# Patient Record
Sex: Female | Born: 1980 | Race: White | Hispanic: No | Marital: Married | Smoking: Former smoker
Health system: Southern US, Community
[De-identification: ages and names within clinical notes are randomized; demographics above are authoritative.]

## PROBLEM LIST (undated history)

## (undated) ENCOUNTER — Emergency Department (HOSPITAL_COMMUNITY)

## (undated) DIAGNOSIS — D649 Anemia, unspecified: Secondary | ICD-10-CM

## (undated) DIAGNOSIS — G709 Myoneural disorder, unspecified: Secondary | ICD-10-CM

## (undated) DIAGNOSIS — I10 Essential (primary) hypertension: Secondary | ICD-10-CM

## (undated) DIAGNOSIS — R32 Unspecified urinary incontinence: Secondary | ICD-10-CM

## (undated) DIAGNOSIS — E041 Nontoxic single thyroid nodule: Secondary | ICD-10-CM

## (undated) DIAGNOSIS — G35 Multiple sclerosis: Secondary | ICD-10-CM

## (undated) DIAGNOSIS — G2581 Restless legs syndrome: Secondary | ICD-10-CM

## (undated) DIAGNOSIS — R112 Nausea with vomiting, unspecified: Secondary | ICD-10-CM

## (undated) DIAGNOSIS — I639 Cerebral infarction, unspecified: Secondary | ICD-10-CM

## (undated) DIAGNOSIS — E039 Hypothyroidism, unspecified: Secondary | ICD-10-CM

## (undated) DIAGNOSIS — R7303 Prediabetes: Secondary | ICD-10-CM

## (undated) DIAGNOSIS — R51 Headache: Secondary | ICD-10-CM

## (undated) DIAGNOSIS — Z9889 Other specified postprocedural states: Secondary | ICD-10-CM

## (undated) DIAGNOSIS — F419 Anxiety disorder, unspecified: Secondary | ICD-10-CM

## (undated) HISTORY — PX: TYMPANOPLASTY: SHX33

## (undated) HISTORY — PX: TONSILLECTOMY: SUR1361

## (undated) HISTORY — PX: ADENOIDECTOMY: SUR15

---
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
## 2002-03-06 ENCOUNTER — Other Ambulatory Visit: Admission: RE | Admit: 2002-03-06 | Discharge: 2002-03-06 | Payer: Self-pay | Admitting: *Deleted

## 2003-10-11 ENCOUNTER — Emergency Department (HOSPITAL_COMMUNITY): Admission: EM | Admit: 2003-10-11 | Discharge: 2003-10-11 | Payer: Self-pay | Admitting: Emergency Medicine

## 2004-08-13 ENCOUNTER — Ambulatory Visit (HOSPITAL_COMMUNITY): Admission: RE | Admit: 2004-08-13 | Discharge: 2004-08-13 | Payer: Self-pay | Admitting: *Deleted

## 2004-08-13 ENCOUNTER — Ambulatory Visit (HOSPITAL_COMMUNITY): Admission: AD | Admit: 2004-08-13 | Discharge: 2004-08-13 | Payer: Self-pay | Admitting: Obstetrics & Gynecology

## 2004-08-29 ENCOUNTER — Ambulatory Visit (HOSPITAL_COMMUNITY): Admission: AD | Admit: 2004-08-29 | Discharge: 2004-08-29 | Payer: Self-pay | Admitting: Obstetrics & Gynecology

## 2004-09-12 ENCOUNTER — Inpatient Hospital Stay (HOSPITAL_COMMUNITY): Admission: RE | Admit: 2004-09-12 | Discharge: 2004-09-14 | Payer: Self-pay | Admitting: Obstetrics & Gynecology

## 2005-02-07 ENCOUNTER — Other Ambulatory Visit: Admission: RE | Admit: 2005-02-07 | Discharge: 2005-02-07 | Payer: Self-pay | Admitting: Obstetrics and Gynecology

## 2006-08-19 ENCOUNTER — Inpatient Hospital Stay (HOSPITAL_COMMUNITY): Admission: RE | Admit: 2006-08-19 | Discharge: 2006-08-21 | Payer: Self-pay | Admitting: Obstetrics and Gynecology

## 2010-06-22 ENCOUNTER — Other Ambulatory Visit (HOSPITAL_COMMUNITY): Payer: Self-pay | Admitting: Family Medicine

## 2010-06-22 DIAGNOSIS — R110 Nausea: Secondary | ICD-10-CM

## 2010-06-22 DIAGNOSIS — R51 Headache: Secondary | ICD-10-CM

## 2010-06-28 ENCOUNTER — Other Ambulatory Visit (HOSPITAL_COMMUNITY): Payer: Self-pay

## 2010-06-28 ENCOUNTER — Ambulatory Visit (HOSPITAL_COMMUNITY)
Admission: RE | Admit: 2010-06-28 | Discharge: 2010-06-28 | Disposition: A | Discharge status: Home / Self Care | Payer: 59 | Source: Ambulatory Visit | Attending: Family Medicine | Admitting: Family Medicine

## 2010-06-28 DIAGNOSIS — R51 Headache: Secondary | ICD-10-CM

## 2010-06-28 DIAGNOSIS — R110 Nausea: Secondary | ICD-10-CM | POA: Insufficient documentation

## 2010-07-02 ENCOUNTER — Emergency Department (HOSPITAL_COMMUNITY)
Admission: EM | Admit: 2010-07-02 | Discharge: 2010-07-02 | Disposition: A | Discharge status: Home / Self Care | Payer: 59 | Attending: Emergency Medicine | Admitting: Emergency Medicine

## 2010-07-02 DIAGNOSIS — R209 Unspecified disturbances of skin sensation: Secondary | ICD-10-CM | POA: Insufficient documentation

## 2010-07-02 DIAGNOSIS — G43909 Migraine, unspecified, not intractable, without status migrainosus: Secondary | ICD-10-CM | POA: Insufficient documentation

## 2010-07-02 DIAGNOSIS — I10 Essential (primary) hypertension: Secondary | ICD-10-CM | POA: Insufficient documentation

## 2010-07-02 DIAGNOSIS — Z79899 Other long term (current) drug therapy: Secondary | ICD-10-CM | POA: Insufficient documentation

## 2011-02-25 ENCOUNTER — Encounter: Payer: Self-pay | Admitting: Emergency Medicine

## 2011-02-25 ENCOUNTER — Emergency Department (HOSPITAL_COMMUNITY)
Admission: EM | Admit: 2011-02-25 | Discharge: 2011-02-25 | Disposition: A | Discharge status: Home / Self Care | Payer: 59 | Attending: Emergency Medicine | Admitting: Emergency Medicine

## 2011-02-25 ENCOUNTER — Emergency Department (HOSPITAL_COMMUNITY): Payer: 59

## 2011-02-25 DIAGNOSIS — Z9889 Other specified postprocedural states: Secondary | ICD-10-CM | POA: Insufficient documentation

## 2011-02-25 DIAGNOSIS — I10 Essential (primary) hypertension: Secondary | ICD-10-CM | POA: Insufficient documentation

## 2011-02-25 DIAGNOSIS — Z79899 Other long term (current) drug therapy: Secondary | ICD-10-CM | POA: Insufficient documentation

## 2011-02-25 DIAGNOSIS — R630 Anorexia: Secondary | ICD-10-CM | POA: Insufficient documentation

## 2011-02-25 DIAGNOSIS — R1013 Epigastric pain: Secondary | ICD-10-CM | POA: Insufficient documentation

## 2011-02-25 DIAGNOSIS — R10819 Abdominal tenderness, unspecified site: Secondary | ICD-10-CM | POA: Insufficient documentation

## 2011-02-25 HISTORY — DX: Essential (primary) hypertension: I10

## 2011-02-25 LAB — URINALYSIS, ROUTINE W REFLEX MICROSCOPIC
Bilirubin Urine: NEGATIVE
Glucose, UA: NEGATIVE mg/dL
Ketones, ur: NEGATIVE mg/dL
Nitrite: NEGATIVE
Protein, ur: NEGATIVE mg/dL
Specific Gravity, Urine: 1.01 (ref 1.005–1.030)
Urobilinogen, UA: 1 mg/dL (ref 0.0–1.0)
pH: 7.5 (ref 5.0–8.0)

## 2011-02-25 LAB — DIFFERENTIAL
Basophils Absolute: 0 10*3/uL (ref 0.0–0.1)
Eosinophils Relative: 2 % (ref 0–5)
Lymphocytes Relative: 24 % (ref 12–46)
Lymphs Abs: 1.2 10*3/uL (ref 0.7–4.0)
Neutrophils Relative %: 67 % (ref 43–77)

## 2011-02-25 LAB — COMPREHENSIVE METABOLIC PANEL
ALT: 11 U/L (ref 0–35)
AST: 16 U/L (ref 0–37)
Alkaline Phosphatase: 58 U/L (ref 39–117)
CO2: 27 mEq/L (ref 19–32)
Calcium: 9 mg/dL (ref 8.4–10.5)
Glucose, Bld: 88 mg/dL (ref 70–99)
Potassium: 3.9 mEq/L (ref 3.5–5.1)
Sodium: 140 mEq/L (ref 135–145)
Total Protein: 7.3 g/dL (ref 6.0–8.3)

## 2011-02-25 LAB — URINE MICROSCOPIC-ADD ON

## 2011-02-25 LAB — CBC
MCV: 85.5 fL (ref 78.0–100.0)
Platelets: 221 10*3/uL (ref 150–400)
RBC: 4.42 MIL/uL (ref 3.87–5.11)
RDW: 13.1 % (ref 11.5–15.5)
WBC: 5 10*3/uL (ref 4.0–10.5)

## 2011-02-25 LAB — POCT PREGNANCY, URINE: Preg Test, Ur: NEGATIVE

## 2011-02-25 MED ORDER — OMEPRAZOLE 20 MG PO CPDR: 20.0000 mg | DELAYED_RELEASE_CAPSULE | Freq: Every day | ORAL | Status: DC

## 2011-02-25 NOTE — ED Notes (Signed)
Pt reports abdominal pain x 1 month. Pt has been taking ibuprophen for migraines. Now pt has sensation of fullness in abdomen.

## 2011-02-25 NOTE — ED Provider Notes (Signed)
History     CSN: [id number] Arrival date & time: 02/25/2011 10:03 AM   First MD Initiated Contact with Patient 02/25/11 1135      Chief Complaint  Patient presents with  . Abdominal Pain    (Consider location/radiation/quality/duration/timing/severity/associated sxs/prior treatment) Patient is a 30 y.o. female presenting with abdominal pain. The history is provided by the patient.  Abdominal Pain The primary symptoms of the illness include abdominal pain. The primary symptoms of the illness do not include fever, fatigue, shortness of breath, nausea, vomiting, diarrhea, hematemesis, hematochezia, dysuria, vaginal discharge or vaginal bleeding. The current episode started more than 2 days ago. The problem has been gradually worsening.  Additional symptoms associated with the illness include anorexia. Symptoms associated with the illness do not include chills, diaphoresis, heartburn, urgency, hematuria, frequency or back pain. Significant associated medical issues do not include GERD.  Patient has had abdominal pain for approximately the last month. This seems to be located in the upper abdomen. It is described as a sharp sensation. It does not radiate. It is aggravated by lying down at night and is not changed with food consumption. She has not noticed any particular alleviating factors. She does seem to have a decrease in appetite.  She's had no accompanying nausea, vomiting, change in bowel movements, blood in stool, or rectal pain. She denies any fever, dysuria or vaginal discharge. She denies chest pain, shortness of breath, palpitations. She denies any chance that she may be pregnant, as her partner has had a vasectomy. She does not have any known history of GERD, but does recall having frequent heartburn while in the third trimester of her pregnancies. She states that her current symptoms don't really feel like heartburn to her. She denies any history of abdominal surgery other than  C-sections.  Past Medical History  Diagnosis Date  . Hypertension     Past Surgical History  Procedure Date  . Cesarean section   . Tympanoplasty     No family history on file.  History  Substance Use Topics  . Smoking status: Never Smoker   . Smokeless tobacco: Not on file  . Alcohol Use: Yes    OB History    Grav Para Term Preterm Abortions TAB SAB Ect Mult Living                  Review of Systems  Constitutional: Negative for fever, chills, diaphoresis and fatigue.  Respiratory: Negative for shortness of breath.   Gastrointestinal: Positive for abdominal pain and anorexia. Negative for heartburn, nausea, vomiting, diarrhea, hematochezia and hematemesis.  Genitourinary: Negative for dysuria, urgency, frequency, hematuria, vaginal bleeding and vaginal discharge.  Musculoskeletal: Negative for back pain.  ROS negative except as noted in HPI.  Allergies  Keflex and Penicillins  Home Medications   Current Outpatient Rx  Name Route Sig Dispense Refill  . ALPRAZOLAM 0.25 MG PO TABS Oral Take 0.25 mg by mouth daily as needed. For anxiety; patient takes along with Ibuprofen and Zofran at onset of migraine     . IBUPROFEN 800 MG PO TABS Oral Take 800 mg by mouth every 8 (eight) hours as needed. For pain; takes along with Xanax and Zofran at onset of migraine    . ONDANSETRON HCL 8 MG PO TABS Oral Take 8 mg by mouth every 8 (eight) hours as needed. For nausea; takes along with Ibuprofen and Xanax at onset of migraine      BP 127/82  Pulse 68  Temp(Src) 97 F (  36.1 C) (Oral)  SpO2 100%  LMP 02/21/2011  Physical Exam  Nursing note and vitals reviewed. Constitutional: She is oriented to person, place, and time. She appears well-developed and well-nourished. No distress.  HENT:  Head: Normocephalic and atraumatic.  Right Ear: External ear normal.  Left Ear: External ear normal.  Mouth/Throat: Oropharynx is clear and moist. No oropharyngeal exudate.  Eyes:  Conjunctivae and EOM are normal. Pupils are equal, round, and reactive to light.  Neck: Normal range of motion. Neck supple.  Cardiovascular: Normal rate, regular rhythm and normal heart sounds.   Pulmonary/Chest: Effort normal and breath sounds normal. She has no wheezes. She exhibits no tenderness.  Abdominal: Soft. Bowel sounds are normal. She exhibits no distension and no mass. There is tenderness. There is no rebound and no guarding.       Mild tenderness to epigastrium and LUQ.  Musculoskeletal: Normal range of motion.  Neurological: She is alert and oriented to person, place, and time.  Skin: Skin is warm and dry. No rash noted. She is not diaphoretic.  Psychiatric: She has a normal mood and affect.    ED Course  Procedures (including critical care time)  Labs Reviewed  URINALYSIS, ROUTINE W REFLEX MICROSCOPIC - Abnormal; Notable for the following:    Hgb urine dipstick LARGE (*)    Leukocytes, UA SMALL (*)    All other components within normal limits  POCT PREGNANCY, URINE  CBC  DIFFERENTIAL  COMPREHENSIVE METABOLIC PANEL  LIPASE, BLOOD  URINE MICROSCOPIC-ADD ON  LAB REPORT - SCANNED   US Abdomen Complete  02/25/2011  *RADIOLOGY REPORT*  Clinical Data:  Abdominal pain times 1 month, epigastric region  COMPLETE ABDOMINAL ULTRASOUND  Comparison:  None.  Findings:  Gallbladder:  No gallstones, gallbladder wall thickening, or pericholecystic fluid.  Negative sonographic Murphy's sign  Common bile duct:  Normal in size, measuring 4 mm in diameter  Liver:  Homogeneous hepatic echotexture.  No discrete lesions.  No ascites.  IVC:  Appears normal.  Pancreas:  No focal abnormality seen.  Spleen:  Normal in size measuring 10.4 cm in length.  Several small accessory splenules are incidentally noted.  Right Kidney:  Normal cortical thickness, echogenicity and size, measuring 11.6 cm in length.  No focal renal lesions.  No echogenic renal stones.  No urinary obstruction.  Left Kidney:   Normal cortical thickness, echogenicity and size, measuring 11 cm in length.  No focal renal lesions.  No echogenic renal stones.  No urinary obstruction.  Abdominal aorta:  No aneurysm identified.  IMPRESSION: Negative abdominal ultrasound.  Specifically, no evidence of cholelithiasis or urinary obstruction.  Original Report Authenticated By: Waynard Reeds, M.D.     1. Abdominal pain       MDM   Well-appearing female presents with one month of abdominal pain. The pain is located in the epigastrium. Labs performed today were largely unremarkable. An ultrasound was obtained to rule out gallbladder pathology, and was normal. Based on her presentation, I did not think a CT of the abdomen would add to the clinical picture at this time. I do not suspect an acute abdomen based on her presentation. Will treat for possible GERD with omeprazole. She was instructed to followup with her primary care physician for further evaluation and management. Patient verbalized understanding and agreed to plan.        Grant Fontana, [place] 02/26/11 2239

## 2011-02-25 NOTE — ED Notes (Signed)
To ultrasound

## 2011-02-25 NOTE — ED Notes (Signed)
Patient verbalized understanding of discharge instructions and is ambulatory on discharge

## 2011-02-27 NOTE — ED Provider Notes (Signed)
Medical screening examination/treatment/procedure(s) were performed by non-physician practitioner and as supervising physician I was immediately available for consultation/collaboration.   Gerhard Munch, MD 02/27/11 2155

## 2011-05-21 ENCOUNTER — Other Ambulatory Visit: Payer: Self-pay | Admitting: Family Medicine

## 2011-05-21 DIAGNOSIS — E049 Nontoxic goiter, unspecified: Secondary | ICD-10-CM

## 2011-05-22 ENCOUNTER — Ambulatory Visit (HOSPITAL_COMMUNITY)
Admission: RE | Admit: 2011-05-22 | Discharge: 2011-05-22 | Disposition: A | Discharge status: Home / Self Care | Payer: 59 | Source: Ambulatory Visit | Attending: Family Medicine | Admitting: Family Medicine

## 2011-05-22 DIAGNOSIS — E049 Nontoxic goiter, unspecified: Secondary | ICD-10-CM | POA: Insufficient documentation

## 2011-05-30 ENCOUNTER — Other Ambulatory Visit: Payer: Self-pay | Admitting: Family Medicine

## 2011-05-30 DIAGNOSIS — E041 Nontoxic single thyroid nodule: Secondary | ICD-10-CM

## 2011-06-05 ENCOUNTER — Other Ambulatory Visit (HOSPITAL_COMMUNITY)
Admission: RE | Admit: 2011-06-05 | Discharge: 2011-06-05 | Disposition: A | Discharge status: Home / Self Care | Payer: 59 | Source: Ambulatory Visit | Attending: Interventional Radiology | Admitting: Interventional Radiology

## 2011-06-05 ENCOUNTER — Ambulatory Visit
Admission: RE | Admit: 2011-06-05 | Discharge: 2011-06-05 | Disposition: A | Discharge status: Home / Self Care | Payer: 59 | Source: Ambulatory Visit | Attending: Family Medicine | Admitting: Family Medicine

## 2011-06-05 DIAGNOSIS — E049 Nontoxic goiter, unspecified: Secondary | ICD-10-CM | POA: Insufficient documentation

## 2011-06-05 DIAGNOSIS — E041 Nontoxic single thyroid nodule: Secondary | ICD-10-CM

## 2011-06-14 ENCOUNTER — Encounter (HOSPITAL_COMMUNITY): Payer: Self-pay | Admitting: Pharmacy Technician

## 2011-06-15 ENCOUNTER — Encounter (HOSPITAL_COMMUNITY): Payer: Self-pay | Admitting: *Deleted

## 2011-06-15 ENCOUNTER — Other Ambulatory Visit (HOSPITAL_COMMUNITY): Payer: Self-pay | Admitting: Otolaryngology

## 2011-06-18 ENCOUNTER — Encounter (HOSPITAL_COMMUNITY): Payer: Self-pay | Admitting: Anesthesiology

## 2011-06-18 ENCOUNTER — Observation Stay (HOSPITAL_COMMUNITY)
Admission: RE | Admit: 2011-06-18 | Discharge: 2011-06-19 | Disposition: A | Discharge status: Home / Self Care | Payer: 59 | Source: Ambulatory Visit | Attending: Otolaryngology | Admitting: Otolaryngology

## 2011-06-18 ENCOUNTER — Other Ambulatory Visit: Payer: Self-pay

## 2011-06-18 ENCOUNTER — Ambulatory Visit (HOSPITAL_COMMUNITY): Payer: 59 | Admitting: Anesthesiology

## 2011-06-18 ENCOUNTER — Encounter (HOSPITAL_COMMUNITY): Payer: Self-pay | Admitting: Surgery

## 2011-06-18 ENCOUNTER — Encounter (HOSPITAL_COMMUNITY)
Admission: RE | Disposition: A | Discharge status: Home / Self Care | Payer: Self-pay | Source: Ambulatory Visit | Attending: Otolaryngology

## 2011-06-18 ENCOUNTER — Ambulatory Visit (HOSPITAL_COMMUNITY): Payer: 59

## 2011-06-18 ENCOUNTER — Encounter (HOSPITAL_COMMUNITY): Payer: Self-pay | Admitting: *Deleted

## 2011-06-18 DIAGNOSIS — L299 Pruritus, unspecified: Secondary | ICD-10-CM | POA: Insufficient documentation

## 2011-06-18 DIAGNOSIS — Z79899 Other long term (current) drug therapy: Secondary | ICD-10-CM | POA: Insufficient documentation

## 2011-06-18 DIAGNOSIS — Z01818 Encounter for other preprocedural examination: Secondary | ICD-10-CM | POA: Insufficient documentation

## 2011-06-18 DIAGNOSIS — F411 Generalized anxiety disorder: Secondary | ICD-10-CM | POA: Insufficient documentation

## 2011-06-18 DIAGNOSIS — I10 Essential (primary) hypertension: Secondary | ICD-10-CM | POA: Insufficient documentation

## 2011-06-18 DIAGNOSIS — E041 Nontoxic single thyroid nodule: Secondary | ICD-10-CM

## 2011-06-18 DIAGNOSIS — Z01810 Encounter for preprocedural cardiovascular examination: Secondary | ICD-10-CM | POA: Insufficient documentation

## 2011-06-18 DIAGNOSIS — E063 Autoimmune thyroiditis: Principal | ICD-10-CM | POA: Insufficient documentation

## 2011-06-18 DIAGNOSIS — D34 Benign neoplasm of thyroid gland: Secondary | ICD-10-CM | POA: Insufficient documentation

## 2011-06-18 DIAGNOSIS — Z01812 Encounter for preprocedural laboratory examination: Secondary | ICD-10-CM | POA: Insufficient documentation

## 2011-06-18 HISTORY — DX: Nausea with vomiting, unspecified: R11.2

## 2011-06-18 HISTORY — DX: Nontoxic single thyroid nodule: E04.1

## 2011-06-18 HISTORY — PX: THYROIDECTOMY: SHX17

## 2011-06-18 HISTORY — DX: Other specified postprocedural states: Z98.890

## 2011-06-18 HISTORY — DX: Headache: R51

## 2011-06-18 HISTORY — DX: Myoneural disorder, unspecified: G70.9

## 2011-06-18 HISTORY — DX: Anxiety disorder, unspecified: F41.9

## 2011-06-18 LAB — BASIC METABOLIC PANEL
CO2: 27 mEq/L (ref 19–32)
Calcium: 9.2 mg/dL (ref 8.4–10.5)
Creatinine, Ser: 0.69 mg/dL (ref 0.50–1.10)
GFR calc non Af Amer: >90 mL/min (ref >90)
Glucose, Bld: 97 mg/dL (ref 70–99)
Sodium: 139 mEq/L (ref 135–145)

## 2011-06-18 LAB — CBC
MCH: 28.5 pg (ref 26.0–34.0)
MCV: 84.2 fL (ref 78.0–100.0)
Platelets: 220 10*3/uL (ref 150–400)
RBC: 4.49 MIL/uL (ref 3.87–5.11)
RDW: 12.8 % (ref 11.5–15.5)
WBC: 6 10*3/uL (ref 4.0–10.5)

## 2011-06-18 LAB — SURGICAL PCR SCREEN: Staphylococcus aureus: POSITIVE — AB

## 2011-06-18 LAB — HCG, SERUM, QUALITATIVE: Preg, Serum: NEGATIVE

## 2011-06-18 SURGERY — THYROIDECTOMY
Anesthesia: General | Site: Neck | Laterality: Right | Wound class: Clean

## 2011-06-18 MED ORDER — MUPIROCIN 2 % EX OINT
TOPICAL_OINTMENT | CUTANEOUS | Status: AC
  Filled 2011-06-18: qty 22

## 2011-06-18 MED ORDER — DIPHENHYDRAMINE HCL 50 MG/ML IJ SOLN
25.0000 mg | Freq: Four times a day (QID) | INTRAMUSCULAR | Status: DC | PRN
  Administered 2011-06-18: 25 mg via INTRAVENOUS

## 2011-06-18 MED ORDER — MORPHINE SULFATE 4 MG/ML IJ SOLN: 2.0000 mg | INTRAMUSCULAR | Status: DC | PRN

## 2011-06-18 MED ORDER — CLINDAMYCIN PHOSPHATE 600 MG/50ML IV SOLN
600.0000 mg | Freq: Three times a day (TID) | INTRAVENOUS | Status: DC
  Administered 2011-06-18 – 2011-06-19 (×3): 600 mg via INTRAVENOUS
  Filled 2011-06-18 (×4): qty 50

## 2011-06-18 MED ORDER — KCL IN DEXTROSE-NACL 20-5-0.45 MEQ/L-%-% IV SOLN
INTRAVENOUS | Status: DC
  Administered 2011-06-18: 16:00:00 via INTRAVENOUS
  Filled 2011-06-18 (×4): qty 1000

## 2011-06-18 MED ORDER — NEOSTIGMINE METHYLSULFATE 1 MG/ML IJ SOLN
INTRAMUSCULAR | Status: DC | PRN
  Administered 2011-06-18: 3 mg via INTRAVENOUS

## 2011-06-18 MED ORDER — CHLORHEXIDINE GLUCONATE CLOTH 2 % EX PADS
6.0000 | MEDICATED_PAD | Freq: Every day | CUTANEOUS | Status: DC
  Administered 2011-06-19: 12 via TOPICAL

## 2011-06-18 MED ORDER — PROPOFOL 10 MG/ML IV EMUL
INTRAVENOUS | Status: DC | PRN
  Administered 2011-06-18: 200 mg via INTRAVENOUS

## 2011-06-18 MED ORDER — DIPHENHYDRAMINE HCL 25 MG PO CAPS
25.0000 mg | ORAL_CAPSULE | Freq: Four times a day (QID) | ORAL | Status: DC | PRN
  Administered 2011-06-19 (×2): 25 mg via ORAL
  Filled 2011-06-18 (×2): qty 1

## 2011-06-18 MED ORDER — HYDROMORPHONE HCL PF 1 MG/ML IJ SOLN
0.2500 mg | INTRAMUSCULAR | Status: DC | PRN
  Administered 2011-06-18 (×4): 0.5 mg via INTRAVENOUS

## 2011-06-18 MED ORDER — CEFAZOLIN SODIUM 1-5 GM-% IV SOLN
INTRAVENOUS | Status: DC | PRN
  Administered 2011-06-18: 1 g via INTRAVENOUS

## 2011-06-18 MED ORDER — 0.9 % SODIUM CHLORIDE (POUR BTL) OPTIME
TOPICAL | Status: DC | PRN
  Administered 2011-06-18: 1000 mL

## 2011-06-18 MED ORDER — ALPRAZOLAM 0.25 MG PO TABS: 0.2500 mg | ORAL_TABLET | Freq: Every day | ORAL | Status: DC | PRN

## 2011-06-18 MED ORDER — ROCURONIUM BROMIDE 100 MG/10ML IV SOLN
INTRAVENOUS | Status: DC | PRN
  Administered 2011-06-18: 50 mg via INTRAVENOUS

## 2011-06-18 MED ORDER — HYDROCODONE-ACETAMINOPHEN 5-325 MG PO TABS
1.0000 | ORAL_TABLET | ORAL | Status: DC | PRN
  Administered 2011-06-18: 1 via ORAL
  Administered 2011-06-18 – 2011-06-19 (×3): 2 via ORAL
  Filled 2011-06-18 (×2): qty 2
  Filled 2011-06-18: qty 1
  Filled 2011-06-18: qty 2

## 2011-06-18 MED ORDER — ONDANSETRON HCL 4 MG/2ML IJ SOLN: 4.0000 mg | INTRAMUSCULAR | Status: DC | PRN

## 2011-06-18 MED ORDER — LIDOCAINE-EPINEPHRINE 1 %-1:100000 IJ SOLN
INTRAMUSCULAR | Status: DC | PRN
  Administered 2011-06-18: 4.5 mL

## 2011-06-18 MED ORDER — MUPIROCIN 2 % EX OINT
1.0000 "application " | TOPICAL_OINTMENT | Freq: Two times a day (BID) | CUTANEOUS | Status: DC
  Administered 2011-06-18 – 2011-06-19 (×2): 1 via NASAL
  Filled 2011-06-18: qty 22

## 2011-06-18 MED ORDER — FENTANYL CITRATE 0.05 MG/ML IJ SOLN
INTRAMUSCULAR | Status: DC | PRN
  Administered 2011-06-18: 50 ug via INTRAVENOUS
  Administered 2011-06-18 (×2): 100 ug via INTRAVENOUS

## 2011-06-18 MED ORDER — CEFAZOLIN SODIUM 1-5 GM-% IV SOLN
INTRAVENOUS | Status: AC
  Filled 2011-06-18: qty 50

## 2011-06-18 MED ORDER — ONDANSETRON HCL 4 MG/2ML IJ SOLN
INTRAMUSCULAR | Status: DC | PRN
  Administered 2011-06-18: 4 mg via INTRAVENOUS

## 2011-06-18 MED ORDER — HYDROMORPHONE HCL PF 1 MG/ML IJ SOLN
INTRAMUSCULAR | Status: AC
  Filled 2011-06-18: qty 1

## 2011-06-18 MED ORDER — LACTATED RINGERS IV SOLN
INTRAVENOUS | Status: DC | PRN
  Administered 2011-06-18 (×2): via INTRAVENOUS

## 2011-06-18 MED ORDER — DIPHENHYDRAMINE HCL 50 MG/ML IJ SOLN
INTRAMUSCULAR | Status: AC
  Administered 2011-06-18: 25 mg via INTRAVENOUS
  Filled 2011-06-18: qty 1

## 2011-06-18 MED ORDER — ONDANSETRON HCL 4 MG/2ML IJ SOLN
4.0000 mg | Freq: Four times a day (QID) | INTRAMUSCULAR | Status: AC | PRN
  Administered 2011-06-18: 4 mg via INTRAVENOUS

## 2011-06-18 MED ORDER — MUPIROCIN 2 % EX OINT
TOPICAL_OINTMENT | CUTANEOUS | Status: AC
  Administered 2011-06-18: 1 via NASAL
  Filled 2011-06-18: qty 22

## 2011-06-18 MED ORDER — MIDAZOLAM HCL 5 MG/5ML IJ SOLN
INTRAMUSCULAR | Status: DC | PRN
  Administered 2011-06-18: 2 mg via INTRAVENOUS

## 2011-06-18 MED ORDER — ONDANSETRON HCL 4 MG PO TABS: 4.0000 mg | ORAL_TABLET | ORAL | Status: DC | PRN

## 2011-06-18 MED ORDER — GLYCOPYRROLATE 0.2 MG/ML IJ SOLN
INTRAMUSCULAR | Status: DC | PRN
  Administered 2011-06-18: .6 mg via INTRAVENOUS

## 2011-06-18 SURGICAL SUPPLY — 57 items
APPLIER CLIP 9.375 SM OPEN (CLIP) ×4
ATTRACTOMAT 16X20 MAGNETIC DRP (DRAPES) IMPLANT
BENZOIN TINCTURE PRP APPL 2/3 (GAUZE/BANDAGES/DRESSINGS) IMPLANT
BLADE SURG 15 STRL LF DISP TIS (BLADE) ×1 IMPLANT
BLADE SURG 15 STRL SS (BLADE) ×1
CANISTER SUCTION 2500CC (MISCELLANEOUS) ×2 IMPLANT
CLEANER TIP ELECTROSURG 2X2 (MISCELLANEOUS) ×2 IMPLANT
CLIP APPLIE 9.375 SM OPEN (CLIP) ×2 IMPLANT
CLOTH BEACON ORANGE TIMEOUT ST (SAFETY) ×2 IMPLANT
CONT SPEC 4OZ CLIKSEAL STRL BL (MISCELLANEOUS) ×2 IMPLANT
CORDS BIPOLAR (ELECTRODE) ×2 IMPLANT
COVER SURGICAL LIGHT HANDLE (MISCELLANEOUS) ×2 IMPLANT
CRADLE DONUT ADULT HEAD (MISCELLANEOUS) ×2 IMPLANT
DECANTER SPIKE VIAL GLASS SM (MISCELLANEOUS) ×2 IMPLANT
DERMABOND ADVANCED (GAUZE/BANDAGES/DRESSINGS) ×1
DERMABOND ADVANCED .7 DNX12 (GAUZE/BANDAGES/DRESSINGS) ×1 IMPLANT
DRAIN JACKSON RD 7FR 3/32 (WOUND CARE) ×2 IMPLANT
DRAIN SNY 10 ROU (WOUND CARE) IMPLANT
ELECT COATED BLADE 2.86 ST (ELECTRODE) ×2 IMPLANT
ELECT REM PT RETURN 9FT ADLT (ELECTROSURGICAL) ×2
ELECTRODE REM PT RTRN 9FT ADLT (ELECTROSURGICAL) ×1 IMPLANT
EVACUATOR SILICONE 100CC (DRAIN) ×2 IMPLANT
GAUZE SPONGE 4X4 16PLY XRAY LF (GAUZE/BANDAGES/DRESSINGS) IMPLANT
GLOVE BIO SURGEON STRL SZ 6 (GLOVE) ×2 IMPLANT
GLOVE BIO SURGEON STRL SZ 6.5 (GLOVE) ×2 IMPLANT
GLOVE BIO SURGEON STRL SZ7 (GLOVE) ×2 IMPLANT
GLOVE BIO SURGEON STRL SZ7.5 (GLOVE) ×2 IMPLANT
GLOVE BIOGEL PI IND STRL 6 (GLOVE) ×2 IMPLANT
GLOVE BIOGEL PI IND STRL 6.5 (GLOVE) ×1 IMPLANT
GLOVE BIOGEL PI IND STRL 7.5 (GLOVE) ×1 IMPLANT
GLOVE BIOGEL PI INDICATOR 6 (GLOVE) ×2
GLOVE BIOGEL PI INDICATOR 6.5 (GLOVE) ×1
GLOVE BIOGEL PI INDICATOR 7.5 (GLOVE) ×1
GLOVE SURG SS PI 6.5 STRL IVOR (GLOVE) ×2 IMPLANT
GLOVE SURG SS PI 7.5 STRL IVOR (GLOVE) ×2 IMPLANT
GOWN STRL NON-REIN LRG LVL3 (GOWN DISPOSABLE) ×14 IMPLANT
KIT BASIN OR (CUSTOM PROCEDURE TRAY) ×2 IMPLANT
KIT ROOM TURNOVER OR (KITS) ×2 IMPLANT
LOCATOR NERVE 3 VOLT (DISPOSABLE) IMPLANT
NS IRRIG 1000ML POUR BTL (IV SOLUTION) ×2 IMPLANT
PAD ARMBOARD 7.5X6 YLW CONV (MISCELLANEOUS) ×4 IMPLANT
PENCIL BUTTON HOLSTER BLD 10FT (ELECTRODE) ×2 IMPLANT
SLEEVE SURGEON STRL (DRAPES) ×2 IMPLANT
SPECIMEN JAR MEDIUM (MISCELLANEOUS) IMPLANT
SPONGE INTESTINAL PEANUT (DISPOSABLE) IMPLANT
STAPLER VISISTAT 35W (STAPLE) ×2 IMPLANT
SUT ETHILON 2 0 FS 18 (SUTURE) ×2 IMPLANT
SUT MNCRL AB 4-0 PS2 18 (SUTURE) IMPLANT
SUT SILK 3 0 REEL (SUTURE) ×4 IMPLANT
SUT VIC AB 3-0 SH 27 (SUTURE) ×1
SUT VIC AB 3-0 SH 27X BRD (SUTURE) ×1 IMPLANT
SUT VICRYL 4-0 PS2 18IN ABS (SUTURE) ×4 IMPLANT
TOWEL OR 17X24 6PK STRL BLUE (TOWEL DISPOSABLE) ×2 IMPLANT
TOWEL OR 17X26 10 PK STRL BLUE (TOWEL DISPOSABLE) ×2 IMPLANT
TRAY ENT MC OR (CUSTOM PROCEDURE TRAY) ×2 IMPLANT
TRAY FOLEY CATH 14FRSI W/METER (CATHETERS) IMPLANT
WATER STERILE IRR 1000ML POUR (IV SOLUTION) ×2 IMPLANT

## 2011-06-18 NOTE — Transfer of Care (Signed)
Immediate Anesthesia Transfer of Care Note  Patient: Lisa [name]  Procedure(s) Performed: Procedure(s) (LRB): THYROIDECTOMY (Right)  Patient Location: PACU  Anesthesia Type: General  Level of Consciousness: awake  Airway & Oxygen Therapy: Patient Spontanous Breathing and Patient connected to nasal cannula oxygen  Post-op Assessment: Report given to PACU RN and Post -op Vital signs reviewed and stable  Post vital signs: Reviewed and stable  Complications: No apparent anesthesia complications

## 2011-06-18 NOTE — Preoperative (Addendum)
Beta Blockers   Reason not to administer Beta Blockers:Not Applicable 

## 2011-06-18 NOTE — Brief Op Note (Signed)
06/18/2011  10:34 AM  PATIENT:  Allen Kell Pavel  31 y.o. female  PRE-OPERATIVE DIAGNOSIS:  RIGHT THYROID NODULE  POST-OPERATIVE DIAGNOSIS:  RIGHT THYROID NODULE  PROCEDURE:  Procedure(s) (LRB): THYROIDECTOMY (Right)  SURGEON:  Surgeon(s) and Role:    * Christia Reading, MD - Primary  PHYSICIAN ASSISTANT: Aquilla Hacker  ASSISTANTS: none   ANESTHESIA:   general  EBL:  Total I/O In: 1000 [I.V.:1000] Out: -   BLOOD ADMINISTERED:none  DRAINS: (7 Fr) Jackson-Pratt drain(s) with closed bulb suction in the neck   LOCAL MEDICATIONS USED:  LIDOCAINE   SPECIMEN:  Source of Specimen:  Right thyroid lobe  DISPOSITION OF SPECIMEN:  PATHOLOGY  COUNTS:  YES  TOURNIQUET:  * No tourniquets in log *  DICTATION: .Other Dictation: Dictation Number [id number]  PLAN OF CARE: Admit for overnight observation  PATIENT DISPOSITION:  PACU - hemodynamically stable.   Delay start of Pharmacological VTE agent (>24hrs) due to surgical blood loss or risk of bleeding: no

## 2011-06-18 NOTE — Anesthesia Preprocedure Evaluation (Signed)
Anesthesia Evaluation  Patient identified by MRN, date of birth, ID band Patient awake    Reviewed: Allergy & Precautions, H&P , NPO status , Patient's Chart, lab work & pertinent test results  Airway Mallampati: II  Neck ROM: full    Dental   Pulmonary former smoker         Cardiovascular hypertension,     Neuro/Psych  Headaches, Anxiety  Neuromuscular disease    GI/Hepatic   Endo/Other    Renal/GU      Musculoskeletal   Abdominal   Peds  Hematology   Anesthesia Other Findings   Reproductive/Obstetrics                           Anesthesia Physical Anesthesia Plan  ASA: II  Anesthesia Plan: General   Post-op Pain Management:    Induction: Intravenous  Airway Management Planned: Oral ETT  Additional Equipment:   Intra-op Plan:   Post-operative Plan: Extubation in OR  Informed Consent: I have reviewed the patients History and Physical, chart, labs and discussed the procedure including the risks, benefits and alternatives for the proposed anesthesia with the patient or authorized representative who has indicated his/her understanding and acceptance.     Plan Discussed with: CRNA and Surgeon  Anesthesia Plan Comments:         Anesthesia Quick Evaluation

## 2011-06-18 NOTE — Anesthesia Postprocedure Evaluation (Signed)
Anesthesia Post Note  Patient: Lisa [name]  Procedure(s) Performed: Procedure(s) (LRB): THYROIDECTOMY (Right)  Anesthesia type: General  Patient location: PACU  Post pain: Pain level controlled and Adequate analgesia  Post assessment: Post-op Vital signs reviewed, Patient's Cardiovascular Status Stable, Respiratory Function Stable, Patent Airway and Pain level controlled  Last Vitals:  Filed Vitals:   06/18/11 1050  BP:   Pulse:   Temp: 36.9 C  Resp:     Post vital signs: Reviewed and stable  Level of consciousness: awake, alert  and oriented  Complications: No apparent anesthesia complications

## 2011-06-18 NOTE — H&P (Signed)
Lisa [name] is an 31 y.o. female.   Chief Complaint: Right thyroid nodule HPI: Had MRI for headaches that showed a 1.8 cm nodule in right thyroid lobe.  FNA was inconclusive.  Patient wishes to proceed with excisional biopsy.  Past Medical History  Diagnosis Date  . Hypertension   . Anxiety   . Headache   . Thyroid nodule   . Neuromuscular disorder     sus  . PONV (postoperative nausea and vomiting)     Past Surgical History  Procedure Date  . Cesarean section   . Tympanoplasty   . Cesarean section   . Tonsillectomy     History reviewed. No pertinent family history. Social History:  reports that she has quit smoking. She does not have any smokeless tobacco history on file. She reports that she drinks alcohol. She reports that she does not use illicit drugs.  Allergies:  Allergies  Allergen Reactions  . Darvocet (Propoxyphene N-Acetaminophen) Other (See Comments)    Hallucination  . Keflex Rash  . Penicillins Rash    Medications Prior to Admission  Medication Dose Route Frequency Provider Last Rate Last Dose  . mupirocin ointment (BACTROBAN) 2 %        1 application at 06/18/11 0655  . DISCONTD: mupirocin ointment (BACTROBAN) 2 %            Medications Prior to Admission  Medication Sig Dispense Refill  . ALPRAZolam (XANAX) 0.25 MG tablet Take 0.25 mg by mouth daily as needed. For anxiety; patient takes along with Ibuprofen and Zofran at onset of migraine       . ibuprofen (ADVIL,MOTRIN) 800 MG tablet Take 800 mg by mouth every 8 (eight) hours as needed. For pain; takes along with Xanax and Zofran at onset of migraine      . ondansetron (ZOFRAN) 8 MG tablet Take 8 mg by mouth every 8 (eight) hours as needed. For nausea; takes along with Ibuprofen and Xanax at onset of migraine        Results for orders placed during the hospital encounter of 06/18/11 (from the past 48 hour(s))  CBC     Status: Normal   Collection Time   06/18/11  6:55 AM      Component Value  Range Comment   WBC 6.0  4.0 - 10.5 (K/uL)    RBC 4.49  3.87 - 5.11 (MIL/uL)    Hemoglobin 12.8  12.0 - 15.0 (g/dL)    HCT 16.1  09.6 - 04.5 (%)    MCV 84.2  78.0 - 100.0 (fL)    MCH 28.5  26.0 - 34.0 (pg)    MCHC 33.9  30.0 - 36.0 (g/dL)    RDW 40.9  81.1 - 91.4 (%)    Platelets 220  150 - 400 (K/uL)   HCG, SERUM, QUALITATIVE     Status: Normal   Collection Time   06/18/11  6:55 AM      Component Value Range Comment   Preg, Serum NEGATIVE  NEGATIVE    BASIC METABOLIC PANEL     Status: Normal   Collection Time   06/18/11  6:55 AM      Component Value Range Comment   Sodium 139  135 - 145 (mEq/L)    Potassium 4.1  3.5 - 5.1 (mEq/L)    Chloride 105  96 - 112 (mEq/L)    CO2 27  19 - 32 (mEq/L)    Glucose, Bld 97  70 - 99 (mg/dL)  BUN 11  6 - 23 (mg/dL)    Creatinine, Ser 4.54  0.50 - 1.10 (mg/dL)    Calcium 9.2  8.4 - 10.5 (mg/dL)    GFR calc non Af Amer >90  >90 (mL/min)    GFR calc Af Amer >90  >90 (mL/min)    Dg Chest 2 View  06/18/2011  *RADIOLOGY REPORT*  Clinical Data: Preoperative respiratory exam for thyroid surgery. History of hypertension and smoking.  CHEST - 2 VIEW  Comparison: None.  Findings: Heart size is at the upper limits of normal.  Mediastinal shadows are normal.  Lungs are clear.  No effusions.  No bony abnormalities.  IMPRESSION: Normal chest.  Heart at the upper limits of normal in size.  Original Report Authenticated By: Thomasenia Sales, M.D.    Review of Systems  All other systems reviewed and are negative.    Blood pressure 122/83, pulse 77, temperature 98.4 F (36.9 C), temperature source Oral, resp. rate 20, height 5\' 9"  (1.753 m), weight 88.451 kg (195 lb), last menstrual period 06/11/2011, SpO2 96.00%. Physical Exam  Constitutional: She is oriented to person, place, and time. She appears well-developed and well-nourished.  HENT:  Head: Normocephalic and atraumatic.  Right Ear: External ear normal.  Left Ear: External ear normal.  Nose: Nose  normal.  Mouth/Throat: Oropharynx is clear and moist.  Eyes: Conjunctivae and EOM are normal. Pupils are equal, round, and reactive to light.  Neck: Normal range of motion. No thyromegaly present.  Cardiovascular: Normal rate.   Respiratory: Effort normal.  GI:       Did not examine.  Genitourinary:       Did not examine.  Musculoskeletal: Normal range of motion.  Neurological: She is alert and oriented to person, place, and time. No cranial nerve deficit.  Skin: Skin is warm and dry.  Psychiatric: She has a normal mood and affect. Her behavior is normal. Judgment and thought content normal.     Assessment/Plan Right thyroid nodule. To OR for right thyroid lobectomy, possible total.  The right lobe will be examined by pathology intraoperatively and if cancer is diagnosed, the left lobe will also be removed.  Observe overnight after surgery.  Jennise Both 06/18/2011, 8:06 AM

## 2011-06-18 NOTE — Anesthesia Procedure Notes (Signed)
Procedure Name: Intubation Date/Time: 06/18/2011 8:31 AM Performed by: Gwenyth Allegra Pre-anesthesia Checklist: Patient identified, Timeout performed, Emergency Drugs available, Suction available and Patient being monitored Patient Re-evaluated:Patient Re-evaluated prior to inductionOxygen Delivery Method: Circle system utilized Preoxygenation: Pre-oxygenation with 100% oxygen Intubation Type: IV induction Ventilation: Mask ventilation without difficulty and Oral airway inserted - appropriate to patient size Laryngoscope Size: Mac and 3 Grade View: Grade II Tube type: Oral Tube size: 7.0 mm Number of attempts: 1 Airway Equipment and Method: Stylet Placement Confirmation: ETT inserted through vocal cords under direct vision,  positive ETCO2 and breath sounds checked- equal and bilateral Secured at: 22 cm Tube secured with: Tape

## 2011-06-19 MED ORDER — HYDROCODONE-ACETAMINOPHEN 5-325 MG PO TABS: 1.0000 | ORAL_TABLET | ORAL | Status: AC | PRN

## 2011-06-19 NOTE — Op Note (Signed)
NAMEGILLIE, Lisa [name] NO.:  [id number]  MEDICAL RECORD NO.:  [id number]  LOCATION:  5158                         FACILITY:  [facility]  PHYSICIAN:  Antony Contras, MD     DATE OF BIRTH:  08-24-1980  DATE OF PROCEDURE:  06/18/2011 DATE OF DISCHARGE:                              OPERATIVE REPORT   PREOPERATIVE DIAGNOSIS:  Right thyroid nodule.  POSTOPERATIVE DIAGNOSIS:  Right thyroid nodule.  PROCEDURE:  Right thyroid lobectomy.  SURGEON:  Excell Seltzer. Jenne Pane, M.D.  ASSISTANT:  Aquilla Hacker, PA-C.  ANESTHESIA:  General endotracheal anesthesia.  COMPLICATIONS:  None.  INDICATION:  The patient is a 31 year old white female who recently underwent an MRI of the head and neck because of neurologic symptoms that instantly revealed a 1.8-cm nodule in the right thyroid lobe.  This was followed by ultrasound and then ultrasound-guided FNA, which was inconclusive.  After discussing options of observation versus excisional biopsy, she wishes to proceed with excisional biopsy.  She presents to the operating room for surgical management.  FINDINGS:  The right thyroid lobe was a bit enlarged without a distinct nodule palpable.  The lobe was sent for frozen section Pathology and there was no carcinoma identified.  DESCRIPTION OF PROCEDURE:  The patient was identified in the holding room and informed consent having been obtained including discussion of risks, benefits, and alternatives, the patient was brought to the operative suite and put on the operative table in supine position. Anesthesia was induced and the patient was intubated by the Anesthesia team without difficulty.  The patient was given intravenous antibiotics during the case.  The eyes were taped closed and a shoulder roll was placed.  The neck incision was marked with a marking pen and injected with 1% lidocaine with 1:100,000 epinephrine.  The neck was prepped and draped in sterile fashion.  An incision was  made with a 15 blade scalpel and extended through the subcutaneous layer and platysma layer using Bovie electrocautery.  Subplatysmal flaps were elevated superiorly and inferiorly.  Self-retaining retractor was added.  The midline raphe of the strap muscles was then divided using Bovie electrocautery.  The right-sided strap muscles were then retracted laterally while dissection was performed along the capsule of the thyroid lobe on the right side. This was done around the lateral margin of the lobe as well as inferiorly dividing vessels and continuing the dissection.  Attention was then directed to the superior pedicle, which was dissected and then divided using angled clamps and then ligated.  Dissection continued then along the lateral border of the gland keeping an inferior parathyroid gland in place as well as what appeared to be a superior gland.  Further dissection revealed the recurrent laryngeal nerve, which was then traced and kept intact while the gland was carefully dissected from near the nerve.  The lobe was then divided in the midline and retracted laterally from that direction dividing Berry ligament.  Further dissection was performed off Berry ligament until the right lobe was removed.  This was passed to nursing for Pathology.  The wound was then copiously irrigated with saline and bleeding controlled with bipolar electrocautery.  The patient was given a Valsalva maneuver and no additional bleeding  was seen.  A 7-French suction drain was placed in the wound and extended in the incision using a 2-0 nylon suture and a standard drain stitch.  The midline raphe was closed with 3-0 Vicryl suture in a simple interrupted fashion.  The platysmal layer was closed with 4-0 Vicryl suture in a simple interrupted fashion.  The subcutaneous layer was closed with 4-0 Vicryl suture in a simple interrupted fashion.  The skin was closed with Dermabond after the result came back from  Pathology.  The patient was cleaned off and the drain hooked to bulb suction and attached the left shoulder using tape.  The patient was then returned to anesthesia for wake-up, was extubated, and moved to recovery room in stable condition.     Antony Contras, MD     DDB/MEDQ  D:  06/18/2011  T:  06/19/2011  Job:  [id number]

## 2011-06-19 NOTE — Progress Notes (Signed)
DC home with husband. Understands DC instructions.  No questions at this time.

## 2011-06-19 NOTE — Discharge Summary (Signed)
Physician Discharge Summary  Patient ID: Lisa [name] MRN: [id number] DOB/AGE: 13-Jan-1981 30 y.o.  Admit date: 06/18/2011 Discharge date: 06/19/2011  Admission Diagnoses: Right thyroid nodule  Discharge Diagnoses:  Principal Problem:  *Thyroid nodule   Discharged Condition: good  Hospital Course: 31 year old underwent right thyroid lobectomy for thyroid nodule.  For details, see operative note.  Was observed overnight with suction drain in place.  Did well except for some itching that was controlled with Benadryl.  No rash.  Voice is normal.  Swallowing well.  Consults: None  Significant Diagnostic Studies: None.  Treatments: analgesia: Vicodin  Discharge Exam: Blood pressure 126/89, pulse 80, temperature 98.5 F (36.9 C), temperature source Oral, resp. rate 18, height 5\' 9"  (1.753 m), weight 88.451 kg (195 lb), last menstrual period 06/11/2011, SpO2 99.00%. General appearance: alert, cooperative and no distress Neck: Thyroid incision clean and intact.  Drain removed.  No fluid collection.  Normal voice.  Disposition: 01-Home or Self Care  Discharge Orders    Future Orders Please Complete By Expires   Diet - low sodium heart healthy      Increase activity slowly        Medication List  As of 06/19/2011  9:03 AM   TAKE these medications         ALPRAZolam 0.25 MG tablet   Commonly known as: XANAX   Take 0.25 mg by mouth daily as needed. For anxiety; patient takes along with Ibuprofen and Zofran at onset of migraine        HYDROcodone-acetaminophen 5-325 MG per tablet   Commonly known as: NORCO   Take 1-2 tablets by mouth every 4 (four) hours as needed.      ibuprofen 800 MG tablet   Commonly known as: ADVIL,MOTRIN   Take 800 mg by mouth every 8 (eight) hours as needed. For pain; takes along with Xanax and Zofran at onset of migraine      ondansetron 8 MG tablet   Commonly known as: ZOFRAN   Take 8 mg by mouth every 8 (eight) hours as needed. For nausea;  takes along with Ibuprofen and Xanax at onset of migraine           Follow-up Information    Follow up with Satchel Heidinger, MD. Schedule an appointment as soon as possible for a visit in 1 week.   Contact information:   [facility] [address], [address] [place] [address] [phone]          Signed: Christia Reading 06/19/2011, 9:03 AM

## 2011-06-20 ENCOUNTER — Encounter (HOSPITAL_COMMUNITY): Payer: Self-pay | Admitting: Otolaryngology

## 2011-10-16 ENCOUNTER — Encounter (HOSPITAL_COMMUNITY): Payer: Self-pay | Admitting: *Deleted

## 2011-10-16 ENCOUNTER — Emergency Department (HOSPITAL_COMMUNITY)
Admission: EM | Admit: 2011-10-16 | Discharge: 2011-10-16 | Disposition: A | Discharge status: Home / Self Care | Payer: 59 | Attending: Emergency Medicine | Admitting: Emergency Medicine

## 2011-10-16 DIAGNOSIS — G43909 Migraine, unspecified, not intractable, without status migrainosus: Secondary | ICD-10-CM

## 2011-10-16 DIAGNOSIS — I10 Essential (primary) hypertension: Secondary | ICD-10-CM | POA: Insufficient documentation

## 2011-10-16 DIAGNOSIS — Z87891 Personal history of nicotine dependence: Secondary | ICD-10-CM | POA: Insufficient documentation

## 2011-10-16 MED ORDER — SODIUM CHLORIDE 0.9 % IV BOLUS (SEPSIS)
1000.0000 mL | Freq: Once | INTRAVENOUS | Status: AC
  Administered 2011-10-16: 1000 mL via INTRAVENOUS

## 2011-10-16 MED ORDER — METOCLOPRAMIDE HCL 5 MG/ML IJ SOLN
10.0000 mg | Freq: Once | INTRAMUSCULAR | Status: AC
  Administered 2011-10-16: 10 mg via INTRAVENOUS
  Filled 2011-10-16: qty 2

## 2011-10-16 MED ORDER — DIPHENHYDRAMINE HCL 50 MG/ML IJ SOLN
25.0000 mg | Freq: Once | INTRAMUSCULAR | Status: AC
  Administered 2011-10-16: 25 mg via INTRAVENOUS
  Filled 2011-10-16: qty 1

## 2011-10-16 MED ORDER — KETOROLAC TROMETHAMINE 30 MG/ML IJ SOLN
30.0000 mg | INTRAMUSCULAR | Status: AC
  Administered 2011-10-16: 30 mg via INTRAVENOUS
  Filled 2011-10-16: qty 1

## 2011-10-16 MED ORDER — METOCLOPRAMIDE HCL 10 MG PO TABS: 10.0000 mg | ORAL_TABLET | Freq: Four times a day (QID) | ORAL | Status: DC | PRN

## 2011-10-16 NOTE — ED Notes (Signed)
Pt c/o migraine headache since Saturday. Also c/o nausea and photophobia. History of migraines. Vomited x 3 last night.

## 2011-10-16 NOTE — ED Provider Notes (Signed)
History     CSN: [id number]  Arrival date & time 10/16/11  1203   First MD Initiated Contact with Patient 10/16/11 1254      Chief Complaint  Patient presents with  . Migraine    (Consider location/radiation/quality/duration/timing/severity/associated sxs/prior treatment) Patient is a 31 y.o. female presenting with migraine. The history is provided by the patient.  Migraine  She has a history of migraine headaches and developed a left hemicranial headache 3 days ago. Pain is dull and throbbing and severe. She rates the pain at 8/10. There is associated nausea, vomiting, photophobia. She sees floaters but is not having any blurring of her vision. Headache is worse with exposure to light. Nothing has made it any better. She usually takes a cocktail of ondansetron, ibuprofen, and alprazolam. She tried this cocktail but did not get relief.  Past Medical History  Diagnosis Date  . Hypertension   . Anxiety   . Headache   . Thyroid nodule   . Neuromuscular disorder     sus  . PONV (postoperative nausea and vomiting)     Past Surgical History  Procedure Date  . Cesarean section   . Tympanoplasty   . Cesarean section   . Tonsillectomy   . Thyroidectomy 06/18/2011    Procedure: THYROIDECTOMY;  Surgeon: Christia Reading, MD;  Location: [facility] OR;  Service: ENT;  Laterality: Right;    History reviewed. No pertinent family history.  History  Substance Use Topics  . Smoking status: Former Games developer  . Smokeless tobacco: Not on file  . Alcohol Use: Yes     1/month    OB History    Grav Para Term Preterm Abortions TAB SAB Ect Mult Living                  Review of Systems  All other systems reviewed and are negative.    Allergies  Darvocet; Cephalexin; Imitrex; and Penicillins  Home Medications   Current Outpatient Rx  Name Route Sig Dispense Refill  . ALPRAZOLAM 0.25 MG PO TABS Oral Take 0.25 mg by mouth daily as needed. For anxiety; patient takes along with Ibuprofen and  Zofran at onset of migraine     . IBUPROFEN 800 MG PO TABS Oral Take 800 mg by mouth every 8 (eight) hours as needed. For pain; takes along with Xanax and Zofran at onset of migraine    . ONDANSETRON HCL 8 MG PO TABS Oral Take 8 mg by mouth every 8 (eight) hours as needed. For nausea; takes along with Ibuprofen and Xanax at onset of migraine      BP 141/106  Pulse 76  Temp 98.5 F (36.9 C) (Oral)  Resp 18  Ht 5\' 9"  (1.753 m)  Wt 207 lb (93.895 kg)  BMI 30.57 kg/m2  SpO2 97%  LMP 10/02/2011  Physical Exam  Nursing note and vitals reviewed.  31 year old female who is resting comfortably and in no acute distress. Vital signs are significant for mild hypertension with blood pressure 141/106. Oxygen saturation is 97% which is normal. Head is normocephalic and atraumatic. PERRLA, EOMI. Fundi show no hemorrhage, exudate, or papilledema. Neck is nontender and supple. Back is nontender. Lungs are clear without rales, wheezes, or rhonchi. Heart has regular rate and rhythm without murmur. Abdomen is soft, flat, nontender without masses or hepatosplenomegaly. Extremities have full range of motion, no cyanosis or edema. Skin is warm and dry without rash. Neurologic: Status is normal, cranial nerves are intact, there are no motor or  sensory deficits.  ED Course  Procedures (including critical care time)   1. Migraine headache       MDM  Migraine headache. She will be given a fluid bolus and headache cocktail and reassessed.  After IV fluid bolus, IV metoclopramide, IV diphenhydramine, and IV ketorolac she felt much better. She is sent home with prescription for metoclopramide to see if he would be more effective in her home headache cocktail and ondansetron.   Dione Booze, MD 10/16/11 [phone]

## 2011-10-30 ENCOUNTER — Emergency Department (HOSPITAL_COMMUNITY)
Admission: EM | Admit: 2011-10-30 | Discharge: 2011-10-30 | Disposition: A | Discharge status: Home / Self Care | Payer: 59 | Attending: Emergency Medicine | Admitting: Emergency Medicine

## 2011-10-30 ENCOUNTER — Encounter (HOSPITAL_COMMUNITY): Payer: Self-pay | Admitting: *Deleted

## 2011-10-30 DIAGNOSIS — F411 Generalized anxiety disorder: Secondary | ICD-10-CM | POA: Insufficient documentation

## 2011-10-30 DIAGNOSIS — R300 Dysuria: Secondary | ICD-10-CM | POA: Insufficient documentation

## 2011-10-30 DIAGNOSIS — T50905A Adverse effect of unspecified drugs, medicaments and biological substances, initial encounter: Secondary | ICD-10-CM

## 2011-10-30 DIAGNOSIS — Z880 Allergy status to penicillin: Secondary | ICD-10-CM | POA: Insufficient documentation

## 2011-10-30 DIAGNOSIS — I10 Essential (primary) hypertension: Secondary | ICD-10-CM | POA: Insufficient documentation

## 2011-10-30 DIAGNOSIS — R5381 Other malaise: Secondary | ICD-10-CM | POA: Insufficient documentation

## 2011-10-30 DIAGNOSIS — IMO0001 Reserved for inherently not codable concepts without codable children: Secondary | ICD-10-CM

## 2011-10-30 DIAGNOSIS — T370X5A Adverse effect of sulfonamides, initial encounter: Secondary | ICD-10-CM | POA: Insufficient documentation

## 2011-10-30 DIAGNOSIS — Z87891 Personal history of nicotine dependence: Secondary | ICD-10-CM | POA: Insufficient documentation

## 2011-10-30 DIAGNOSIS — R5383 Other fatigue: Secondary | ICD-10-CM | POA: Insufficient documentation

## 2011-10-30 DIAGNOSIS — E079 Disorder of thyroid, unspecified: Secondary | ICD-10-CM | POA: Insufficient documentation

## 2011-10-30 DIAGNOSIS — H5789 Other specified disorders of eye and adnexa: Secondary | ICD-10-CM | POA: Insufficient documentation

## 2011-10-30 LAB — URINALYSIS, ROUTINE W REFLEX MICROSCOPIC
Ketones, ur: NEGATIVE mg/dL
Nitrite: NEGATIVE
Urobilinogen, UA: 0.2 mg/dL (ref 0.0–1.0)
pH: 7 (ref 5.0–8.0)

## 2011-10-30 LAB — CBC
MCH: 29.3 pg (ref 26.0–34.0)
MCHC: 34.4 g/dL (ref 30.0–36.0)
Platelets: 195 10*3/uL (ref 150–400)
RBC: 4.64 MIL/uL (ref 3.87–5.11)
RDW: 13.3 % (ref 11.5–15.5)

## 2011-10-30 LAB — BASIC METABOLIC PANEL
CO2: 23 mEq/L (ref 19–32)
Calcium: 9 mg/dL (ref 8.4–10.5)
Creatinine, Ser: 0.64 mg/dL (ref 0.50–1.10)
GFR calc Af Amer: >90 mL/min (ref >90)
GFR calc non Af Amer: >90 mL/min (ref >90)
Sodium: 139 mEq/L (ref 135–145)

## 2011-10-30 LAB — URINE MICROSCOPIC-ADD ON

## 2011-10-30 MED ORDER — DIPHENHYDRAMINE HCL 25 MG PO CAPS
25.0000 mg | ORAL_CAPSULE | Freq: Once | ORAL | Status: AC
  Administered 2011-10-30: 25 mg via ORAL
  Filled 2011-10-30: qty 1

## 2011-10-30 NOTE — ED Provider Notes (Addendum)
History     CSN: [id number]  Arrival date & time 10/30/11  1655   First MD Initiated Contact with Patient 10/30/11 1957      No chief complaint on file.   (Consider location/radiation/quality/duration/timing/severity/associated sxs/prior treatment) The history is provided by the patient.  pt states recent tx for uti starting 2 weeks ago. States had dysuria, had ua done, was give macrobid. Pt just returned to her doctor stating dysuria not resolved. Was empirically given rx bactrim. Took first dose today, approx 2 hours later noted 'felt funny', eyes red, general malaise. Denies rash/hives. No wheezing or sob. No chest tightness. No faintness. Denies throat pain or oral/mm irritation or rash. Denies any other new med use. States did have thyroid med increased 2 weeks ago after having thyroid tests checked. Had previous thyroid lobectomy 3/13. States after med adjustment is due to have thyroid tests rechecked 9/13. Denies any recent flushing, palpitations, heat intolerance, sweats, dyspnea.  Denies fever or chills. No abd pain. No nvd.   Past Medical History  Diagnosis Date  . Hypertension   . Anxiety   . Headache   . Thyroid nodule   . Neuromuscular disorder     sus  . PONV (postoperative nausea and vomiting)     Past Surgical History  Procedure Date  . Cesarean section   . Tympanoplasty   . Cesarean section   . Tonsillectomy   . Thyroidectomy 06/18/2011    Procedure: THYROIDECTOMY;  Surgeon: Christia Reading, MD;  Location: [facility] OR;  Service: ENT;  Laterality: Right;    No family history on file.  History  Substance Use Topics  . Smoking status: Former Games developer  . Smokeless tobacco: Not on file  . Alcohol Use: Yes     1/month    OB History    Grav Para Term Preterm Abortions TAB SAB Ect Mult Living                  Review of Systems  Constitutional: Negative for fever.  HENT: Negative for neck pain.   Eyes: Positive for redness. Negative for discharge.  Respiratory:  Negative for shortness of breath.   Cardiovascular: Negative for chest pain, palpitations and leg swelling.  Gastrointestinal: Negative for vomiting, abdominal pain and diarrhea.  Genitourinary: Positive for dysuria. Negative for flank pain.  Musculoskeletal: Negative for back pain.  Skin: Negative for rash.  Neurological: Negative for numbness and headaches.  Hematological: Does not bruise/bleed easily.  Psychiatric/Behavioral: Negative for confusion.    Allergies  Darvocet; Cephalexin; Imitrex; and Penicillins  Home Medications   Current Outpatient Rx  Name Route Sig Dispense Refill  . BIOTIN 10 MG PO CAPS Oral Take 10 mg by mouth at bedtime.     . IBUPROFEN 800 MG PO TABS Oral Take 800 mg by mouth every 8 (eight) hours as needed. For pain; takes along with Xanax and Zofran at onset of migraine    . LEVOTHYROXINE SODIUM 25 MCG PO TABS Oral Take 25 mcg by mouth daily.    . ADULT MULTIVITAMIN W/MINERALS CH Oral Take 1 tablet by mouth at bedtime.     Marland Kitchen ONDANSETRON HCL 8 MG PO TABS Oral Take 8 mg by mouth every 8 (eight) hours as needed. For nausea; takes along with Ibuprofen and Xanax at onset of migraine    . SULFAMETHOXAZOLE-TMP DS 800-160 MG PO TABS Oral Take 1 tablet by mouth 2 (two) times daily. Started 7/16 For 5 days    . NITROFURANTOIN MONOHYD MACRO  100 MG PO CAPS Oral Take 100 mg by mouth 2 (two) times daily. Started 7/6 For 10 days      BP 137/106  Pulse 91  Temp 98.2 F (36.8 C) (Oral)  Resp 15  SpO2 100%  LMP 10/02/2011  Physical Exam  Nursing note and vitals reviewed. Constitutional: She is oriented to person, place, and time. She appears well-developed and well-nourished. No distress.  HENT:  Nose: Nose normal.  Mouth/Throat: Oropharynx is clear and moist.       No oral lesions  Eyes: No scleral icterus.       bil conj mildly injected. No exudate.   Neck: Neck supple. No tracheal deviation present. No thyromegaly present.       Thyroid not enlarged or  tender  Cardiovascular: Normal rate, normal heart sounds and intact distal pulses.   Pulmonary/Chest: Effort normal and breath sounds normal. No respiratory distress. She has no wheezes.  Abdominal: Soft. Normal appearance and bowel sounds are normal. She exhibits no distension. There is no tenderness.  Genitourinary:       No cva tenderness  Musculoskeletal: She exhibits no edema and no tenderness.  Neurological: She is alert and oriented to person, place, and time.  Skin: Skin is warm and dry. No rash noted.    ED Course  Procedures (including critical care time)   Labs Reviewed  URINALYSIS, ROUTINE W REFLEX MICROSCOPIC    Results for orders placed during the hospital encounter of 10/30/11  URINALYSIS, ROUTINE W REFLEX MICROSCOPIC      Component Value Range   Color, Urine YELLOW  YELLOW   APPearance CLEAR  CLEAR   Specific Gravity, Urine 1.009  1.005 - 1.030   pH 7.0  5.0 - 8.0   Glucose, UA NEGATIVE  NEGATIVE mg/dL   Hgb urine dipstick TRACE (*) NEGATIVE   Bilirubin Urine NEGATIVE  NEGATIVE   Ketones, ur NEGATIVE  NEGATIVE mg/dL   Protein, ur NEGATIVE  NEGATIVE mg/dL   Urobilinogen, UA 0.2  0.0 - 1.0 mg/dL   Nitrite NEGATIVE  NEGATIVE   Leukocytes, UA MODERATE (*) NEGATIVE  PREGNANCY, URINE      Component Value Range   Preg Test, Ur NEGATIVE  NEGATIVE  CBC      Component Value Range   WBC 9.8  4.0 - 10.5 K/uL   RBC 4.64  3.87 - 5.11 MIL/uL   Hemoglobin 13.6  12.0 - 15.0 g/dL   HCT 16.1  09.6 - 04.5 %   MCV 85.1  78.0 - 100.0 fL   MCH 29.3  26.0 - 34.0 pg   MCHC 34.4  30.0 - 36.0 g/dL   RDW 40.9  81.1 - 91.4 %   Platelets 195  150 - 400 K/uL  BASIC METABOLIC PANEL      Component Value Range   Sodium 139  135 - 145 mEq/L   Potassium 3.6  3.5 - 5.1 mEq/L   Chloride 103  96 - 112 mEq/L   CO2 23  19 - 32 mEq/L   Glucose, Bld 85  70 - 99 mg/dL   BUN 9  6 - 23 mg/dL   Creatinine, Ser 7.82  0.50 - 1.10 mg/dL   Calcium 9.0  8.4 - 95.6 mg/dL   GFR calc non Af Amer  >90  >90 mL/min   GFR calc Af Amer >90  >90 mL/min  URINE MICROSCOPIC-ADD ON      Component Value Range   Squamous Epithelial / LPF FEW (*) RARE  WBC, UA 3-6  <3 WBC/hpf   RBC / HPF 0-2  <3 RBC/hpf       MDM  Labs. Pt notes 'feeling strange' since taking bactrim. Denies prior allergy to sulfa/bactrim. No wheezing or sob. No chest tightness. No rash/hives.  Will try benadryl po.   Pt asymptomatic on recheck in ed. Hr 86 rr  14-16 per checking on pt  (not 30 as noted under ed vitals) pulse ox 100%.   Will have hold bactrim. On ua, few epis, few wbc, nitrite neg - will culture. Discussed that urine culture sent. To f/u pcp 1-2 days for recheck and follow up of urine culture results.         Suzi Roots, MD 10/30/11 1914  Suzi Roots, MD 10/30/11 2212

## 2011-10-30 NOTE — ED Notes (Signed)
Macrobid for uti and no results.  Pt was started on Bactrim and then started not feeling right.  Pt states not feeling well and blood pressure elevated.  Pt describes it as seeing star.  No throat swelling or problems breathing.  PT had thyroid medications doubled 2 weeks ago.  MD referred pt here.  PT states feeling bad since 1400.  No headache or nausea.  Pt is mentating well and following commands

## 2011-10-30 NOTE — ED Notes (Signed)
Pt. Reports starting bactrim today for a UTI. After use pt states "I'm just not feeling right". Reports N/V and increase in BP. Pt also increased thyroid medication X2 weeks ago. MD referred pt here. Pt. Alert and oriented X4. Airway patent.

## 2011-11-01 LAB — URINE CULTURE: Culture: NO GROWTH

## 2012-03-14 ENCOUNTER — Encounter (HOSPITAL_COMMUNITY): Payer: Self-pay | Admitting: Emergency Medicine

## 2012-03-14 ENCOUNTER — Emergency Department (HOSPITAL_COMMUNITY)
Admission: EM | Admit: 2012-03-14 | Discharge: 2012-03-14 | Disposition: A | Discharge status: Home / Self Care | Payer: 59 | Source: Home / Self Care | Attending: Emergency Medicine | Admitting: Emergency Medicine

## 2012-03-14 DIAGNOSIS — H6091 Unspecified otitis externa, right ear: Secondary | ICD-10-CM

## 2012-03-14 DIAGNOSIS — H9209 Otalgia, unspecified ear: Secondary | ICD-10-CM

## 2012-03-14 DIAGNOSIS — H60399 Other infective otitis externa, unspecified ear: Secondary | ICD-10-CM

## 2012-03-14 MED ORDER — ACETIC ACID 2 % OT SOLN: 4.0000 [drp] | Freq: Three times a day (TID) | OTIC | Status: DC

## 2012-03-14 MED ORDER — CIPROFLOXACIN-DEXAMETHASONE 0.3-0.1 % OT SUSP: 4.0000 [drp] | Freq: Two times a day (BID) | OTIC | Status: DC

## 2012-03-14 NOTE — ED Notes (Signed)
Pt c/o right ear pain x 2 weeks... Has seen her PCP and has been given z-pack which she has finished and now is on biaxin which she took today... 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 Sx include: pain, drainage of pus and blood, fevers, chills, vomiting, nauseas, diarrhea... Pt is alert w/no signs of acute distress.

## 2012-03-14 NOTE — ED Provider Notes (Signed)
History     CSN: [id number]  Arrival date & time 03/14/12  1312   First MD Initiated Contact with Patient 03/14/12 1605      Chief Complaint  Patient presents with  . Otalgia    (Consider location/radiation/quality/duration/timing/severity/associated sxs/prior treatment) Patient is a 31 y.o. female presenting with ear pain. The history is provided by the patient.  Otalgia This is a new problem. The current episode started more than 1 week ago. There is pain in the right ear. The problem occurs daily. The problem has not changed since onset.The maximum temperature recorded prior to her arrival was 101 to 101.9 F. The fever has been present for 3 to 4 days. The pain is at a severity of 5/10. Associated symptoms include ear discharge and headaches. Pertinent negatives include no hearing loss, no rhinorrhea, no sore throat, no abdominal pain, no diarrhea, no vomiting, no neck pain, no cough and no rash. Her past medical history is significant for chronic ear infection.  Pt reports she is currently on her second course of antibiotic oral medication for right ear infection.  States pain is consistent with ear drainage and noted fever.  Spoke with her ENT today and was instructed to present to UC for evaluation since his office was closed.  Past Medical History  Diagnosis Date  . Hypertension   . Anxiety   . Headache   . Thyroid nodule   . Neuromuscular disorder     sus  . PONV (postoperative nausea and vomiting)     Past Surgical History  Procedure Date  . Cesarean section   . Tympanoplasty   . Cesarean section   . Tonsillectomy   . Thyroidectomy 06/18/2011    Procedure: THYROIDECTOMY;  Surgeon: Christia Reading, MD;  Location: [facility] OR;  Service: ENT;  Laterality: Right;    No family history on file.  History  Substance Use Topics  . Smoking status: Former Games developer  . Smokeless tobacco: Not on file  . Alcohol Use: Yes     Comment: 1/month    OB History    Grav Para Term Preterm  Abortions TAB SAB Ect Mult Living                  Review of Systems  HENT: Positive for ear pain and ear discharge. Negative for hearing loss, sore throat, rhinorrhea and neck pain.   Respiratory: Negative for cough.   Gastrointestinal: Negative for vomiting, abdominal pain and diarrhea.  Skin: Negative for rash.  Neurological: Positive for headaches.  All other systems reviewed and are negative.    Allergies  Darvocet; Cephalexin; Imitrex; and Penicillins  Home Medications   Current Outpatient Rx  Name  Route  Sig  Dispense  Refill  . IBUPROFEN 800 MG PO TABS   Oral   Take 800 mg by mouth every 8 (eight) hours as needed. For pain; takes along with Xanax and Zofran at onset of migraine         . LEVOTHYROXINE SODIUM 25 MCG PO TABS   Oral   Take 25 mcg by mouth daily.         . ACETIC ACID 2 % OT SOLN   Right Ear   Place 4 drops into the right ear 3 (three) times daily.   15 mL   0   . BIOTIN 10 MG PO CAPS   Oral   Take 10 mg by mouth at bedtime.          Marland Kitchen CIPROFLOXACIN-DEXAMETHASONE  0.3-0.1 % OT SUSP   Right Ear   Place 4 drops into the right ear 2 (two) times daily.   7.5 mL   0   . ADULT MULTIVITAMIN W/MINERALS CH   Oral   Take 1 tablet by mouth at bedtime.          Marland Kitchen NITROFURANTOIN MONOHYD MACRO 100 MG PO CAPS   Oral   Take 100 mg by mouth 2 (two) times daily. Started 7/6 For 10 days         . ONDANSETRON HCL 8 MG PO TABS   Oral   Take 8 mg by mouth every 8 (eight) hours as needed. For nausea; takes along with Ibuprofen and Xanax at onset of migraine         . SULFAMETHOXAZOLE-TMP DS 800-160 MG PO TABS   Oral   Take 1 tablet by mouth 2 (two) times daily. Started 7/16 For 5 days           BP 130/92  Pulse 71  Temp 98.6 F (37 C) (Oral)  Resp 16  SpO2 98%  LMP 03/07/2012  Physical Exam  Nursing note and vitals reviewed. Constitutional: She is oriented to person, place, and time. Vital signs are normal. She appears  well-developed and well-nourished. She is active and cooperative.  HENT:  Head: Normocephalic.  Right Ear: There is drainage and swelling. Decreased hearing is noted.  Left Ear: Hearing, external ear and ear canal normal. Tympanic membrane is scarred.  Nose: Nose normal. Right sinus exhibits no maxillary sinus tenderness and no frontal sinus tenderness. Left sinus exhibits no maxillary sinus tenderness and no frontal sinus tenderness.  Mouth/Throat: Uvula is midline, oropharynx is clear and moist and mucous membranes are normal.  Eyes: Conjunctivae normal are normal. Pupils are equal, round, and reactive to light. No scleral icterus.  Neck: Trachea normal. Neck supple.  Cardiovascular: Normal rate and regular rhythm.   Pulmonary/Chest: Effort normal and breath sounds normal.  Neurological: She is alert and oriented to person, place, and time. No cranial nerve deficit or sensory deficit.  Skin: Skin is warm and dry.  Psychiatric: She has a normal mood and affect. Her speech is normal and behavior is normal. Judgment and thought content normal. Cognition and memory are normal.    ED Course  Procedures (including critical care time)  Labs Reviewed - No data to display No results found.   1. Right otitis externa   2. Otalgia       MDM  Continue biaxin as prescribed, add ciprodex and acetic acid.  Follow up with ENT in 2 days if symptoms are not improved.        Johnsie Kindred, NP 03/14/12 1648

## 2012-03-14 NOTE — ED Provider Notes (Signed)
Medical screening examination/treatment/procedure(s) were performed by non-physician practitioner and as supervising physician I was immediately available for consultation/collaboration.  Leslee Home, M.D.   Reuben Likes, MD 03/14/12 [phone]

## 2012-04-15 ENCOUNTER — Emergency Department (HOSPITAL_COMMUNITY)
Admission: EM | Admit: 2012-04-15 | Discharge: 2012-04-15 | Disposition: A | Discharge status: Home / Self Care | Payer: 59 | Source: Home / Self Care

## 2012-04-15 ENCOUNTER — Encounter (HOSPITAL_COMMUNITY): Payer: Self-pay | Admitting: Emergency Medicine

## 2012-04-15 DIAGNOSIS — S161XXA Strain of muscle, fascia and tendon at neck level, initial encounter: Secondary | ICD-10-CM

## 2012-04-15 DIAGNOSIS — H60399 Other infective otitis externa, unspecified ear: Secondary | ICD-10-CM

## 2012-04-15 DIAGNOSIS — H9209 Otalgia, unspecified ear: Secondary | ICD-10-CM

## 2012-04-15 MED ORDER — IBUPROFEN 800 MG PO TABS: 800.0000 mg | ORAL_TABLET | Freq: Three times a day (TID) | ORAL | Status: DC

## 2012-04-15 MED ORDER — CYCLOBENZAPRINE HCL 5 MG PO TABS: 5.0000 mg | ORAL_TABLET | Freq: Two times a day (BID) | ORAL | Status: DC | PRN

## 2012-04-15 NOTE — ED Provider Notes (Signed)
History     CSN: [id number]  Arrival date & time 04/15/12  1650   None     Chief Complaint  Patient presents with  . Torticollis    stiff neck.headache. nausea unable to bend neck. and fever symptoms started monday morning.   Marland Kitchen Headache  . Otitis Media    (Consider location/radiation/quality/duration/timing/severity/associated sxs/prior treatment) The history is provided by the patient.   Lisa [name] is a 31 y.o. female who complains of neck pain that began today.  States pain is positional with movement of neck no radiation down the arms. No known injury.  Symptoms have been unchanged since that time. 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  There is no numbness, tingling, weakness in the arms.  Expresses further concern related to frequent ear infections, wondering if neck pain could be related to meningitis.  Past Medical History  Diagnosis Date  . Hypertension   . Anxiety   . Headache   . Thyroid nodule   . Neuromuscular disorder     sus  . PONV (postoperative nausea and vomiting)     Past Surgical History  Procedure Date  . Cesarean section   . Tympanoplasty   . Cesarean section   . Tonsillectomy   . Thyroidectomy 06/18/2011    Procedure: THYROIDECTOMY;  Surgeon: Christia Reading, MD;  Location: [facility] OR;  Service: ENT;  Laterality: Right;    History reviewed. No pertinent family history.  History  Substance Use Topics  . Smoking status: Former Games developer  . Smokeless tobacco: Not on file  . Alcohol Use: Yes     Comment: 1/month    OB History    Grav Para Term Preterm Abortions TAB SAB Ect Mult Living                  Review of Systems  All other systems reviewed and are negative.    Allergies  Darvocet; Cephalexin; Imitrex; and Penicillins  Home Medications   Current Outpatient Rx  Name  Route  Sig  Dispense  Refill  . LEVOTHYROXINE SODIUM 25 MCG PO TABS   Oral   Take 25 mcg by mouth daily.         . ACETIC  ACID 2 % OT SOLN   Right Ear   Place 4 drops into the right ear 3 (three) times daily.   15 mL   0   . BIOTIN 10 MG PO CAPS   Oral   Take 10 mg by mouth at bedtime.          Marland Kitchen CIPROFLOXACIN-DEXAMETHASONE 0.3-0.1 % OT SUSP   Right Ear   Place 4 drops into the right ear 2 (two) times daily.   7.5 mL   0   . CYCLOBENZAPRINE HCL 5 MG PO TABS   Oral   Take 1 tablet (5 mg total) by mouth 2 (two) times daily as needed for muscle spasms.   30 tablet   0   . IBUPROFEN 800 MG PO TABS   Oral   Take 800 mg by mouth every 8 (eight) hours as needed. For pain; takes along with Xanax and Zofran at onset of migraine         . IBUPROFEN 800 MG PO TABS   Oral   Take 1 tablet (800 mg total) by mouth 3 (three) times daily.   30 tablet   2   . ADULT MULTIVITAMIN W/MINERALS CH   Oral   Take  1 tablet by mouth at bedtime.          Marland Kitchen NITROFURANTOIN MONOHYD MACRO 100 MG PO CAPS   Oral   Take 100 mg by mouth 2 (two) times daily. Started 7/6 For 10 days         . ONDANSETRON HCL 8 MG PO TABS   Oral   Take 8 mg by mouth every 8 (eight) hours as needed. For nausea; takes along with Ibuprofen and Xanax at onset of migraine         . SULFAMETHOXAZOLE-TMP DS 800-160 MG PO TABS   Oral   Take 1 tablet by mouth 2 (two) times daily. Started 7/16 For 5 days           BP 177/105  Pulse 92  Temp 99.2 F (37.3 C) (Oral)  Resp 18  SpO2 100%  LMP 04/08/2012  Physical Exam  Nursing note and vitals reviewed. Constitutional: She is oriented to person, place, and time. Vital signs are normal. She appears well-developed and well-nourished. She is active and cooperative.  HENT:  Head: Normocephalic.  Right Ear: There is drainage. Tympanic membrane is erythematous.  Left Ear: There is drainage. Tympanic membrane is erythematous.  Nose: Nose normal.  Mouth/Throat: Uvula is midline, oropharynx is clear and moist and mucous membranes are normal.  Eyes: Conjunctivae normal are normal.  Pupils are equal, round, and reactive to light. No scleral icterus.  Neck: Trachea normal. Neck supple. Muscular tenderness present.       Decreased flexion related to stiffness  Cardiovascular: Normal rate, regular rhythm, normal heart sounds, intact distal pulses and normal pulses.   Pulmonary/Chest: Effort normal and breath sounds normal.  Lymphadenopathy:       Head (right side): No submental, no submandibular, no tonsillar, no preauricular, no posterior auricular and no occipital adenopathy present.       Head (left side): No submental, no submandibular, no tonsillar, no preauricular, no posterior auricular and no occipital adenopathy present.    She has no cervical adenopathy.  Neurological: She is alert and oriented to person, place, and time. No cranial nerve deficit or sensory deficit.  Skin: Skin is warm and dry.  Psychiatric: She has a normal mood and affect. Her speech is normal and behavior is normal. Judgment and thought content normal. Cognition and memory are normal.    ED Course  Procedures (including critical care time)  Labs Reviewed - No data to display No results found.   1. Neck muscle strain       MDM  NSAIDS and muscle relaxants, follow up with pcp for further evaluation of neck pain.  Continue antibiotic ear drops as previously prescribed by your ENT provider. Your symptoms are not suspicious for meningitis, f/u prn.     Johnsie Kindred, NP 04/15/12 1845

## 2012-04-15 NOTE — ED Notes (Signed)
Pt states that she has been dealing with a chronic ear infection for the past 5 wks and has been on three courses of antibiotics.  Since Monday pt states that she has had a headache that starts at the back of neck and radiates upward. Stiff neck unable to bend neck. And nausea.

## 2012-04-22 NOTE — ED Provider Notes (Signed)
Medical screening examination/treatment/procedure(s) were performed by resident physician or non-physician practitioner and as supervising physician I was immediately available for consultation/collaboration.   Barkley Bruns MD.    Linna Hoff, MD 04/22/12 1350

## 2012-08-20 ENCOUNTER — Emergency Department (HOSPITAL_COMMUNITY)
Admission: EM | Admit: 2012-08-20 | Discharge: 2012-08-20 | Disposition: A | Discharge status: Home / Self Care | Payer: 59 | Attending: Emergency Medicine | Admitting: Emergency Medicine

## 2012-08-20 ENCOUNTER — Encounter (HOSPITAL_COMMUNITY): Payer: Self-pay | Admitting: Emergency Medicine

## 2012-08-20 ENCOUNTER — Emergency Department (HOSPITAL_COMMUNITY): Payer: 59

## 2012-08-20 DIAGNOSIS — Z87891 Personal history of nicotine dependence: Secondary | ICD-10-CM | POA: Insufficient documentation

## 2012-08-20 DIAGNOSIS — B349 Viral infection, unspecified: Secondary | ICD-10-CM

## 2012-08-20 DIAGNOSIS — R110 Nausea: Secondary | ICD-10-CM | POA: Insufficient documentation

## 2012-08-20 DIAGNOSIS — E041 Nontoxic single thyroid nodule: Secondary | ICD-10-CM | POA: Insufficient documentation

## 2012-08-20 DIAGNOSIS — I10 Essential (primary) hypertension: Secondary | ICD-10-CM | POA: Insufficient documentation

## 2012-08-20 DIAGNOSIS — Z8659 Personal history of other mental and behavioral disorders: Secondary | ICD-10-CM | POA: Insufficient documentation

## 2012-08-20 DIAGNOSIS — B9789 Other viral agents as the cause of diseases classified elsewhere: Secondary | ICD-10-CM | POA: Insufficient documentation

## 2012-08-20 DIAGNOSIS — R630 Anorexia: Secondary | ICD-10-CM | POA: Insufficient documentation

## 2012-08-20 DIAGNOSIS — R6883 Chills (without fever): Secondary | ICD-10-CM | POA: Insufficient documentation

## 2012-08-20 DIAGNOSIS — R109 Unspecified abdominal pain: Secondary | ICD-10-CM

## 2012-08-20 DIAGNOSIS — Z79899 Other long term (current) drug therapy: Secondary | ICD-10-CM | POA: Insufficient documentation

## 2012-08-20 DIAGNOSIS — Z880 Allergy status to penicillin: Secondary | ICD-10-CM | POA: Insufficient documentation

## 2012-08-20 DIAGNOSIS — R197 Diarrhea, unspecified: Secondary | ICD-10-CM | POA: Insufficient documentation

## 2012-08-20 DIAGNOSIS — Z8669 Personal history of other diseases of the nervous system and sense organs: Secondary | ICD-10-CM | POA: Insufficient documentation

## 2012-08-20 LAB — URINALYSIS, ROUTINE W REFLEX MICROSCOPIC
Nitrite: NEGATIVE
Protein, ur: NEGATIVE mg/dL
Specific Gravity, Urine: 1.008 (ref 1.005–1.030)
Urobilinogen, UA: 0.2 mg/dL (ref 0.0–1.0)

## 2012-08-20 LAB — PREGNANCY, URINE: Preg Test, Ur: NEGATIVE

## 2012-08-20 LAB — CBC
MCV: 84 fL (ref 78.0–100.0)
Platelets: 186 10*3/uL (ref 150–400)
RBC: 4.3 MIL/uL (ref 3.87–5.11)
WBC: 4.9 10*3/uL (ref 4.0–10.5)

## 2012-08-20 LAB — BASIC METABOLIC PANEL
CO2: 26 mEq/L (ref 19–32)
Calcium: 8.8 mg/dL (ref 8.4–10.5)
GFR calc non Af Amer: >90 mL/min (ref >90)
Sodium: 139 mEq/L (ref 135–145)

## 2012-08-20 MED ORDER — DICYCLOMINE HCL 20 MG PO TABS: 20.0000 mg | ORAL_TABLET | Freq: Two times a day (BID) | ORAL | Status: DC

## 2012-08-20 MED ORDER — IOHEXOL 300 MG/ML  SOLN
25.0000 mL | INTRAMUSCULAR | Status: AC
  Administered 2012-08-20 (×2): 25 mL via ORAL

## 2012-08-20 MED ORDER — ONDANSETRON HCL 4 MG/2ML IJ SOLN
4.0000 mg | Freq: Once | INTRAMUSCULAR | Status: AC
  Administered 2012-08-20: 4 mg via INTRAVENOUS
  Filled 2012-08-20: qty 2

## 2012-08-20 MED ORDER — ONDANSETRON 4 MG PO TBDP: 4.0000 mg | ORAL_TABLET | Freq: Three times a day (TID) | ORAL | Status: DC | PRN

## 2012-08-20 MED ORDER — SODIUM CHLORIDE 0.9 % IV BOLUS (SEPSIS)
1000.0000 mL | Freq: Once | INTRAVENOUS | Status: AC
  Administered 2012-08-20: 1000 mL via INTRAVENOUS

## 2012-08-20 MED ORDER — IOHEXOL 300 MG/ML  SOLN
100.0000 mL | Freq: Once | INTRAMUSCULAR | Status: AC | PRN
  Administered 2012-08-20: 100 mL via INTRAVENOUS

## 2012-08-20 NOTE — ED Notes (Signed)
Kaitlyn S, PA at bedside 

## 2012-08-20 NOTE — ED Notes (Signed)
Pt alert and mentating appropriately upon d/c. Pt given d/c teaching and prescriptions. Pt verbalizes understanding of d/c teaching and prescriptions and has no further questions upon d/c. NAD noted upon d/c. Pt leaving with husband. Pt leaving with d/c teaching and prescriptions.

## 2012-08-20 NOTE — ED Provider Notes (Signed)
History     CSN: [id number]  Arrival date & time 08/20/12  6962   First MD Initiated Contact with Patient 08/20/12 [phone]      Chief Complaint  Patient presents with  . Abdominal Pain    (Consider location/radiation/quality/duration/timing/severity/associated sxs/prior treatment) HPI Comments: Patient is a 32 year old female with a past medical history of hypertension who presents with a 1 day history of abdominal pain. The pain started periumbilically and has now moved to her RLQ without radiation. The pain is described as sharp and severe. The pain started gradually and progressively worsened since the onset. No alleviating/aggravating factors. The patient has tried nothing for symptoms without relief. Associated symptoms include nausea, chills, and some loose stools. Patient denies fever, headache, vomiting, chest pain, SOB, dysuria, constipation, abnormal vaginal bleeding/discharge.     Patient is a 32 y.o. female presenting with abdominal pain.  Abdominal Pain Associated symptoms: chills, diarrhea and nausea     Past Medical History  Diagnosis Date  . Hypertension   . Anxiety   . Headache   . Thyroid nodule   . Neuromuscular disorder     sus  . PONV (postoperative nausea and vomiting)     Past Surgical History  Procedure Laterality Date  . Cesarean section    . Tympanoplasty    . Cesarean section    . Tonsillectomy    . Thyroidectomy  06/18/2011    Procedure: THYROIDECTOMY;  Surgeon: Christia Reading, MD;  Location: [facility] OR;  Service: ENT;  Laterality: Right;    No family history on file.  History  Substance Use Topics  . Smoking status: Former Games developer  . Smokeless tobacco: Not on file  . Alcohol Use: Yes     Comment: 1/month    OB History   Grav Para Term Preterm Abortions TAB SAB Ect Mult Living                  Review of Systems  Constitutional: Positive for chills and appetite change.  Gastrointestinal: Positive for nausea, abdominal pain and diarrhea.   All other systems reviewed and are negative.    Allergies  Darvocet; Sulfa antibiotics; Cephalexin; Imitrex; and Penicillins  Home Medications   Current Outpatient Rx  Name  Route  Sig  Dispense  Refill  . levothyroxine (SYNTHROID, LEVOTHROID) 88 MCG tablet   Oral   Take 88 mcg by mouth daily before breakfast.         . propranolol ER (INDERAL LA) 80 MG 24 hr capsule   Oral   Take 80 mg by mouth daily.           BP 120/79  Pulse 78  Temp(Src) 98.2 F (36.8 C) (Oral)  Resp 16  SpO2 99%  Physical Exam  Nursing note and vitals reviewed. Constitutional: She is oriented to person, place, and time. She appears well-developed and well-nourished. No distress.  HENT:  Head: Normocephalic and atraumatic.  Eyes: Conjunctivae and EOM are normal. Pupils are equal, round, and reactive to light. No scleral icterus.  Neck: Normal range of motion.  Cardiovascular: Normal rate and regular rhythm.  Exam reveals no gallop and no friction rub.   No murmur heard. Pulmonary/Chest: Effort normal and breath sounds normal. She has no wheezes. She has no rales. She exhibits no tenderness.  Abdominal: Soft. She exhibits no distension. There is tenderness. There is no rebound and no guarding.  RLQ tenderness at McBurney's point.   Musculoskeletal: Normal range of motion.  Neurological: She  is alert and oriented to person, place, and time. Coordination normal.  Speech is goal-oriented. Moves limbs without ataxia.   Skin: Skin is warm and dry.  Psychiatric: She has a normal mood and affect. Her behavior is normal.    ED Course  Procedures (including critical care time)  Labs Reviewed  URINALYSIS, ROUTINE W REFLEX MICROSCOPIC - Abnormal; Notable for the following:    Hgb urine dipstick SMALL (*)    All other components within normal limits  URINE CULTURE  PREGNANCY, URINE  BASIC METABOLIC PANEL  CBC  URINE MICROSCOPIC-ADD ON   Ct Abdomen Pelvis W Contrast  08/20/2012  *RADIOLOGY  REPORT*  Clinical Data: Right lower quadrant abdominal pain, nausea and diarrhea.  CT ABDOMEN AND PELVIS WITH CONTRAST  Technique:  Multidetector CT imaging of the abdomen and pelvis was performed following the standard protocol during bolus administration of intravenous contrast.  Contrast: OMNIPAQUE IOHEXOL 300 MG/ML  SOLN  Comparison: None.  Findings: The appendix is well visualized extending into the pelvis and shows no evidence of enlargement, thickening or inflammation.  Other bowel loops in the abdomen and pelvis are unremarkable and show no evidence of inflammatory process, obstruction or ileus.  The liver, gallbladder, pancreas, spleen, adrenal glands and kidneys are within normal limits.  No hernias or masses are identified.  Scattered small mesenteric lymph nodes are likely reactive and normal.  Bony structures show narrowing of the L5-S1 disc space level.  IMPRESSION: No evidence of acute appendicitis or other acute findings in the abdomen or pelvis.   Original Report Authenticated By: Irish Lack, M.D.      1. Viral illness   2. Abdominal pain       MDM  9:04 AM Labs pending. Patient will have abdominal CT to rule out appendicitis. Patient will have fluids and zofran.   12:36 PM Patient feeling better and CT scan is unremarkable. Patient is afebrile with stable vitals. Patient likely has a viral illness. Patient will be discharged without further evaluation.       [name], PA-C 08/20/12 [phone]

## 2012-08-20 NOTE — ED Notes (Signed)
Pt presented with abdominal pain with nausea and loss stool.Pt had a stomach bug last week.

## 2012-08-21 LAB — URINE CULTURE
Colony Count: NO GROWTH
Special Requests: NORMAL

## 2012-08-24 ENCOUNTER — Emergency Department (HOSPITAL_COMMUNITY)
Admission: EM | Admit: 2012-08-24 | Discharge: 2012-08-24 | Disposition: A | Discharge status: Home / Self Care | Payer: 59 | Attending: Emergency Medicine | Admitting: Emergency Medicine

## 2012-08-24 ENCOUNTER — Encounter (HOSPITAL_COMMUNITY): Payer: Self-pay | Admitting: Emergency Medicine

## 2012-08-24 DIAGNOSIS — K529 Noninfective gastroenteritis and colitis, unspecified: Secondary | ICD-10-CM

## 2012-08-24 DIAGNOSIS — Z8639 Personal history of other endocrine, nutritional and metabolic disease: Secondary | ICD-10-CM | POA: Insufficient documentation

## 2012-08-24 DIAGNOSIS — Z87891 Personal history of nicotine dependence: Secondary | ICD-10-CM | POA: Insufficient documentation

## 2012-08-24 DIAGNOSIS — Z79899 Other long term (current) drug therapy: Secondary | ICD-10-CM | POA: Insufficient documentation

## 2012-08-24 DIAGNOSIS — I10 Essential (primary) hypertension: Secondary | ICD-10-CM | POA: Insufficient documentation

## 2012-08-24 DIAGNOSIS — Z880 Allergy status to penicillin: Secondary | ICD-10-CM | POA: Insufficient documentation

## 2012-08-24 DIAGNOSIS — Z8659 Personal history of other mental and behavioral disorders: Secondary | ICD-10-CM | POA: Insufficient documentation

## 2012-08-24 DIAGNOSIS — Z8669 Personal history of other diseases of the nervous system and sense organs: Secondary | ICD-10-CM | POA: Insufficient documentation

## 2012-08-24 DIAGNOSIS — K5289 Other specified noninfective gastroenteritis and colitis: Secondary | ICD-10-CM | POA: Insufficient documentation

## 2012-08-24 DIAGNOSIS — R5383 Other fatigue: Secondary | ICD-10-CM | POA: Insufficient documentation

## 2012-08-24 DIAGNOSIS — Z9889 Other specified postprocedural states: Secondary | ICD-10-CM | POA: Insufficient documentation

## 2012-08-24 DIAGNOSIS — Z862 Personal history of diseases of the blood and blood-forming organs and certain disorders involving the immune mechanism: Secondary | ICD-10-CM | POA: Insufficient documentation

## 2012-08-24 DIAGNOSIS — E860 Dehydration: Secondary | ICD-10-CM

## 2012-08-24 DIAGNOSIS — R5381 Other malaise: Secondary | ICD-10-CM | POA: Insufficient documentation

## 2012-08-24 LAB — CBC
HCT: 35.6 % — ABNORMAL LOW (ref 36.0–46.0)
Hemoglobin: 12.5 g/dL (ref 12.0–15.0)
MCH: 29.8 pg (ref 26.0–34.0)
MCV: 84.8 fL (ref 78.0–100.0)
RBC: 4.2 MIL/uL (ref 3.87–5.11)

## 2012-08-24 LAB — BASIC METABOLIC PANEL
CO2: 28 mEq/L (ref 19–32)
Calcium: 9.4 mg/dL (ref 8.4–10.5)
Creatinine, Ser: 0.77 mg/dL (ref 0.50–1.10)
Glucose, Bld: 94 mg/dL (ref 70–99)

## 2012-08-24 MED ORDER — SODIUM CHLORIDE 0.9 % IV BOLUS (SEPSIS)
1000.0000 mL | Freq: Once | INTRAVENOUS | Status: AC
  Administered 2012-08-24: 1000 mL via INTRAVENOUS

## 2012-08-24 NOTE — ED Provider Notes (Signed)
Medical screening examination/treatment/procedure(s) were conducted as a shared visit with non-physician practitioner(s) and myself.  I personally evaluated the patient during the encounter.  No acute abdomen. CT scan shows no acute appendicitis or other acute finding  Donnetta Hutching, MD 08/24/12 1521

## 2012-08-24 NOTE — ED Notes (Signed)
nad noted prior to dc. Dc instructions reviewed. resp even/nonlabored. Ambulated out without difficulty.

## 2012-08-24 NOTE — ED Provider Notes (Addendum)
History     CSN: [id number]  Arrival date & time 08/24/12  1933   First MD Initiated Contact with Patient 08/24/12 2008      Chief Complaint  Patient presents with  . Diarrhea    states her MD sent her here for IV fluids    (Consider location/radiation/quality/duration/timing/severity/associated sxs/prior treatment) Patient is a 32 y.o. female presenting with diarrhea. The history is provided by the patient (the pt complains of diarhea and weakness.  her md is to see her this week.  he has gotten c-diff test). No language interpreter was used.  Diarrhea Quality:  Watery Severity:  Moderate Onset quality:  Gradual Timing:  Constant Progression:  Unchanged Relieved by:  Nothing Associated symptoms: no abdominal pain and no headaches     Past Medical History  Diagnosis Date  . Hypertension   . Anxiety   . Headache   . Thyroid nodule   . Neuromuscular disorder     sus  . PONV (postoperative nausea and vomiting)     Past Surgical History  Procedure Laterality Date  . Cesarean section    . Tympanoplasty    . Cesarean section    . Tonsillectomy    . Thyroidectomy  06/18/2011    Procedure: THYROIDECTOMY;  Surgeon: Christia Reading, MD;  Location: [facility] OR;  Service: ENT;  Laterality: Right;    No family history on file.  History  Substance Use Topics  . Smoking status: Former Games developer  . Smokeless tobacco: Not on file  . Alcohol Use: Yes     Comment: 1/month    OB History   Grav Para Term Preterm Abortions TAB SAB Ect Mult Living                  Review of Systems  Constitutional: Negative for appetite change and fatigue.  HENT: Negative for congestion, sinus pressure and ear discharge.   Eyes: Negative for discharge.  Respiratory: Negative for cough.   Cardiovascular: Negative for chest pain.  Gastrointestinal: Positive for diarrhea. Negative for abdominal pain.  Genitourinary: Negative for frequency and hematuria.  Musculoskeletal: Negative for back pain.   Skin: Negative for rash.  Neurological: Negative for seizures and headaches.  Psychiatric/Behavioral: Negative for hallucinations.    Allergies  Darvocet; Sulfa antibiotics; Cephalexin; Imitrex; and Penicillins  Home Medications   Current Outpatient Rx  Name  Route  Sig  Dispense  Refill  . dicyclomine (BENTYL) 20 MG tablet   Oral   Take 1 tablet (20 mg total) by mouth 2 (two) times daily.   20 tablet   0   . ondansetron (ZOFRAN ODT) 4 MG disintegrating tablet   Oral   Take 1 tablet (4 mg total) by mouth every 8 (eight) hours as needed for nausea.   10 tablet   0   . levothyroxine (SYNTHROID, LEVOTHROID) 88 MCG tablet   Oral   Take 88 mcg by mouth daily before breakfast.         . propranolol ER (INDERAL LA) 80 MG 24 hr capsule   Oral   Take 80 mg by mouth daily.           BP 135/94  Pulse 73  Temp(Src) 98.6 F (37 C) (Oral)  Resp 24  Ht 5\' 9"  (1.753 m)  Wt 218 lb (98.884 kg)  BMI 32.18 kg/m2  SpO2 100%  LMP 08/15/2012  Physical Exam  Constitutional: She is oriented to person, place, and time. She appears well-developed.  HENT:  Head:  Normocephalic.  Eyes: Conjunctivae and EOM are normal. No scleral icterus.  Neck: Neck supple. No thyromegaly present.  Cardiovascular: Normal rate and regular rhythm.  Exam reveals no gallop and no friction rub.   No murmur heard. Pulmonary/Chest: No stridor. She has no wheezes. She has no rales. She exhibits no tenderness.  Abdominal: She exhibits no distension. There is no tenderness. There is no rebound.  Musculoskeletal: Normal range of motion. She exhibits no edema.  Lymphadenopathy:    She has no cervical adenopathy.  Neurological: She is oriented to person, place, and time. Coordination normal.  Skin: No rash noted. No erythema.  Psychiatric: She has a normal mood and affect. Her behavior is normal.    ED Course  Procedures (including critical care time)  Labs Reviewed  CBC - Abnormal; Notable for the  following:    HCT 35.6 (*)    All other components within normal limits  BASIC METABOLIC PANEL   No results found.   1. Gastroenteritis   2. Dehydration       MDM          Benny Lennert, MD 08/24/12 2127  Benny Lennert, MD 09/05/12 [phone]

## 2012-08-24 NOTE — ED Notes (Signed)
MD sent her here for fluids.  Had IV fluids this past week in MD office.  Continues to have frequent diarrhea (tests pending from MD office for C-diff)  Occasional nausea with vomiting when abdominal cramps increase

## 2012-08-27 ENCOUNTER — Other Ambulatory Visit: Payer: Self-pay | Admitting: Nurse Practitioner

## 2012-09-18 ENCOUNTER — Other Ambulatory Visit: Payer: Self-pay | Admitting: Gastroenterology

## 2012-09-24 ENCOUNTER — Encounter: Payer: Self-pay | Admitting: *Deleted

## 2012-09-26 ENCOUNTER — Ambulatory Visit (INDEPENDENT_AMBULATORY_CARE_PROVIDER_SITE_OTHER): Payer: 59 | Admitting: Nurse Practitioner

## 2012-09-26 ENCOUNTER — Other Ambulatory Visit: Payer: Self-pay | Admitting: Family Medicine

## 2012-09-26 ENCOUNTER — Encounter: Payer: Self-pay | Admitting: Nurse Practitioner

## 2012-09-26 VITALS — BP 114/74 | HR 80 | Wt 219.0 lb

## 2012-09-26 DIAGNOSIS — F419 Anxiety disorder, unspecified: Secondary | ICD-10-CM

## 2012-09-26 DIAGNOSIS — F411 Generalized anxiety disorder: Secondary | ICD-10-CM

## 2012-09-26 MED ORDER — SERTRALINE HCL 50 MG PO TABS: ORAL_TABLET | ORAL | Status: DC

## 2012-09-29 ENCOUNTER — Telehealth: Payer: Self-pay | Admitting: Nurse Practitioner

## 2012-09-29 ENCOUNTER — Other Ambulatory Visit: Payer: Self-pay | Admitting: Family Medicine

## 2012-09-29 DIAGNOSIS — F410 Panic disorder [episodic paroxysmal anxiety] without agoraphobia: Secondary | ICD-10-CM

## 2012-09-29 NOTE — Progress Notes (Signed)
Patient had side effects do to Zoloft. She stated she took it only for a few days and it made her feel sad at times and even had suicidal thoughts. She states she is not suicidal. She stopped the medicine she's starting to feel better she has significant anxiety issues I believe she would benefit with some counseling. Xanax for occasional use followup here if any problems.

## 2012-09-29 NOTE — Telephone Encounter (Signed)
Called in RX to pharmacy

## 2012-09-29 NOTE — Telephone Encounter (Signed)
Stop med, we will intiate counseling, please get number I can call her at later, if worse immediaqtely call us.

## 2012-09-29 NOTE — Telephone Encounter (Signed)
Patient states that this is the first time she has ever taken an antidepressant. She has been experiencing dizziness, lethargic, suicidal thoughts, not able to sleep and crying spells for no reason. She has stopped taking the zoloft. She states that she is also willing to go to counseling if you think this will help her.

## 2012-09-29 NOTE — Telephone Encounter (Signed)
Happy to address, first- what side effect is she having? (nausea common in the first week) second- any meds she has had side effects with other than zoloft?

## 2012-09-29 NOTE — Telephone Encounter (Signed)
Notified patient stop med, we will intiate counseling, if worse immediately call us. Patient's cell number is [phone]. She takes lunch from 1-2 pm and she gets off work at 5 pm. She is expecting your call.

## 2012-09-29 NOTE — Telephone Encounter (Signed)
Pt would like to say that  The zoloft side effects have really been bad this weekend, she just started the medication on 09/26/12. Please have Dr Lorin Picket review since Eber Jones won't be in till Thursday this week, pt feels she needs to change something. Eber Jones told her to call if she had side effects to try a different one. [facility] [employer]

## 2012-09-29 NOTE — Telephone Encounter (Signed)
I talked with patient extensively about her issue. She does have panic attacks. I told her to stop the Zoloft. We will help arrange for her some counseling I will put this order in. Please call in for the patient Xanax 0.5 mg tablet a half to one whole tablet twice a day when necessary cautioned drowsiness #30 no refills

## 2012-10-01 ENCOUNTER — Encounter: Payer: Self-pay | Admitting: Nurse Practitioner

## 2012-10-01 DIAGNOSIS — F419 Anxiety disorder, unspecified: Secondary | ICD-10-CM | POA: Insufficient documentation

## 2012-10-01 NOTE — Assessment & Plan Note (Signed)
Start Zoloft 50 mg a half tab by mouth daily for several days and if tolerated increase to one by mouth daily. Reviewed potential adverse effects. DC med and call if any problems. Otherwise recheck in one month.

## 2012-10-01 NOTE — Progress Notes (Signed)
Subjective:  Presents for recheck. Having occasional episodes of anxiety. Weight gain. Mild OCD symptoms. Has had problems off and on with this in the past. According to our notes she has taken Zoloft before, patient is unsure if she had any side effects. According to our records her most recent problems with anxiety was in 2010. Denies suicidal or homicidal thoughts or ideation.  Objective:   BP 114/74  Pulse 80  Wt 219 lb (99.338 kg)  BMI 32.33 kg/m2 NAD. Alert, oriented. Mildly anxious affect. Lungs clear. Heart regular rate rhythm.  Assessment: Start Zoloft 50 mg a half tab by mouth daily for several days and if tolerated increase to one by mouth daily. Reviewed potential adverse effects. DC med and call if any problems. Otherwise recheck in one month.

## 2012-10-31 ENCOUNTER — Ambulatory Visit: Payer: 59 | Admitting: Nurse Practitioner

## 2012-11-26 ENCOUNTER — Other Ambulatory Visit: Payer: Self-pay | Admitting: Nurse Practitioner

## 2013-01-05 ENCOUNTER — Other Ambulatory Visit: Payer: Self-pay | Admitting: Family Medicine

## 2013-01-28 ENCOUNTER — Encounter: Payer: Self-pay | Admitting: Endocrinology

## 2013-01-28 ENCOUNTER — Ambulatory Visit (INDEPENDENT_AMBULATORY_CARE_PROVIDER_SITE_OTHER): Payer: 59 | Admitting: Endocrinology

## 2013-01-28 ENCOUNTER — Telehealth: Payer: Self-pay | Admitting: Family Medicine

## 2013-01-28 VITALS — BP 118/80 | HR 75 | Ht 69.0 in | Wt 221.0 lb

## 2013-01-28 DIAGNOSIS — F419 Anxiety disorder, unspecified: Secondary | ICD-10-CM

## 2013-01-28 DIAGNOSIS — F411 Generalized anxiety disorder: Secondary | ICD-10-CM

## 2013-01-28 MED ORDER — LEVOTHYROXINE SODIUM 75 MCG PO TABS: 75.0000 ug | ORAL_TABLET | Freq: Every day | ORAL | Status: DC

## 2013-01-28 NOTE — Patient Instructions (Addendum)
It is ok to reduce the levothyroxine to 75 mcg/day.  i have sent a prescription to your pharmacy. Also, Refer to a psychiatry specialist.  you will receive a phone call, about a day and time for an appointment. Please recheck the blood test in 1 month.

## 2013-01-28 NOTE — Progress Notes (Signed)
Subjective:    Patient ID: Lisa [name], female    DOB: Apr 13, 1981, 32 y.o.   MRN: [id number]  HPI In early 2013, pt was incidentally noted on MRI to have a slight nodule at the right lobe of the thyroid, but no assoc pain.  She underwent a right lobectomy.  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  She was started on synthroid a few months later, due to an abnormal blood test.  Since then, she has exacerbation of her chronic anxiety.  Her husband has had a vasectomy.   Past Medical History  Diagnosis Date  . Hypertension   . Anxiety   . Headache(784.0)   . Thyroid nodule   . Neuromuscular disorder     sus  . PONV (postoperative nausea and vomiting)     Past Surgical History  Procedure Laterality Date  . Cesarean section    . Tympanoplasty    . Cesarean section    . Tonsillectomy    . Thyroidectomy  06/18/2011    Procedure: THYROIDECTOMY;  Surgeon: Christia Reading, MD;  Location: [facility] OR;  Service: ENT;  Laterality: Right;  . Adenoidectomy      History   Social History  . Marital Status: Married    Spouse Name: N/A    Number of Children: N/A  . Years of Education: N/A   Occupational History  . Not on file.   Social History Main Topics  . Smoking status: Former Games developer  . Smokeless tobacco: Not on file  . Alcohol Use: Yes     Comment: 1/month  . Drug Use: No  . Sexual Activity: Yes    Birth Control/ Protection: None   Other Topics Concern  . Not on file   Social History Narrative  . No narrative on file    Current Outpatient Prescriptions on File Prior to Visit  Medication Sig Dispense Refill  . levothyroxine (SYNTHROID, LEVOTHROID) 88 MCG tablet Take 88 mcg by mouth daily before breakfast.      . propranolol ER (INDERAL LA) 80 MG 24 hr capsule TAKE 1 CAPSULE BY MOUTH EVERY DAY  30 capsule  0   No current facility-administered medications on file prior to visit.    Allergies  Allergen Reactions  . Darvocet [Propoxyphene-Acetaminophen]  Other (See Comments)    Hallucination  . Keflex [Cephalexin]   . Sulfa Antibiotics     Increased BP  . Cephalexin Rash  . Imitrex [Sumatriptan Base] Other (See Comments)    States "makes my jaw lock up"  . Penicillins Rash    No family history on file. Father has hypothyroidism BP 118/80  Pulse 75  Ht 5\' 9"  (1.753 m)  Wt 221 lb (100.245 kg)  BMI 32.62 kg/m2  SpO2 97%  Review of Systems She has headache (inderal helps), easy bruising, and weight gain.  She has irreg menses.  She denies hoarseness, double vision, palpitations, sob, diarrhea, polyuria, myalgias, excessive diaphoresis, numbness, tremor, and rhinorrhea.    Objective:   Physical Exam VS: see vs page GEN: no distress HEAD: head: no deformity eyes: no periorbital swelling, no proptosis external nose and ears are normal mouth: no lesion seen NECK: a healed scar is present.  i do not appreciate a nodule in the thyroid or elsewhere in the neck CHEST WALL: no deformity LUNGS:  Clear to auscultation CV: reg rate and rhythm, no murmur ABD: abdomen is soft, nontender.  no hepatosplenomegaly.  not distended.  no hernia MUSCULOSKELETAL: muscle bulk and strength are  grossly normal.  no obvious joint swelling.  gait is normal and steady EXTEMITIES: no deformity.  Trace bilat leg edema PULSES: no carotid bruit NEURO:  cn 2-12 grossly intact.   readily moves all 4's.  sensation is intact to touch on all 4's  No tremor SKIN:  Normal texture and temperature.  No rash or suspicious lesion is visible.  Not diaphoretic.  NODES:  None palpable at the neck PSYCH: alert, oriented x3.  Does not appear anxious nor depressed.   outside test results are reviewed: TSH=2.1    Assessment & Plan:  Hypothyroidism: despite normal TFT, i have advised pt of the risks (and lack of benefit) of armour thyroid. Edema, mild, possibly due to inderal H/o thyroid nodule, s/p resection.  Benign on pathology.

## 2013-01-28 NOTE — Telephone Encounter (Signed)
Patient inquiring about her referral

## 2013-01-29 NOTE — Telephone Encounter (Signed)
Spoke with pt.  Explained that I left her a message to return my call back in June to discuss who she wanted to see, gave her the names & numbers of some counselors that I was given from her [facility] coverage, she will call and set up and will let me know if we need to do anything else

## 2013-01-31 NOTE — Progress Notes (Signed)
  Subjective:    Patient ID: Lisa [name], female    DOB: 1980/05/11, 32 y.o.   MRN: [id number]  HPI    Review of Systems     Objective:   Physical Exam        Assessment & Plan:  The armour thyroid reference is an error.  She will continue synthroid

## 2013-02-11 ENCOUNTER — Other Ambulatory Visit: Payer: Self-pay | Admitting: Family Medicine

## 2013-02-19 ENCOUNTER — Other Ambulatory Visit: Payer: Self-pay

## 2013-02-26 ENCOUNTER — Encounter (HOSPITAL_COMMUNITY): Payer: Self-pay | Admitting: Psychology

## 2013-02-26 ENCOUNTER — Ambulatory Visit (INDEPENDENT_AMBULATORY_CARE_PROVIDER_SITE_OTHER): Payer: 59 | Admitting: Psychology

## 2013-02-26 DIAGNOSIS — F411 Generalized anxiety disorder: Secondary | ICD-10-CM

## 2013-02-26 NOTE — Progress Notes (Signed)
Patient:   Lisa [name]   DOB:   11-26-1980  MR Number:  [id number]  Location:  [facility] [address] [place] [address] Dept: [phone]           Date of Service:   02/26/2013  Start Time:   2 PM End Time:   3 PM  Provider/Observer:  Hershal Coria PSYD       Billing Code/Service: [phone]  Chief Complaint:     Chief Complaint  Patient presents with  . Anxiety    Reason for Service:  The patient was referred because of significant issues of anxiety. She has been followed by Dr. Eileen Stanford for many years because of anxiety and had symptoms similar to panic attacks when she was a teenager but has had none of them in quite some time. However, she has had increasing anxiety and some lower levels of symptoms of depression. The patient reports that while she is always been an anxious person and the symptoms were present since high school she has been doing worse recently. Back when she was in high school she had a prescription for Xanax for what were diagnosed as panic attacks. However, she reports that she would only take Xanax 1 or 2 times per year. The patient reports that she had been doing okay and managing these symptoms but lately she's been feeling more anxious and more fatigued. She was tried on Zoloft but developed very vivid dreams that were negative in nature within the first 3-4 days and this was discontinued. She was seen by a psychiatrist in [place] who diagnosed with bipolar disorder and she was started on Depakote and Lamictal. The patient reports that she again had some bad responses to these medications and they were discontinued and really did not feel that bipolar disorder was an accurate diagnosis. 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 She has 2 children who are 50 and 71 years  old and has a lot of work and job stress as well as some strength at home. She is having intrusive worries and fears about her husband stability even though there actually no objective signs or indications of this. She feels that this is been asked to strain on her marriage.  Current Status:  The patient describes moderate to significant symptoms of anxiety and mood changes as well as racing thoughts. Irritability and agitation or reported. Most of these are consistent with her more global diagnoses are descriptions of anxiety.  Reliability of Information: Information was provided by both the patient as well as review of available medical records.  Behavioral Observation: MINYON BILLITER  presents as a 32 y.o.-year-old Right Caucasian Female who appeared her stated age. her dress was Appropriate and she was Well Groomed and her manners were Appropriate to the situation.  There were not any physical disabilities noted.  she displayed an appropriate level of cooperation and motivation.    Interactions:    Active   Attention:   within normal limits  Memory:   within normal limits  Visuo-spatial:   within normal limits  Speech (Volume):  normal  Speech:   normal pitch  Thought Process:  Coherent  Though Content:  WNL  Orientation:   person, place, time/date and situation  Judgment:   Good  Planning:   Good  Affect:    Anxious  Mood:    Anxious  Insight:   Good  Intelligence:   high  Marital Status/Living: The patient was born and raised in [place]. Her parents continue to be married. Her father is 2 years old and in poor health her mother is 6 years old in good health. The patient is married to her only husband and was married in 2005. Her anxiety has been stress on the marriage but they are doing well and have a good marriage. She has 2 girls ages 24 and 30 years old.  Current Employment: The patient has been working as a Sales executive for a Conservation officer, historic buildings and as been there for the past 10 years. She reports that there are some significant stressors at work to have to do with issues with one of the dentist there but they're trying to work on some of these issues. Her husband has been working as a Radiation protection practitioner for the past 5 years. She does report that there is some financial stressors that are being managed.  Substance Use:  No concerns of substance abuse are reported.    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  Medical History:   Past Medical History  Diagnosis Date  . Hypertension   . Anxiety   . Headache(784.0)   . Thyroid nodule   . Neuromuscular disorder     sus  . PONV (postoperative nausea and vomiting)         Outpatient Encounter Prescriptions as of 02/26/2013  Medication Sig  . levothyroxine (SYNTHROID, LEVOTHROID) 75 MCG tablet Take 1 tablet (75 mcg total) by mouth daily.  . Lorcaserin HCl (BELVIQ) 10 MG TABS Take by mouth.  . propranolol ER (INDERAL LA) 80 MG 24 hr capsule TAKE 1 CAPSULE BY MOUTH EVERY DAY          Sexual History:   History  Sexual Activity  . Sexual Activity: Yes  . Birth Control/ Protection: None    Abuse/Trauma History: The patient denies any history of traumatic experiences or abuse.  Psychiatric History:  The patient has had issues with anxiety going back to her teenage years. She has been treated with low-dose infrequent usage of benzodiazepine and but has not taken any psychotropic medications right now. She did see a psychiatrist at one point to thought she may have bipolar disorder but her descriptions of symptoms now do not as to what that diagnosis she has been tried on Zoloft at one point but only took a few days as it triggered very vivid dreams that were nightmares in nature for her. She had a similar response to Depakote and Lamictal.  Family Med/Psych History: No family history on  file.  Risk of Suicide/Violence: The patient denies any suicidal or homicidal ideation. However, she does have intrusive thoughts and fears that she might harm herself in some way even though she has no conscious impulsive desire to do such. These are more intrusive thoughts in nature. She has had nightmares of harming himself.   Impression/DX:  The patient is a fairly long history of significant anxiety symptoms that go back to her teenage years. While there have been some considerations of other diagnostic considerations I do think the diagnosis of generalized anxiety disorder her. The events that she described as being panic attacks in her teenage years may not a reason to the level of a full-blown panic attack and general work of  significant anxiety events. She has developed no symptoms agoraphobia but she does have a number of years and intrusive thoughts typically at night and her husband is working and fears of and inferior coming in the house. She denies any compulsive behaviors but does have some obsessive thoughts at times.  Disposition/Plan:  We will set the patient up for individual psychotherapeutic intervention and continue to assess the possibility of any further medication interventions. However, right now the patient would like to not involve psychotropic interventions unless they become warranted after other efforts are taken.  Diagnosis:    Axis I:  Generalized anxiety disorder

## 2013-03-03 ENCOUNTER — Other Ambulatory Visit: Payer: 59

## 2013-03-25 ENCOUNTER — Ambulatory Visit (HOSPITAL_COMMUNITY): Payer: Self-pay | Admitting: Psychology

## 2013-04-06 ENCOUNTER — Ambulatory Visit (HOSPITAL_COMMUNITY): Payer: Self-pay | Admitting: Psychology

## 2013-10-11 ENCOUNTER — Other Ambulatory Visit: Payer: Self-pay | Admitting: Family Medicine

## 2013-11-22 ENCOUNTER — Other Ambulatory Visit: Payer: Self-pay | Admitting: Family Medicine

## 2013-11-23 ENCOUNTER — Telehealth: Payer: Self-pay | Admitting: Family Medicine

## 2013-11-23 MED ORDER — PROPRANOLOL HCL ER 80 MG PO CP24: ORAL_CAPSULE | ORAL | Status: DC

## 2013-11-23 NOTE — Telephone Encounter (Signed)
Notified patient.

## 2013-11-23 NOTE — Telephone Encounter (Signed)
Med sent to pharmacy. Left message on voicemail notifying patient and needs follow up office visit.

## 2013-11-23 NOTE — Telephone Encounter (Signed)
Patient needs Rx for propranolol ER (INDERAL LA) 80 MG 24 hr capsule to [facility].

## 2013-12-03 ENCOUNTER — Ambulatory Visit: Payer: 59 | Admitting: Nurse Practitioner

## 2013-12-25 ENCOUNTER — Ambulatory Visit: Payer: 59 | Admitting: Family Medicine

## 2013-12-25 ENCOUNTER — Ambulatory Visit (INDEPENDENT_AMBULATORY_CARE_PROVIDER_SITE_OTHER): Payer: 59 | Admitting: Family Medicine

## 2013-12-25 ENCOUNTER — Encounter: Payer: Self-pay | Admitting: Family Medicine

## 2013-12-25 VITALS — BP 122/78 | Ht 69.0 in | Wt 235.0 lb

## 2013-12-25 DIAGNOSIS — G5790 Unspecified mononeuropathy of unspecified lower limb: Secondary | ICD-10-CM

## 2013-12-25 DIAGNOSIS — M792 Neuralgia and neuritis, unspecified: Secondary | ICD-10-CM

## 2013-12-25 DIAGNOSIS — R29898 Other symptoms and signs involving the musculoskeletal system: Secondary | ICD-10-CM

## 2013-12-25 DIAGNOSIS — M5430 Sciatica, unspecified side: Secondary | ICD-10-CM

## 2013-12-25 DIAGNOSIS — M5431 Sciatica, right side: Secondary | ICD-10-CM

## 2013-12-25 NOTE — Progress Notes (Addendum)
   Subjective:    Patient ID: Lisa [name], female    DOB: 01-Sep-1980, 33 y.o.   MRN: [id number]  HPI Numbness in Right Leg over 1 month. Patient stated it started as a tingling in the leg now progressed to a numbness from hip down to back of calf.  Patient states this is her only concern. Mrs. Lisa [name] progressive causing weakness in the legs difficulty going up and down steps walking. Difficulty driving. She has tried anti-inflammatories. Started a couple months ago was become worse she does have subjective low back pain and discomfort with movement.   Review of Systems No loss of bowel or bladder control no fevers no abdominal pain positive back pain positive leg numbness and weakness    Objective:   Physical Exam Her lungs are clear heart regular she does have weakness in the quadriceps on the right side compared to the left side 3+ over 5. There is no foot drop. Reflexes brisk on the right side. Subjective numbness down the right anterior portion of the thigh down into the lateral aspect of the right leg into the foot       Assessment & Plan:  Increasing numbness and weakness along with back discomfort I think it's reasonable to do an MRI of the back to help see if there is a herniated disc. She has tried anti-inflammatories and rest over the past 2 months but it's been progressive. May need to refer her to neurosurgery  MRI as clinically indicated for right leg sciatica and right leg weakness see above discussion  She has had unusual findings on MRI of the brain several years ago she had to see Dr. Donald Pore at [facility]. I don't feel she needs to see her currently but this is a possibility

## 2014-01-01 ENCOUNTER — Other Ambulatory Visit (HOSPITAL_COMMUNITY): Payer: Self-pay

## 2014-01-01 ENCOUNTER — Ambulatory Visit (HOSPITAL_COMMUNITY)
Admission: RE | Admit: 2014-01-01 | Discharge: 2014-01-01 | Disposition: A | Discharge status: Home / Self Care | Payer: 59 | Source: Ambulatory Visit | Attending: Family Medicine | Admitting: Family Medicine

## 2014-01-01 DIAGNOSIS — R209 Unspecified disturbances of skin sensation: Secondary | ICD-10-CM | POA: Diagnosis not present

## 2014-01-01 DIAGNOSIS — M5144 Schmorl's nodes, thoracic region: Secondary | ICD-10-CM | POA: Diagnosis not present

## 2014-01-01 DIAGNOSIS — M48061 Spinal stenosis, lumbar region without neurogenic claudication: Secondary | ICD-10-CM | POA: Diagnosis not present

## 2014-01-01 DIAGNOSIS — M545 Low back pain, unspecified: Secondary | ICD-10-CM | POA: Insufficient documentation

## 2014-01-01 DIAGNOSIS — M5126 Other intervertebral disc displacement, lumbar region: Secondary | ICD-10-CM | POA: Insufficient documentation

## 2014-01-01 DIAGNOSIS — M6281 Muscle weakness (generalized): Secondary | ICD-10-CM | POA: Insufficient documentation

## 2014-01-06 NOTE — Addendum Note (Signed)
Addended by: Carmelina Noun on: 01/06/2014 08:26 AM   Modules accepted: Orders

## 2014-02-06 ENCOUNTER — Other Ambulatory Visit: Payer: Self-pay | Admitting: Family Medicine

## 2014-02-17 ENCOUNTER — Telehealth: Payer: Self-pay | Admitting: Family Medicine

## 2014-02-17 NOTE — Telephone Encounter (Signed)
A copy of her encounter from September 11 was printed. This does include the codes at the top of the sheet. You may give this to the patient. Technically she should sign for it, no charge

## 2014-02-17 NOTE — Telephone Encounter (Signed)
Done

## 2014-02-17 NOTE — Telephone Encounter (Signed)
Patient said that her insurance company is requiring for her to have a diagnosis code sent to them regarding the MRI that she had done on 01/01/14.  Can we do this for her?

## 2014-03-23 ENCOUNTER — Other Ambulatory Visit: Payer: Self-pay | Admitting: Family Medicine

## 2014-04-22 ENCOUNTER — Other Ambulatory Visit: Payer: Self-pay | Admitting: Family Medicine

## 2014-05-03 ENCOUNTER — Encounter: Payer: Self-pay | Admitting: Nurse Practitioner

## 2014-05-03 ENCOUNTER — Ambulatory Visit (INDEPENDENT_AMBULATORY_CARE_PROVIDER_SITE_OTHER): Payer: 59 | Admitting: Nurse Practitioner

## 2014-05-03 VITALS — BP 132/86 | Temp 98.4°F | Ht 69.0 in | Wt 245.0 lb

## 2014-05-03 DIAGNOSIS — J0100 Acute maxillary sinusitis, unspecified: Secondary | ICD-10-CM

## 2014-05-03 MED ORDER — LEVOFLOXACIN 500 MG PO TABS: 500.0000 mg | ORAL_TABLET | Freq: Every day | ORAL | Status: DC

## 2014-05-03 MED ORDER — PREDNISONE 20 MG PO TABS: ORAL_TABLET | ORAL | Status: DC

## 2014-05-03 MED ORDER — METHYLPREDNISOLONE ACETATE 40 MG/ML IJ SUSP
40.0000 mg | Freq: Once | INTRAMUSCULAR | Status: AC
  Administered 2014-05-03: 40 mg via INTRAMUSCULAR

## 2014-05-03 MED ORDER — PROPRANOLOL HCL ER 80 MG PO CP24: ORAL_CAPSULE | ORAL | Status: DC

## 2014-05-04 ENCOUNTER — Encounter: Payer: Self-pay | Admitting: Nurse Practitioner

## 2014-05-04 NOTE — Progress Notes (Signed)
Subjective:  Presents for c/o cough, sore throat and chest congestion that began 4 days ago. Fever resolved. Maxillary area headache and pressure. Cough worse at night. producing thick green mucus. Sore throat at night and AM. No ear pain or wheezing. Also very rare migraine while on Propranolol. Requesting refill.   Objective:   BP 132/86 mmHg  Temp(Src) 98.4 F (36.9 C)  Ht 5\' 9"  (1.753 m)  Wt 245 lb (111.131 kg)  BMI 36.16 kg/m2 NAD. Alert, oriented. TMs clear effusion. Pharynx injected with green PND noted. Neck supple with mild anterior adenopathy. Lungs clear. Heart RRR.   Assessment: Acute maxillary sinusitis, recurrence not specified - Plan: methylPREDNISolone acetate (DEPO-MEDROL) injection 40 mg  Plan:  Meds ordered this encounter  Medications  . levofloxacin (LEVAQUIN) 500 MG tablet    Sig: Take 1 tablet (500 mg total) by mouth daily.    Dispense:  10 tablet    Refill:  0    Order Specific Question:  Supervising Provider    Answer:  Mikey Kirschner [[id number]]  . predniSONE (DELTASONE) 20 MG tablet    Sig: 3 po qd x 3 d then 2 po qd x 3 d then 1 po qd x 3 d    Dispense:  18 tablet    Refill:  0    Order Specific Question:  Supervising Provider    Answer:  Mikey Kirschner [[id number]]  . propranolol ER (INDERAL LA) 80 MG 24 hr capsule    Sig: TAKE 1 CAPSULE BY MOUTH EVERY DAY    Dispense:  30 capsule    Refill:  11    Order Specific Question:  Supervising Provider    Answer:  Mikey Kirschner [[id number]]  . methylPREDNISolone acetate (DEPO-MEDROL) injection 40 mg    Sig:    OTC meds as directed for cough. Call back if worsens or persists.

## 2014-05-05 ENCOUNTER — Encounter: Payer: Self-pay | Admitting: Family Medicine

## 2014-07-16 ENCOUNTER — Ambulatory Visit (INDEPENDENT_AMBULATORY_CARE_PROVIDER_SITE_OTHER): Payer: 59 | Admitting: Nurse Practitioner

## 2014-07-16 ENCOUNTER — Encounter: Payer: Self-pay | Admitting: Nurse Practitioner

## 2014-07-16 VITALS — BP 108/70 | Ht 69.0 in | Wt 242.0 lb

## 2014-07-16 DIAGNOSIS — E039 Hypothyroidism, unspecified: Secondary | ICD-10-CM

## 2014-07-16 DIAGNOSIS — G43009 Migraine without aura, not intractable, without status migrainosus: Secondary | ICD-10-CM | POA: Diagnosis not present

## 2014-07-16 MED ORDER — LEVOTHYROXINE SODIUM 100 MCG PO TABS: 100.0000 ug | ORAL_TABLET | Freq: Every day | ORAL | Status: DC

## 2014-07-19 ENCOUNTER — Encounter: Payer: Self-pay | Admitting: Nurse Practitioner

## 2014-07-19 NOTE — Progress Notes (Signed)
Subjective:  Presents for recheck. Headaches totally under control with propranolol. Mild swelling in hands and feet. No CP/SOB or orthopnea. No change in sodium intake. Off/on fatigue. Feels like her thyroid med fluctuates. Some weight gain. Has a "white haze" in her vision at times; no specific triggers. No numbness or weakness of face, arms or legs. No difficulty speaking or swallowing. Has an appt in 3 days with optometrist.   Objective:   BP 108/70 mmHg  Ht 5\' 9"  (1.753 m)  Wt 242 lb (109.77 kg)  BMI 35.72 kg/m2 NAD. Alert, oriented. Lungs clear. Heart RRR. Left side of thyroid non tender without obvious masses. Lower extremities trace edema.   Assessment:  Problem List Items Addressed This Visit      Cardiovascular and Mediastinum   Migraine without aura and without status migrainosus, not intractable     Endocrine   Thyroid activity decreased - Primary   Relevant Medications   levothyroxine (SYNTHROID, LEVOTHROID) tablet   Other Relevant Orders   TSH     Plan:  Meds ordered this encounter  Medications  . levothyroxine (SYNTHROID) 100 MCG tablet    Sig: Take 1 tablet (100 mcg total) by mouth daily before breakfast.    Dispense:  30 tablet    Refill:  2    DISPENSE NAME BRAND SYNTHROID    Order Specific Question:  Supervising Provider    Answer:  Mikey Kirschner [[id number]]   Switch to name brand Synthroid. Follow up with optometrist as planned. Call back if no explanation for visual symptoms from optometry.  Return in about 3 months (around 10/15/2014) for recheck.

## 2014-07-29 ENCOUNTER — Other Ambulatory Visit: Payer: Self-pay | Admitting: *Deleted

## 2014-07-29 MED ORDER — PROPRANOLOL HCL ER 80 MG PO CP24: ORAL_CAPSULE | ORAL | Status: DC

## 2014-07-29 MED ORDER — LEVOTHYROXINE SODIUM 100 MCG PO TABS: 100.0000 ug | ORAL_TABLET | Freq: Every day | ORAL | Status: DC

## 2014-10-21 ENCOUNTER — Encounter: Payer: Self-pay | Admitting: Nurse Practitioner

## 2014-10-21 ENCOUNTER — Ambulatory Visit (INDEPENDENT_AMBULATORY_CARE_PROVIDER_SITE_OTHER): Payer: 59 | Admitting: Nurse Practitioner

## 2014-10-21 VITALS — BP 94/70 | Ht 69.0 in | Wt 244.2 lb

## 2014-10-21 DIAGNOSIS — E890 Postprocedural hypothyroidism: Secondary | ICD-10-CM

## 2014-10-21 MED ORDER — PHENTERMINE HCL 37.5 MG PO TABS: 37.5000 mg | ORAL_TABLET | Freq: Every day | ORAL | Status: DC

## 2014-10-22 ENCOUNTER — Encounter: Payer: Self-pay | Admitting: Nurse Practitioner

## 2014-10-22 LAB — TSH: TSH: 1.95 u[IU]/mL (ref 0.450–4.500)

## 2014-10-24 DIAGNOSIS — E039 Hypothyroidism, unspecified: Secondary | ICD-10-CM | POA: Insufficient documentation

## 2014-10-24 NOTE — Progress Notes (Signed)
Subjective:  Presents for recheck of her thyroid medicine. Doing much better on name brand Synthroid. Staying active. Having difficulty losing weight. Would like to try medication.   Objective:   BP 94/70 mmHg  Ht 5\' 9"  (1.753 m)  Wt 244 lb 4 oz (110.791 kg)  BMI 36.05 kg/m2 NAD. Alert, oriented. Lungs clear. Heart RRR.   Assessment:  Problem List Items Addressed This Visit      Endocrine   Hypothyroidism - Primary     Other   Morbid obesity   Relevant Medications   phentermine (ADIPEX-P) 37.5 MG tablet     Plan:  Meds ordered this encounter  Medications  . LEVORA 0.15/30, 28, 0.15-30 MG-MCG tablet    Sig: TK 1 T PO D    Refill:  0  . phentermine (ADIPEX-P) 37.5 MG tablet    Sig: Take 1 tablet (37.5 mg total) by mouth daily before breakfast.    Dispense:  30 tablet    Refill:  0    Order Specific Question:  Supervising Provider    Answer:  Maggie Font   Advised not to take phentermine at the same time as thyroid medicine. Recommend healthy diet and regular activity.  Return in about 1 month (around 11/21/2014) for recheck. DC med and call if any problems.

## 2014-10-26 ENCOUNTER — Other Ambulatory Visit: Payer: Self-pay | Admitting: Obstetrics and Gynecology

## 2014-10-27 LAB — CYTOLOGY - PAP

## 2014-11-01 ENCOUNTER — Other Ambulatory Visit: Payer: Self-pay | Admitting: *Deleted

## 2014-11-01 MED ORDER — LEVOTHYROXINE SODIUM 100 MCG PO TABS: 100.0000 ug | ORAL_TABLET | Freq: Every day | ORAL | Status: DC

## 2014-11-09 ENCOUNTER — Other Ambulatory Visit: Payer: Self-pay | Admitting: *Deleted

## 2014-11-09 MED ORDER — LEVOTHYROXINE SODIUM 100 MCG PO TABS: 100.0000 ug | ORAL_TABLET | Freq: Every day | ORAL | Status: DC

## 2014-11-22 ENCOUNTER — Encounter: Payer: Self-pay | Admitting: Nurse Practitioner

## 2014-11-22 ENCOUNTER — Ambulatory Visit (INDEPENDENT_AMBULATORY_CARE_PROVIDER_SITE_OTHER): Payer: 59 | Admitting: Nurse Practitioner

## 2014-11-22 VITALS — BP 110/82 | Ht 69.0 in | Wt 232.0 lb

## 2014-11-22 DIAGNOSIS — R1011 Right upper quadrant pain: Secondary | ICD-10-CM

## 2014-11-22 MED ORDER — PHENTERMINE HCL 37.5 MG PO TABS: 37.5000 mg | ORAL_TABLET | Freq: Every day | ORAL | Status: DC

## 2014-11-23 ENCOUNTER — Encounter: Payer: Self-pay | Admitting: Nurse Practitioner

## 2014-11-23 LAB — CBC WITH DIFFERENTIAL/PLATELET
BASOS: 0 %
Basophils Absolute: 0 10*3/uL (ref 0.0–0.2)
EOS (ABSOLUTE): 0.1 10*3/uL (ref 0.0–0.4)
Eos: 2 %
HEMATOCRIT: 39.6 % (ref 34.0–46.6)
Hemoglobin: 12.8 g/dL (ref 11.1–15.9)
Immature Grans (Abs): 0 10*3/uL (ref 0.0–0.1)
Immature Granulocytes: 0 %
LYMPHS ABS: 1.6 10*3/uL (ref 0.7–3.1)
LYMPHS: 27 %
MCH: 28.8 pg (ref 26.6–33.0)
MCHC: 32.3 g/dL (ref 31.5–35.7)
MCV: 89 fL (ref 79–97)
MONOCYTES: 8 %
MONOS ABS: 0.5 10*3/uL (ref 0.1–0.9)
NEUTROS ABS: 3.9 10*3/uL (ref 1.4–7.0)
Neutrophils: 63 %
Platelets: 289 10*3/uL (ref 150–379)
RBC: 4.45 x10E6/uL (ref 3.77–5.28)
RDW: 13.6 % (ref 12.3–15.4)
WBC: 6.1 10*3/uL (ref 3.4–10.8)

## 2014-11-23 LAB — LIPASE: Lipase: 30 U/L (ref 0–59)

## 2014-11-23 LAB — HEPATIC FUNCTION PANEL
ALT: 22 IU/L (ref 0–32)
AST: 20 IU/L (ref 0–40)
Albumin: 4.3 g/dL (ref 3.5–5.5)
Alkaline Phosphatase: 53 IU/L (ref 39–117)
BILIRUBIN TOTAL: 0.4 mg/dL (ref 0.0–1.2)
BILIRUBIN, DIRECT: 0.11 mg/dL (ref 0.00–0.40)
Total Protein: 6.6 g/dL (ref 6.0–8.5)

## 2014-11-23 NOTE — Progress Notes (Signed)
Subjective:  Presents for recheck. Has been having some right upper quadrant abdominal pain off-and-on for the past month. Describes as a stabbing pain that can last anywhere from 10 minutes up to 3 hours. Occurs at least every other day. No acid reflux or heartburn. No fever. No nausea vomiting. No constipation or diarrhea. No urinary symptoms. Seems to be slightly worse with tomato products. Recently bought a bicycle, has increased her activity. Doing well with her diet. Stop the phentermine for several days to make sure this was not causing her abdominal pain but it persisted. Has since started back on phentermine. Denies any adverse effects.  Objective:   BP 110/82 mmHg  Ht 5\' 9"  (1.753 m)  Wt 232 lb (105.235 kg)  BMI 34.24 kg/m2 NAD. Alert, oriented. Lungs clear. Heart regular rate rhythm. Abdomen soft nondistended with active bowel sounds 4. Tenderness in the right mid outer upper quadrant. No obvious masses. No rebound or guarding.  Assessment:  Problem List Items Addressed This Visit      Other   Morbid obesity   Relevant Medications   phentermine (ADIPEX-P) 37.5 MG tablet    Other Visit Diagnoses    Abdominal pain, right upper quadrant    -  Primary    Relevant Orders    CBC with Differential/Platelet (Completed)    Hepatic function panel (Completed)    Lipase (Completed)    US Abdomen Limited RUQ      Plan:  Meds ordered this encounter  Medications  . phentermine (ADIPEX-P) 37.5 MG tablet    Sig: Take 1 tablet (37.5 mg total) by mouth daily before breakfast.    Dispense:  30 tablet    Refill:  2    Order Specific Question:  Supervising Provider    Answer:  Mikey Kirschner [[id number]]  . Norethindrone Acetate-Ethinyl Estrad-FE (LOESTRIN 24 FE) 1-20 MG-MCG(24) tablet    Sig: TK 1 T PO D    Refill:  11   Continue phentermine as directed. Continue healthy diet and activity. Warning signs reviewed regarding abdominal pain. Further follow-up based on test results, patient to  call or go to ED sooner if symptoms worsen. Return in about 3 months (around 02/22/2015) for recheck.

## 2014-11-24 ENCOUNTER — Ambulatory Visit (HOSPITAL_COMMUNITY)
Admission: RE | Admit: 2014-11-24 | Discharge: 2014-11-24 | Disposition: A | Discharge status: Home / Self Care | Payer: 59 | Source: Ambulatory Visit | Attending: Nurse Practitioner | Admitting: Nurse Practitioner

## 2014-11-24 DIAGNOSIS — R1011 Right upper quadrant pain: Secondary | ICD-10-CM | POA: Insufficient documentation

## 2014-12-07 ENCOUNTER — Other Ambulatory Visit: Payer: Self-pay

## 2014-12-07 ENCOUNTER — Telehealth: Payer: Self-pay | Admitting: Nurse Practitioner

## 2014-12-07 DIAGNOSIS — R1011 Right upper quadrant pain: Secondary | ICD-10-CM

## 2014-12-07 NOTE — Telephone Encounter (Signed)
Noted. Thanks.

## 2014-12-07 NOTE — Telephone Encounter (Signed)
Patient HIDA scan scheduled on Friday August 26th, @ 7:45 am as ordered by Pearson Forster, NP. Informed patient not to eat or drink before procedure.

## 2014-12-10 ENCOUNTER — Encounter (HOSPITAL_COMMUNITY): Payer: 59

## 2014-12-25 ENCOUNTER — Other Ambulatory Visit: Payer: Self-pay | Admitting: Family Medicine

## 2015-02-23 ENCOUNTER — Ambulatory Visit: Payer: 59 | Admitting: Nurse Practitioner

## 2015-03-28 ENCOUNTER — Telehealth: Payer: Self-pay | Admitting: Family Medicine

## 2015-03-28 MED ORDER — LEVOTHYROXINE SODIUM 100 MCG PO TABS: 100.0000 ug | ORAL_TABLET | Freq: Every day | ORAL | Status: DC

## 2015-03-28 NOTE — Telephone Encounter (Signed)
Patient may have generic levothyroxine. More than likely this was a electronic record related error-by later spring time needs to repeat lab work. 90 days with one refill permissible

## 2015-03-28 NOTE — Telephone Encounter (Signed)
[facility] ( need to verify which [facility] patient would like levothyroxine to go to)

## 2015-03-28 NOTE — Telephone Encounter (Signed)
May we just send in generic Synthroid? Saw on the last script that we filled that it said name brand only did not know if it was for a particular reason from [place] that we filled for only name brand. Please advise?

## 2015-03-28 NOTE — Telephone Encounter (Signed)
Pt is needing a generic prescription sent in for the levothyroxine. Pt originally had synthroid called in and is unable to continue to pay the price that it costs.   [facility]

## 2015-03-30 MED ORDER — LEVOTHYROXINE SODIUM 100 MCG PO TABS
100.0000 ug | ORAL_TABLET | Freq: Every day | ORAL | Status: DC
Start: 2015-03-30 — End: 2015-07-01

## 2015-03-30 NOTE — Telephone Encounter (Signed)
Medication sent into pharmacy. Patient notified.  

## 2015-04-09 ENCOUNTER — Other Ambulatory Visit: Payer: Self-pay | Admitting: Family Medicine

## 2015-05-31 ENCOUNTER — Other Ambulatory Visit: Payer: Self-pay | Admitting: Nurse Practitioner

## 2015-07-01 ENCOUNTER — Ambulatory Visit (INDEPENDENT_AMBULATORY_CARE_PROVIDER_SITE_OTHER): Payer: 59 | Admitting: Nurse Practitioner

## 2015-07-01 ENCOUNTER — Encounter: Payer: Self-pay | Admitting: Nurse Practitioner

## 2015-07-01 VITALS — BP 120/86 | Temp 98.9°F | Ht 69.0 in | Wt 238.5 lb

## 2015-07-01 DIAGNOSIS — K219 Gastro-esophageal reflux disease without esophagitis: Secondary | ICD-10-CM

## 2015-07-01 DIAGNOSIS — R1011 Right upper quadrant pain: Secondary | ICD-10-CM

## 2015-07-01 MED ORDER — PANTOPRAZOLE SODIUM 40 MG PO TBEC: 40.0000 mg | DELAYED_RELEASE_TABLET | Freq: Every day | ORAL | Status: DC

## 2015-07-01 MED ORDER — PROPRANOLOL HCL ER 80 MG PO CP24: ORAL_CAPSULE | ORAL | Status: DC

## 2015-07-01 MED ORDER — LEVOTHYROXINE SODIUM 100 MCG PO TABS: 100.0000 ug | ORAL_TABLET | Freq: Every day | ORAL | Status: DC

## 2015-07-01 NOTE — Patient Instructions (Signed)
Gastroesophageal Reflux Disease, Adult Normally, food travels down the esophagus and stays in the stomach to be digested. However, when a person has gastroesophageal reflux disease (GERD), food and stomach acid move back up into the esophagus. When this happens, the esophagus becomes sore and inflamed. Over time, GERD can create small holes (ulcers) in the lining of the esophagus.  CAUSES This condition is caused by a problem with the muscle between the esophagus and the stomach (lower esophageal sphincter, or LES). Normally, the LES muscle closes after food passes through the esophagus to the stomach. When the LES is weakened or abnormal, it does not close properly, and that allows food and stomach acid to go back up into the esophagus. The LES can be weakened by certain dietary substances, medicines, and medical conditions, including:  Tobacco use.  Pregnancy.  Having a hiatal hernia.  Heavy alcohol use.  Certain foods and beverages, such as coffee, chocolate, onions, and peppermint. RISK FACTORS This condition is more likely to develop in:  People who have an increased body weight.  People who have connective tissue disorders.  People who use NSAID medicines. SYMPTOMS Symptoms of this condition include:  Heartburn.  Difficult or painful swallowing.  The feeling of having a lump in the throat.  Abitter taste in the mouth.  Bad breath.  Having a large amount of saliva.  Having an upset or bloated stomach.  Belching.  Chest pain.  Shortness of breath or wheezing.  Ongoing (chronic) cough or a night-time cough.  Wearing away of tooth enamel.  Weight loss. Different conditions can cause chest pain. Make sure to see your health care provider if you experience chest pain. DIAGNOSIS Your health care provider will take a medical history and perform a physical exam. To determine if you have mild or severe GERD, your health care provider may also monitor how you respond  to treatment. You may also have other tests, including:  An endoscopy toexamine your stomach and esophagus with a small camera.  A test thatmeasures the acidity level in your esophagus.  A test thatmeasures how much pressure is on your esophagus.  A barium swallow or modified barium swallow to show the shape, size, and functioning of your esophagus. TREATMENT The goal of treatment is to help relieve your symptoms and to prevent complications. Treatment for this condition may vary depending on how severe your symptoms are. Your health care provider may recommend:  Changes to your diet.  Medicine.  Surgery. HOME CARE INSTRUCTIONS Diet  Follow a diet as recommended by your health care provider. This may involve avoiding foods and drinks such as:  Coffee and tea (with or without caffeine).  Drinks that containalcohol.  Energy drinks and sports drinks.  Carbonated drinks or sodas.  Chocolate and cocoa.  Peppermint and mint flavorings.  Garlic and onions.  Horseradish.  Spicy and acidic foods, including peppers, chili powder, curry powder, vinegar, hot sauces, and barbecue sauce.  Citrus fruit juices and citrus fruits, such as oranges, lemons, and limes.  Tomato-based foods, such as red sauce, chili, salsa, and pizza with red sauce.  Fried and fatty foods, such as donuts, french fries, potato chips, and high-fat dressings.  High-fat meats, such as hot dogs and fatty cuts of red and white meats, such as rib eye steak, sausage, ham, and bacon.  High-fat dairy items, such as whole milk, butter, and cream cheese.  Eat small, frequent meals instead of large meals.  Avoid drinking large amounts of liquid with your   meals.  Avoid eating meals during the 2-3 hours before bedtime.  Avoid lying down right after you eat.  Do not exercise right after you eat. General Instructions  Pay attention to any changes in your symptoms.  Take over-the-counter and prescription  medicines only as told by your health care provider. Do not take aspirin, ibuprofen, or other NSAIDs unless your health care provider told you to do so.  Do not use any tobacco products, including cigarettes, chewing tobacco, and e-cigarettes. If you need help quitting, ask your health care provider.  Wear loose-fitting clothing. Do not wear anything tight around your waist that causes pressure on your abdomen.  Raise (elevate) the head of your bed 6 inches (15cm).  Try to reduce your stress, such as with yoga or meditation. If you need help reducing stress, ask your health care provider.  If you are overweight, reduce your weight to an amount that is healthy for you. Ask your health care provider for guidance about a safe weight loss goal.  Keep all follow-up visits as told by your health care provider. This is important. SEEK MEDICAL CARE IF:  You have new symptoms.  You have unexplained weight loss.  You have difficulty swallowing, or it hurts to swallow.  You have wheezing or a persistent cough.  Your symptoms do not improve with treatment.  You have a hoarse voice. SEEK IMMEDIATE MEDICAL CARE IF:  You have pain in your arms, neck, jaw, teeth, or back.  You feel sweaty, dizzy, or light-headed.  You have chest pain or shortness of breath.  You vomit and your vomit looks like blood or coffee grounds.  You faint.  Your stool is bloody or black.  You cannot swallow, drink, or eat.   This information is not intended to replace advice given to you by your health care provider. Make sure you discuss any questions you have with your health care provider.   Document Released: 01/10/2005 Document Revised: 12/22/2014 Document Reviewed: 07/28/2014 Elsevier Interactive Patient Education 2016 Elsevier Inc[employer] Food Choices for Gastroesophageal Reflux Disease, Adult When you have gastroesophageal reflux disease (GERD), the foods you eat and your eating habits are very important.  Choosing the right foods can help ease the discomfort of GERD. WHAT GENERAL GUIDELINES DO I NEED TO FOLLOW?  Choose fruits, vegetables, whole grains, low-fat dairy products, and low-fat meat, fish, and poultry.  Limit fats such as oils, salad dressings, butter, nuts, and avocado.  Keep a food diary to identify foods that cause symptoms.  Avoid foods that cause reflux. These may be different for different people.  Eat frequent small meals instead of three large meals each day.  Eat your meals slowly, in a relaxed setting.  Limit fried foods.  Cook foods using methods other than frying.  Avoid drinking alcohol.  Avoid drinking large amounts of liquids with your meals.  Avoid bending over or lying down until 2-3 hours after eating. WHAT FOODS ARE NOT RECOMMENDED? The following are some foods and drinks that may worsen your symptoms: Vegetables Tomatoes. Tomato juice. Tomato and spaghetti sauce. Chili peppers. Onion and garlic. Horseradish. Fruits Oranges, grapefruit, and lemon (fruit and juice). Meats High-fat meats, fish, and poultry. This includes hot dogs, ribs, ham, sausage, salami, and bacon. Dairy Whole milk and chocolate milk. Sour cream. Cream. Butter. Ice cream. Cream cheese.  Beverages Coffee and tea, with or without caffeine. Carbonated beverages or energy drinks. Condiments Hot sauce. Barbecue sauce.  Sweets/Desserts Chocolate and cocoa. Donuts. Peppermint and spearmint.   Fats and Oils High-fat foods, including French fries and potato chips. Other Vinegar. Strong spices, such as black pepper, white pepper, red pepper, cayenne, curry powder, cloves, ginger, and chili powder. The items listed above may not be a complete list of foods and beverages to avoid. Contact your dietitian for more information.   This information is not intended to replace advice given to you by your health care provider. Make sure you discuss any questions you have with your health care  provider.   Document Released: 04/02/2005 Document Revised: 04/23/2014 Document Reviewed: 02/04/2013 Elsevier Interactive Patient Education 2016 Elsevier Inc.  

## 2015-07-02 ENCOUNTER — Encounter: Payer: Self-pay | Admitting: Nurse Practitioner

## 2015-07-02 DIAGNOSIS — K219 Gastro-esophageal reflux disease without esophagitis: Secondary | ICD-10-CM | POA: Insufficient documentation

## 2015-07-02 NOTE — Progress Notes (Signed)
Subjective:  Presents for recheck of her right upper quadrant abdominal pain. Had a normal abdominal ultrasound August 2016, did not get HIDA scan since her symptoms improved. Has occurred off and on. Gradually worse over the past 2 weeks. Complaints of right upper quadrant/mid abdominal pain radiating into the back area. Frequent acid reflux. Nausea/vomiting. A feeling of fullness bloating and firmness in the upper abdominal area. Increased satiety. Worse with intake of chocolate and greasy foods. Stopped caffeine intake about 3 days ago, has had some breakthrough caffeine withdrawal headaches. Has increased her doses of ibuprofen and Excedrin over the past few weeks. Nonsmoker. No alcohol use. Does drink orange juice frequently. Has chronic constipation, no change in her bowel movements or color. Had some fevers last week which she relates to her illness, this has resolved.  Objective:   BP 120/86 mmHg  Temp(Src) 98.9 F (37.2 C) (Oral)  Ht 5\' 9"  (1.753 m)  Wt 238 lb 8 oz (108.183 kg)  BMI 35.20 kg/m2 NAD. Alert, oriented. Lungs clear. Heart regular rate rhythm. Abdomen mildly obese soft nondistended with active bowel sounds 4. Distinct epigastric area tenderness going into the right upper quadrant. No obvious organomegaly.  Assessment:  Problem List Items Addressed This Visit      Digestive   Gastroesophageal reflux disease without esophagitis   Relevant Medications   pantoprazole (PROTONIX) 40 MG tablet    Other Visit Diagnoses    Right upper quadrant pain    -  Primary    Relevant Orders    NM Hepato W/Eject Fract      Plan:  Meds ordered this encounter  Medications  . levothyroxine (SYNTHROID) 100 MCG tablet    Sig: Take 1 tablet (100 mcg total) by mouth daily before breakfast.    Dispense:  90 tablet    Refill:  0    May dispense Generic    Order Specific Question:  Supervising Provider    Answer:  Mikey Kirschner [[id number]]  . propranolol ER (INDERAL LA) 80 MG 24 hr capsule     Sig: Take 1 capsule by mouth  every day    Dispense:  90 capsule    Refill:  0    Order Specific Question:  Supervising Provider    Answer:  Mikey Kirschner [[id number]]  . pantoprazole (PROTONIX) 40 MG tablet    Sig: Take 1 tablet (40 mg total) by mouth daily.    Dispense:  30 tablet    Refill:  2    Order Specific Question:  Supervising Provider    Answer:  Mikey Kirschner [[id number]]   Schedule HIDA scan. Warning signs reviewed. Patient to call or go to ED sooner if any problems. Start Protonix daily. Continue to hold on caffeine, wean off all insect. Avoid citrus and chocolate. Given written information on GERD and diet. Further follow-up based on scan results. Also patient given refill on levothyroxine and propranolol.

## 2015-07-07 ENCOUNTER — Encounter (HOSPITAL_COMMUNITY)
Admission: RE | Admit: 2015-07-07 | Discharge: 2015-07-07 | Disposition: A | Discharge status: Home / Self Care | Payer: 59 | Source: Ambulatory Visit | Attending: Nurse Practitioner | Admitting: Nurse Practitioner

## 2015-07-07 ENCOUNTER — Encounter (HOSPITAL_COMMUNITY): Payer: Self-pay

## 2015-07-07 DIAGNOSIS — R1011 Right upper quadrant pain: Secondary | ICD-10-CM | POA: Insufficient documentation

## 2015-07-07 MED ORDER — STERILE WATER FOR INJECTION IJ SOLN
INTRAMUSCULAR | Status: AC
  Administered 2015-07-07: 5 mL
  Filled 2015-07-07: qty 10

## 2015-07-07 MED ORDER — TECHNETIUM TC 99M MEBROFENIN IV KIT
5.0000 | PACK | Freq: Once | INTRAVENOUS | Status: AC | PRN
  Administered 2015-07-07: 5.4 via INTRAVENOUS

## 2015-07-07 MED ORDER — SINCALIDE 5 MCG IJ SOLR
INTRAMUSCULAR | Status: AC
  Administered 2015-07-07: 2.18 ug
  Filled 2015-07-07: qty 5

## 2015-08-24 ENCOUNTER — Other Ambulatory Visit: Payer: Self-pay | Admitting: Nurse Practitioner

## 2015-10-11 ENCOUNTER — Other Ambulatory Visit: Payer: Self-pay | Admitting: Nurse Practitioner

## 2015-10-20 ENCOUNTER — Other Ambulatory Visit: Payer: Self-pay | Admitting: Family Medicine

## 2015-11-24 ENCOUNTER — Other Ambulatory Visit: Payer: Self-pay | Admitting: Family Medicine

## 2015-12-01 ENCOUNTER — Other Ambulatory Visit: Payer: Self-pay | Admitting: Nurse Practitioner

## 2015-12-01 ENCOUNTER — Telehealth: Payer: Self-pay | Admitting: Nurse Practitioner

## 2015-12-01 MED ORDER — PROPRANOLOL HCL ER 80 MG PO CP24
ORAL_CAPSULE | ORAL | 0 refills | Status: DC
Start: 2015-12-01 — End: 2016-01-20

## 2015-12-01 NOTE — Telephone Encounter (Signed)
Pt called and scheduled a med check for Carolyn's first available appt. Pt will need enough propranolol ER (INDERAL LA) 80 MG 24 hr capsule to get her to that appt.   Festus Barren

## 2015-12-15 ENCOUNTER — Ambulatory Visit (INDEPENDENT_AMBULATORY_CARE_PROVIDER_SITE_OTHER): Payer: 59 | Admitting: Nurse Practitioner

## 2015-12-15 ENCOUNTER — Encounter: Payer: Self-pay | Admitting: Nurse Practitioner

## 2015-12-15 VITALS — BP 114/80 | Ht 69.0 in | Wt 253.4 lb

## 2015-12-15 DIAGNOSIS — H538 Other visual disturbances: Secondary | ICD-10-CM

## 2015-12-15 DIAGNOSIS — R5383 Other fatigue: Secondary | ICD-10-CM

## 2015-12-15 DIAGNOSIS — E890 Postprocedural hypothyroidism: Secondary | ICD-10-CM

## 2015-12-15 LAB — HM DIABETES EYE EXAM

## 2015-12-16 ENCOUNTER — Encounter: Payer: Self-pay | Admitting: Nurse Practitioner

## 2015-12-16 LAB — CBC WITH DIFFERENTIAL/PLATELET
BASOS: 0 %
Basophils Absolute: 0 10*3/uL (ref 0.0–0.2)
EOS (ABSOLUTE): 0.1 10*3/uL (ref 0.0–0.4)
Eos: 2 %
HEMOGLOBIN: 12.8 g/dL (ref 11.1–15.9)
Hematocrit: 39.5 % (ref 34.0–46.6)
IMMATURE GRANS (ABS): 0 10*3/uL (ref 0.0–0.1)
Immature Granulocytes: 0 %
LYMPHS ABS: 1.5 10*3/uL (ref 0.7–3.1)
LYMPHS: 21 %
MCH: 28.7 pg (ref 26.6–33.0)
MCHC: 32.4 g/dL (ref 31.5–35.7)
MCV: 89 fL (ref 79–97)
Monocytes Absolute: 0.7 10*3/uL (ref 0.1–0.9)
Monocytes: 10 %
NEUTROS ABS: 4.8 10*3/uL (ref 1.4–7.0)
Neutrophils: 67 %
PLATELETS: 280 10*3/uL (ref 150–379)
RBC: 4.46 x10E6/uL (ref 3.77–5.28)
RDW: 14 % (ref 12.3–15.4)
WBC: 7.2 10*3/uL (ref 3.4–10.8)

## 2015-12-16 LAB — HEMOGLOBIN A1C
Est. average glucose Bld gHb Est-mCnc: 103 mg/dL
Hgb A1c MFr Bld: 5.2 % (ref 4.8–5.6)

## 2015-12-16 LAB — TSH: TSH: 2.47 u[IU]/mL (ref 0.450–4.500)

## 2015-12-16 NOTE — Progress Notes (Signed)
Subjective:  Presents for follow-up on her thyroid. Is due for routine blood work. Compliant with medication. Has noticed some slight increase in fatigue lately. Difficulty losing weight despite activity and healthy diet. Also had one day of persistent blurred vision where it was difficult to read or look at a computer. This has resolved, no further incidents. Minimal headache. No vomiting. Denies any other neurologic symptoms. Has a history of migraine headaches but has not had any lately.  Objective:   BP 114/80   Ht 5\' 9"  (1.753 m)   Wt 253 lb 6 oz (114.9 kg)   BMI 37.42 kg/m  NAD. Alert, oriented. Cheerful affect. Lungs clear. Heart regular rate rhythm. Thyroid nontender, no mass or goiter noted. Has gained over 20 pounds since March, note the patient could not take phentermine due to side effects.  Assessment:  Problem List Items Addressed This Visit      Endocrine   Hypothyroidism - Primary   Relevant Orders   TSH    Other Visit Diagnoses    Other fatigue       Relevant Orders   CBC with Differential/Platelet   Hemoglobin A1c   Blurred vision       Relevant Orders   Hemoglobin A1c     Plan: Lab work pending. Further follow-up based on results. Running signs reviewed. Call back in the meantime If blurred vision recurs. Recommend optometry exam, patient agrees with this plan.

## 2016-01-20 ENCOUNTER — Other Ambulatory Visit: Payer: Self-pay | Admitting: Nurse Practitioner

## 2016-02-14 ENCOUNTER — Telehealth: Payer: Self-pay | Admitting: Family Medicine

## 2016-02-14 ENCOUNTER — Other Ambulatory Visit: Payer: Self-pay | Admitting: *Deleted

## 2016-02-14 MED ORDER — ONDANSETRON 8 MG PO TBDP: 8.0000 mg | ORAL_TABLET | Freq: Three times a day (TID) | ORAL | 1 refills | Status: DC | PRN

## 2016-02-14 NOTE — Telephone Encounter (Signed)
Discussed with pt. Wants odt. Med sent to pharm.

## 2016-02-14 NOTE — Telephone Encounter (Signed)
Zofran 8 mg-please talk with the patient if she would prefer dissolvable use ODT if not go with the pill, #15, one 3 times a day when necessary, 1 refill

## 2016-02-14 NOTE — Telephone Encounter (Signed)
Patient has a stomach bug and is experiencing some vomiting and nausea since yesterday.  She wants to know if we can send in Rx for Zofran.  [facility]

## 2016-03-05 ENCOUNTER — Ambulatory Visit (INDEPENDENT_AMBULATORY_CARE_PROVIDER_SITE_OTHER): Payer: 59 | Admitting: Nurse Practitioner

## 2016-03-05 ENCOUNTER — Encounter: Payer: Self-pay | Admitting: Nurse Practitioner

## 2016-03-05 VITALS — BP 110/78 | Ht 69.0 in | Wt 232.0 lb

## 2016-03-05 DIAGNOSIS — E890 Postprocedural hypothyroidism: Secondary | ICD-10-CM

## 2016-03-07 ENCOUNTER — Encounter: Payer: Self-pay | Admitting: Nurse Practitioner

## 2016-03-07 ENCOUNTER — Other Ambulatory Visit: Payer: Self-pay | Admitting: Family Medicine

## 2016-03-07 NOTE — Progress Notes (Signed)
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 Gets regular preventive health physicals with gynecology. Migraines have been stable. Compliant with her thyroid medicine. No other concerns today.  Objective:   BP 110/78   Ht 5\' 9"  (1.753 m)   Wt 232 lb (105.2 kg)   BMI 34.26 kg/m  NAD. Alert, oriented. Lungs clear. Heart regular rate rhythm. Last TSH was done 12/15/2015 results 2.4.  Assessment:  Problem List Items Addressed This Visit      Endocrine   Hypothyroidism - Primary     Plan: Continue current medication regimen. Paperwork filled out for social services. Recheck here as needed.

## 2016-06-28 DIAGNOSIS — J0100 Acute maxillary sinusitis, unspecified: Secondary | ICD-10-CM | POA: Diagnosis not present

## 2016-08-09 ENCOUNTER — Other Ambulatory Visit: Payer: Self-pay | Admitting: Nurse Practitioner

## 2016-09-07 ENCOUNTER — Other Ambulatory Visit: Payer: Self-pay | Admitting: Family Medicine

## 2016-09-27 ENCOUNTER — Other Ambulatory Visit: Payer: Self-pay | Admitting: Family Medicine

## 2016-10-01 ENCOUNTER — Inpatient Hospital Stay (HOSPITAL_COMMUNITY)
Admission: AD | Admit: 2016-10-01 | Discharge: 2016-10-01 | Disposition: A | Discharge status: Home / Self Care | Payer: 59 | Source: Ambulatory Visit | Attending: Obstetrics and Gynecology | Admitting: Obstetrics and Gynecology

## 2016-10-01 ENCOUNTER — Encounter (HOSPITAL_COMMUNITY): Payer: Self-pay

## 2016-10-01 DIAGNOSIS — I10 Essential (primary) hypertension: Secondary | ICD-10-CM | POA: Insufficient documentation

## 2016-10-01 DIAGNOSIS — F419 Anxiety disorder, unspecified: Secondary | ICD-10-CM | POA: Insufficient documentation

## 2016-10-01 DIAGNOSIS — Z880 Allergy status to penicillin: Secondary | ICD-10-CM | POA: Diagnosis not present

## 2016-10-01 DIAGNOSIS — Z87891 Personal history of nicotine dependence: Secondary | ICD-10-CM | POA: Insufficient documentation

## 2016-10-01 DIAGNOSIS — K611 Rectal abscess: Secondary | ICD-10-CM | POA: Diagnosis not present

## 2016-10-01 LAB — URINALYSIS, ROUTINE W REFLEX MICROSCOPIC
BILIRUBIN URINE: NEGATIVE
GLUCOSE, UA: NEGATIVE mg/dL
KETONES UR: NEGATIVE mg/dL
Nitrite: NEGATIVE
PH: 5 (ref 5.0–8.0)
PROTEIN: NEGATIVE mg/dL
Specific Gravity, Urine: 1.012 (ref 1.005–1.030)

## 2016-10-01 LAB — POCT PREGNANCY, URINE: Preg Test, Ur: NEGATIVE

## 2016-10-01 MED ORDER — HYDROCODONE-ACETAMINOPHEN 5-325 MG PO TABS: 1.0000 | ORAL_TABLET | ORAL | 0 refills | Status: DC | PRN

## 2016-10-01 MED ORDER — CIPROFLOXACIN HCL 500 MG PO TABS: 500.0000 mg | ORAL_TABLET | Freq: Two times a day (BID) | ORAL | 0 refills | Status: DC

## 2016-10-01 MED ORDER — METRONIDAZOLE 500 MG PO TABS: 500.0000 mg | ORAL_TABLET | Freq: Two times a day (BID) | ORAL | 0 refills | Status: DC

## 2016-10-01 NOTE — MAU Note (Addendum)
Pt states that she believes she has an abscess near her rectum-states it started to come up on Friday, worse on Saturday. States she tried to get an appointment today but was unable to and was told to come in. Rates pain 9/10. Has tried Motrin and warm baths-helps some.

## 2016-10-01 NOTE — MAU Provider Note (Signed)
History     CSN: [id number]  Arrival date and time: 10/01/16 1842   First Provider Initiated Contact with Patient 10/01/16 2014      Chief Complaint  Patient presents with  . Abscess   Lisa [name] is a 36 y.o. [id number] who presents today with possible abscess on the left side near the rectum. She states that it has been there since Friday.    Abscess  This is a new problem. The current episode started in the past 7 days. The problem occurs constantly. The problem has been gradually worsening. Associated symptoms include a fever (99.8 yesterday. Took motrin and no fever since. ). Pertinent negatives include no nausea or vomiting. She has tried heat (warm soaks. ) for the symptoms. The treatment provided no relief.      Past Medical History:  Diagnosis Date  . Anxiety   . Headache(784.0)   . Hypertension   . Neuromuscular disorder ([place])    sus  . PONV (postoperative nausea and vomiting)   . Thyroid nodule     Past Surgical History:  Procedure Laterality Date  . ADENOIDECTOMY    . CESAREAN SECTION    . CESAREAN SECTION    . THYROIDECTOMY  06/18/2011   Procedure: THYROIDECTOMY;  Surgeon: Melida Quitter, MD;  Location: [facility];  Service: ENT;  Laterality: Right;  . TONSILLECTOMY    . TYMPANOPLASTY      No family history on file.  Social History  Substance Use Topics  . Smoking status: Former Research scientist (life sciences)  . Smokeless tobacco: Never Used  . Alcohol use Yes     Comment: 1/month    Allergies:  Allergies  Allergen Reactions  . Darvocet [Propoxyphene N-Acetaminophen] Other (See Comments)    Hallucination  . Keflex [Cephalexin]   . Sulfa Antibiotics     Increased BP  . Cephalexin Rash  . Imitrex [Sumatriptan Base] Other (See Comments)    States "makes my jaw lock up"  . Penicillins Rash    Prescriptions Prior to Admission  Medication Sig Dispense Refill Last Dose  . levothyroxine (SYNTHROID, LEVOTHROID) 100 MCG tablet Take 1 tablet by mouth  daily before  breakfast 30 tablet 0 Taking  . levothyroxine (SYNTHROID, LEVOTHROID) 100 MCG tablet TAKE 1 TABLET BY MOUTH DAILY BEFORE BREAKFAST 30 tablet 0   . ondansetron (ZOFRAN ODT) 8 MG disintegrating tablet Take 1 tablet (8 mg total) by mouth every 8 (eight) hours as needed for nausea or vomiting. 15 tablet 1 Taking  . propranolol ER (INDERAL LA) 80 MG 24 hr capsule TAKE 1 CAPSULE BY MOUTH EVERY DAY 30 capsule 0     Review of Systems  Constitutional: Positive for fever (99.8 yesterday. Took motrin and no fever since. ).  Gastrointestinal: Negative for nausea and vomiting.  Genitourinary: Negative for dysuria, hematuria, urgency, vaginal bleeding and vaginal discharge.   Physical Exam   Blood pressure 122/86, pulse 74, temperature 98.7 F (37.1 C), temperature source Oral, resp. rate 18, height 5\' 9"  (1.753 m), weight 238 lb (108 kg), last menstrual period 08/22/2016.  Physical Exam  Nursing note and vitals reviewed. Constitutional: She is oriented to person, place, and time. She appears well-developed and well-nourished. No distress.  HENT:  Head: Normocephalic.  Cardiovascular: Normal rate.   Respiratory: Effort normal.  GI: Soft. There is no tenderness.  Genitourinary:  Genitourinary Comments: Perirectal abscess, tender. No cellulitis.   Neurological: She is alert and oriented to person, place, and time.  Skin: Skin is warm and dry.  Psychiatric: She has a normal mood and affect.   Results for orders placed or performed during the hospital encounter of 10/01/16 (from the past 24 hour(s))  Urinalysis, Routine w reflex microscopic     Status: Abnormal   Collection Time: 10/01/16  7:16 PM  Result Value Ref Range   Color, Urine YELLOW YELLOW   APPearance HAZY (A) CLEAR   Specific Gravity, Urine 1.012 1.005 - 1.030   pH 5.0 5.0 - 8.0   Glucose, UA NEGATIVE NEGATIVE mg/dL   Hgb urine dipstick MODERATE (A) NEGATIVE   Bilirubin Urine NEGATIVE NEGATIVE   Ketones, ur NEGATIVE NEGATIVE mg/dL    Protein, ur NEGATIVE NEGATIVE mg/dL   Nitrite NEGATIVE NEGATIVE   Leukocytes, UA MODERATE (A) NEGATIVE   RBC / HPF 0-5 0 - 5 RBC/hpf   WBC, UA 6-30 0 - 5 WBC/hpf   Bacteria, UA FEW (A) NONE SEEN   Squamous Epithelial / LPF 6-30 (A) NONE SEEN   Mucous PRESENT   Pregnancy, urine POC     Status: None   Collection Time: 10/01/16  7:35 PM  Result Value Ref Range   Preg Test, Ur NEGATIVE NEGATIVE    MAU Course  Procedures  MDM D/W the patient that due to location general surgery needs to evaluate the abscess.  D/W Dr. Excell Seltzer at [facility] for patient to be referred for outpatient follow up. They will call her with an appointment. He would like her started on Augmentin until she can be seen.    Assessment and Plan   1. Perirectal abscess    DC home Comfort measures reviewed  RX: vicodin prn, cipro BID and flagyl BID   Return to MAU as needed   Follow-up Information    Surgery, [place] Follow up.   Specialty:  General Surgery Contact information: [place] [address] [phone]            Marcille Buffy 10/01/2016, 8:17 PM

## 2016-10-01 NOTE — Discharge Instructions (Signed)
Perirectal Abscess An abscess is an infected area that contains a collection of pus. A perirectal abscess is an abscess that is near the opening of the anus or around the rectum. A perirectal abscess can cause a lot of pain, especially during bowel movements. What are the causes? This condition is almost always caused by an infection that starts in an anal gland. What increases the risk? This condition is more likely to develop in:  People with diabetes or inflammatory bowel disease.  People whose body defense system (immune system) is weak.  People who have anal sex.  People who have a sexually transmitted disease (STD).  People who have certain kinds of cancers, such as rectal carcinoma, leukemia, or lymphoma.  What are the signs or symptoms? The main symptom of this condition is pain. The pain may be a throbbing pain that gets worse during bowel movements. Other symptoms include:  Fever.  Swelling.  Redness.  Bleeding.  Constipation.  How is this diagnosed? The condition is diagnosed with a physical exam. If the abscess is not visible, a health care provider may need to place a finger inside the rectum to find the abscess. Sometimes, imaging tests are done to determine the size and location of the abscess. These tests may include:  An ultrasound.  An MRI.  A CT scan.  How is this treated? This condition is usually treated with incision and drainage surgery. Incision and drainage surgery involves making an incision over the abscess to drain the pus. Treatment may also involve antibiotic medicine, pain medicine, stool softeners, or laxatives. Follow these instructions at home:  Take medicines only as directed by your health care provider.  If you were prescribed an antibiotic, finish all of it even if you start to feel better.  To relieve pain, try sitting: ? In a warm, shallow bath (sitz bath). ? On a heating pad with the setting on low. ? On an inflatable  donut-shaped cushion.  Follow any diet instructions as directed by your health care provider.  Keep all follow-up visits as directed by your health care provider. This is important. Contact a health care provider if:  Your abscess is bleeding.  You have pain, swelling, or redness that is getting worse.  You are constipated.  You feel ill.  You have muscle aches or chills.  You have a fever.  Your symptoms return after the abscess has healed. This information is not intended to replace advice given to you by your health care provider. Make sure you discuss any questions you have with your health care provider. Document Released: 03/30/2000 Document Revised: 09/08/2015 Document Reviewed: 02/10/2014 Elsevier Interactive Patient Education  2018 Elsevier Inc.  

## 2016-10-02 ENCOUNTER — Encounter (HOSPITAL_COMMUNITY): Payer: Self-pay | Admitting: Emergency Medicine

## 2016-10-02 ENCOUNTER — Emergency Department (HOSPITAL_COMMUNITY)
Admission: EM | Admit: 2016-10-02 | Discharge: 2016-10-02 | Disposition: A | Discharge status: Home / Self Care | Payer: 59 | Attending: Emergency Medicine | Admitting: Emergency Medicine

## 2016-10-02 DIAGNOSIS — Z87891 Personal history of nicotine dependence: Secondary | ICD-10-CM | POA: Insufficient documentation

## 2016-10-02 DIAGNOSIS — Z79899 Other long term (current) drug therapy: Secondary | ICD-10-CM | POA: Diagnosis not present

## 2016-10-02 DIAGNOSIS — L0291 Cutaneous abscess, unspecified: Secondary | ICD-10-CM

## 2016-10-02 DIAGNOSIS — K611 Rectal abscess: Secondary | ICD-10-CM | POA: Diagnosis not present

## 2016-10-02 DIAGNOSIS — Z880 Allergy status to penicillin: Secondary | ICD-10-CM | POA: Insufficient documentation

## 2016-10-02 DIAGNOSIS — I10 Essential (primary) hypertension: Secondary | ICD-10-CM | POA: Insufficient documentation

## 2016-10-02 DIAGNOSIS — L02818 Cutaneous abscess of other sites: Secondary | ICD-10-CM | POA: Diagnosis not present

## 2016-10-02 MED ORDER — LIDOCAINE-EPINEPHRINE (PF) 2 %-1:200000 IJ SOLN
10.0000 mL | Freq: Once | INTRAMUSCULAR | Status: AC
Start: 2016-10-02 — End: 2016-10-02
  Administered 2016-10-02: 10 mL
  Filled 2016-10-02: qty 20

## 2016-10-02 MED ORDER — OXYCODONE-ACETAMINOPHEN 5-325 MG PO TABS
1.0000 | ORAL_TABLET | Freq: Once | ORAL | Status: AC
  Administered 2016-10-02: 1 via ORAL
  Filled 2016-10-02: qty 1

## 2016-10-02 NOTE — ED Provider Notes (Signed)
[facility] DEPT Provider Note   CSN: [id number] Arrival date & time: 10/02/16  1056     History   Chief Complaint Chief Complaint  Patient presents with  . perirectal abscess    HPI Lisa [name] is a 36 y.o. female.  HPI   Patient is a 36 year old female with history of hypertension who presents the ED with complaint of perirectal abscess. Patient reports over the past 5 days she has had worsening pain and swelling to the area above her rectum. She notes the pain worsened resulting in her going to [facility] yesterday. She states she was diagnosed with a perirectal abscess, started on Cipro and Flagyl and discharged home with outpatient general surgery follow-up. Patient states when she called the general surgery clinic today she was advised to come to the ED to get evaluated by [facility] due to them not having any openings. Patient states her pain and swelling has continued to worsen. She endorses having a fever 2 days ago which has since resolved. Patient states she has been taking Percocet, Tylenol and ibuprofen without relief. Denies abdominal pain, nausea, vomiting, diarrhea, constipation, rectal bleeding, drainage. Patient reports last bowel movement was yesterday. Endorses abdominal surgical history of 3 C-sections. Denies hx of abscesses.  Past Medical History:  Diagnosis Date  . Anxiety   . Headache(784.0)   . Hypertension   . Neuromuscular disorder ([place])    sus  . PONV (postoperative nausea and vomiting)   . Thyroid nodule     Patient Active Problem List   Diagnosis Date Noted  . Gastroesophageal reflux disease without esophagitis 07/02/2015  . Hypothyroidism 10/24/2014  . Morbid obesity ([place]) 10/21/2014  . Thyroid activity decreased 07/16/2014  . Migraine without aura and without status migrainosus, not intractable 07/16/2014  . Anxiety 10/01/2012  . Thyroid nodule 06/18/2011    Past Surgical History:  Procedure Laterality Date  . ADENOIDECTOMY      . CESAREAN SECTION    . CESAREAN SECTION    . THYROIDECTOMY  06/18/2011   Procedure: THYROIDECTOMY;  Surgeon: Melida Quitter, MD;  Location: [facility];  Service: ENT;  Laterality: Right;  . TONSILLECTOMY    . TYMPANOPLASTY      OB History    Gravida Para Term Preterm AB Living   2 2 2     2    SAB TAB Ectopic Multiple Live Births           2       Home Medications    Prior to Admission medications   Medication Sig Start Date End Date Taking? Authorizing Provider  ciprofloxacin (CIPRO) 500 MG tablet Take 1 tablet (500 mg total) by mouth 2 (two) times daily. 10/01/16  Yes Marcille Buffy D, CNM  HYDROcodone-acetaminophen (NORCO/VICODIN) 5-325 MG tablet Take 1-2 tablets by mouth every 4 (four) hours as needed. Patient taking differently: Take 1-2 tablets by mouth every 4 (four) hours as needed for moderate pain.  10/01/16  Yes Marcille Buffy D, CNM  levothyroxine (SYNTHROID, LEVOTHROID) 100 MCG tablet Take 1 tablet by mouth  daily before breakfast 10/20/15  Yes Luking, Elayne Snare, MD  metroNIDAZOLE (FLAGYL) 500 MG tablet Take 1 tablet (500 mg total) by mouth 2 (two) times daily. 10/01/16  Yes Marcille Buffy D, CNM  propranolol ER (INDERAL LA) 80 MG 24 hr capsule TAKE 1 CAPSULE BY MOUTH EVERY DAY 09/11/16  Yes Mikey Kirschner, MD    Family History No family history on file.  Social History Social History  Substance Use Topics  . Smoking status: Former Research scientist (life sciences)  . Smokeless tobacco: Never Used  . Alcohol use Yes     Comment: 1/month     Allergies   Sulfa antibiotics; Darvocet [propoxyphene n-acetaminophen]; Cephalexin; Imitrex [sumatriptan base]; and Penicillins   Review of Systems Review of Systems  Skin:       Perirectal abscess  All other systems reviewed and are negative.    Physical Exam Updated Vital Signs BP 104/68 (BP Location: Left Arm)   Pulse 77   Temp 98.2 F (36.8 C) (Oral)   Resp 18   Ht 5\' 9"  (1.753 m)   Wt 108 kg (238 lb)   LMP 08/22/2016   SpO2 99%   BMI  35.15 kg/m   Physical Exam  Constitutional: She is oriented to person, place, and time. She appears well-developed and well-nourished.  HENT:  Head: Normocephalic and atraumatic.  Eyes: Conjunctivae and EOM are normal. Right eye exhibits no discharge. Left eye exhibits no discharge. No scleral icterus.  Neck: Normal range of motion. Neck supple.  Cardiovascular: Normal rate, regular rhythm, normal heart sounds and intact distal pulses.   Pulmonary/Chest: Effort normal and breath sounds normal. No respiratory distress. She has no wheezes. She has no rales. She exhibits no tenderness.  Abdominal: Soft. Bowel sounds are normal. She exhibits no distension and no mass. There is no tenderness. There is no rebound and no guarding.  Genitourinary: Rectum normal. Rectal exam shows no external hemorrhoid, no internal hemorrhoid, no fissure, no mass, no tenderness and anal tone normal.  Genitourinary Comments: No swelling, tenderness, fluctuance, or drainage noted on DRE.  Musculoskeletal: Normal range of motion. She exhibits no edema.  Neurological: She is alert and oriented to person, place, and time.  Skin: Skin is warm and dry.  4x2cm area of swelling, mild overlying erythema and induration present to superior perirectal region, TTP. No surrounding swelling, erythema, warmth or drainage present.   Nursing note and vitals reviewed.    ED Treatments / Results  Labs (all labs ordered are listed, but only abnormal results are displayed) Labs Reviewed - No data to display  EKG  EKG Interpretation None       Radiology No results found.  Procedures .Marland KitchenIncision and Drainage Date/Time: 10/02/2016 1:45 PM Performed by: Nona Dell Authorized by: Nona Dell   Consent:    Consent obtained:  Verbal   Consent given by:  Patient Location:    Type:  Abscess   Size:  4x2cm   Location:  Anogenital   Anogenital location:  Perirectal Pre-procedure details:    Skin  preparation:  Betadine Anesthesia (see MAR for exact dosages):    Anesthesia method:  Local infiltration   Local anesthetic:  Lidocaine 2% WITH epi Procedure type:    Complexity:  Simple Procedure details:    Incision types:  Single straight   Incision depth:  Dermal   Scalpel blade:  11   Wound management:  Probed and deloculated and irrigated with saline   Drainage:  Bloody and purulent   Drainage amount:  Moderate   Wound treatment:  Wound left open   Packing materials:  None Post-procedure details:    Patient tolerance of procedure:  Tolerated well, no immediate complications   (including critical care time)  Medications Ordered in ED Medications  oxyCODONE-acetaminophen (PERCOCET/ROXICET) 5-325 MG per tablet 1 tablet (1 tablet Oral Given 10/02/16 1252)  lidocaine-EPINEPHrine (XYLOCAINE W/EPI) 2 %-1:200000 (PF) injection 10 mL (10 mLs Infiltration Given 10/02/16  1252)     Initial Impression / Assessment and Plan / ED Course  I have reviewed the triage vital signs and the nursing notes.  Pertinent labs & imaging results that were available during my care of the patient were reviewed by me and considered in my medical decision making (see chart for details).     Patient presents with reported perirectal abscess that was diagnosed yesterday when she was seen at [facility]. She states she was started on antibiotics and discharged home with pain medicine yesterday. Patient notes her symptoms started Friday and have continued to worsen. Denies fever today. VSS. Exam revealed 4x2cm area of swelling, overlying erythema, fluctuance and tenderness present to superior rectum. Digital rectal exam unremarkable. Exam appears consistent with superficial abscess. Presentation not concerning for deep anorectal abscess at this time. I do not feel that general surgery needs to be consulted at this time. I&D performed in the ED without any complications. Plan to discharge patient home with  wound care and advised to continue taking her antibiotics as prescribed until completed. Advised patient to see her PCP in 2 days for wound recheck. Discussed strict return precautions.  Final Clinical Impressions(s) / ED Diagnoses   Final diagnoses:  Abscess    New Prescriptions New Prescriptions   No medications on file     Carmen, [place] 10/02/16 1346    Carmin Muskrat, MD 10/02/16 [phone]

## 2016-10-02 NOTE — Discharge Instructions (Signed)
Continue taking her antibiotics as prescribed until completed. You may also take your prescription of pain meds as needed for pain relief. Keep you wound clean using antibacterial soap and water, pat dry. You may also do warm soaks at home for 15-20 minutes, 3-4 times daily. I recommend following with your primary care provider in 2 days for wound recheck. Please return to the Emergency Department if symptoms worsen or new onset of fever, abdominal pain, vomiting, rectal pain, rectal bleeding, difficulty having a bowel movement, swelling, redness, discharge.

## 2016-10-02 NOTE — ED Triage Notes (Signed)
Patient c/o perirectal abscess that started on Ffriday. patient states that [facility] sent patient to ED for eval and see surgery since they didn't have any openings today. Patient denies any drainage

## 2016-10-03 ENCOUNTER — Telehealth: Payer: Self-pay | Admitting: Nurse Practitioner

## 2016-10-03 NOTE — Telephone Encounter (Signed)
Patient was seen at the ER yesterday for an abscess.  She was told to follow up at our office, but she is unsure if she can physically make it in.  She is requesting a call back from either Lisa [name] or Lisa [name] to discuss her situation to run some things by them.

## 2016-10-03 NOTE — Telephone Encounter (Signed)
Patient scheduled ER follow up with Hoyle Sauer 10/04/16

## 2016-10-04 ENCOUNTER — Ambulatory Visit (INDEPENDENT_AMBULATORY_CARE_PROVIDER_SITE_OTHER): Payer: 59 | Admitting: Nurse Practitioner

## 2016-10-04 ENCOUNTER — Encounter: Payer: Self-pay | Admitting: Family Medicine

## 2016-10-04 ENCOUNTER — Encounter: Payer: Self-pay | Admitting: Nurse Practitioner

## 2016-10-04 VITALS — BP 102/74 | Temp 98.5°F | Ht 69.0 in | Wt 237.2 lb

## 2016-10-04 DIAGNOSIS — K611 Rectal abscess: Secondary | ICD-10-CM | POA: Diagnosis not present

## 2016-10-04 NOTE — Progress Notes (Signed)
Subjective:  Presents for recheck on abscess on the inner right buttock near the gluteal fold. Started on Friday. Much worse over the weekend. On Monday was seen for I&D. Since then has drain large amounts of purulent material with some blood. Much improved in size. No fever. Still very tender. Has not been able to work this week due to pain. Initially patient states the area was as large as a baseball.  Objective:   BP 102/74   Temp 98.5 F (36.9 C) (Oral)   Ht 5\' 9"  (1.753 m)   Wt 237 lb 3.2 oz (107.6 kg)   BMI 35.03 kg/m  NAD. Alert, oriented. Small mass noted in the perirectal area right side more towards the anal area. A small open area is noted draining purulent bloody fluid. There is a small linear area going up from this. Entire area is tender to palpation. Minimal discoloration.  Assessment:  Perirectal abscess Possible pilonidal cyst  Plan:  Complete Cipro and Flagyl as directed. Warning signs reviewed. Warm sitz baths. Area is draining well. Expect gradual resolution. Call back next week if not resolved, sooner if any problems. Also if a pilonidal cyst is involved, symptoms may recur, patient to contact office at that time.

## 2016-10-04 NOTE — Patient Instructions (Signed)
Pilonidal cyst

## 2016-10-10 ENCOUNTER — Other Ambulatory Visit: Payer: Self-pay | Admitting: Family Medicine

## 2016-10-31 ENCOUNTER — Other Ambulatory Visit: Payer: Self-pay | Admitting: Nurse Practitioner

## 2016-11-09 ENCOUNTER — Ambulatory Visit (INDEPENDENT_AMBULATORY_CARE_PROVIDER_SITE_OTHER): Payer: 59 | Admitting: Nurse Practitioner

## 2016-11-09 ENCOUNTER — Encounter: Payer: Self-pay | Admitting: Nurse Practitioner

## 2016-11-09 VITALS — BP 122/74 | Ht 69.0 in | Wt 242.0 lb

## 2016-11-09 DIAGNOSIS — E890 Postprocedural hypothyroidism: Secondary | ICD-10-CM

## 2016-11-10 ENCOUNTER — Encounter: Payer: Self-pay | Admitting: Nurse Practitioner

## 2016-11-10 LAB — TSH: TSH: 2.52 u[IU]/mL (ref 0.450–4.500)

## 2016-11-10 NOTE — Progress Notes (Signed)
Subjective:  Presents for recheck on thyroid. Has had some weight gain but she relates this to stress and lack of time. No sore throat, difficulty swallowing or palpitations. Compliant with medication.   Objective:   BP 122/74   Ht 5\' 9"  (1.753 m)   Wt 242 lb (109.8 kg)   BMI 35.74 kg/m  NAD. Alert, oriented. Lungs clear. Heart RRR. Thyroid: right side surgically absent. Left side has a slightly rough texture but no mass or goiter noted. Non tender.   Assessment:   Problem List Items Addressed This Visit      Endocrine   Hypothyroidism - Primary   Relevant Orders   TSH (Completed)       Plan:  TSH pending. Continue current dose of medicine.

## 2016-11-18 ENCOUNTER — Other Ambulatory Visit: Payer: Self-pay | Admitting: Family Medicine

## 2016-11-23 ENCOUNTER — Encounter: Payer: Self-pay | Admitting: Nurse Practitioner

## 2016-11-23 ENCOUNTER — Ambulatory Visit (INDEPENDENT_AMBULATORY_CARE_PROVIDER_SITE_OTHER): Payer: 59 | Admitting: Nurse Practitioner

## 2016-11-23 VITALS — BP 120/80 | Temp 98.3°F | Ht 69.0 in | Wt 241.4 lb

## 2016-11-23 DIAGNOSIS — G5692 Unspecified mononeuropathy of left upper limb: Secondary | ICD-10-CM | POA: Diagnosis not present

## 2016-11-23 DIAGNOSIS — R42 Dizziness and giddiness: Secondary | ICD-10-CM | POA: Diagnosis not present

## 2016-11-23 DIAGNOSIS — R930 Abnormal findings on diagnostic imaging of skull and head, not elsewhere classified: Secondary | ICD-10-CM

## 2016-11-23 DIAGNOSIS — G519 Disorder of facial nerve, unspecified: Secondary | ICD-10-CM

## 2016-11-23 DIAGNOSIS — R9082 White matter disease, unspecified: Secondary | ICD-10-CM

## 2016-11-23 NOTE — Progress Notes (Signed)
Subjective:  Presents for complaints of severe itching only on the left side of the face for the past 10 days. No rash. No pain. Slight tingling. States it feels "different". Has had an eye exam within the past few months that was normal. No significant visual changes. Yesterday began having slight tingling sensation going up and down the arm at times. No weakness or numbness. Symptoms somewhat relieved with ibuprofen. This morning began having spells of dizziness unassociated with position change. No specific triggers. Comes and goes. Slight nausea, no vomiting. No fever. No headache. Had a workup with neurology for an abnormal MRI in 2012, at that time no further workup was needed.  Objective:   BP 120/80   Temp 98.3 F (36.8 C) (Oral)   Ht 5\' 9"  (1.753 m)   Wt 241 lb 6 oz (109.5 kg)   BMI 35.64 kg/m  NAD. Alert, oriented. Cheerful affect. TMs mild clear effusion, no erythema. Pupils equal and reactive to light. EOMs intact without nystagmus. Cranial nerves grossly intact. Point-to-point localization normal limit. No rash noted on the left side of the face. Pharynx clear. Neck supple with minimal adenopathy. Lungs clear Heart regular rate rhythm. Hand strength 5+ bilateral. Reflexes normal limit lower extremities Romberg negative. Gait normal limit. MRI dated 06/28/10 showed an abnormal signal with a nonspecific lesion in the right parietal white matter. See report.  Assessment:  White matter abnormality on MRI of brain - Plan: MR Brain Wo Contrast  Facial neuropathy - Plan: MR Brain Wo Contrast  Neuropathy, arm, left - Plan: MR Brain Wo Contrast  Dizziness - Plan: MR Brain Wo Contrast    Plan:    Repeat MRI of the brain without contrast to reevaluate lesion. Warning signs review Patient to call or go to ED sooner if symptoms worsen. Meclizine as directed for dizziness. Use caution with driving.

## 2016-11-23 NOTE — Patient Instructions (Signed)
Meclizine 25 mg every 6 hours as needed   

## 2016-11-29 ENCOUNTER — Other Ambulatory Visit: Payer: Self-pay | Admitting: Nurse Practitioner

## 2016-11-29 ENCOUNTER — Telehealth: Payer: Self-pay | Admitting: *Deleted

## 2016-11-29 NOTE — Telephone Encounter (Signed)
MRI of Brain w/o contrast scheduled for 12/05/16 requiring peer to peer. Information and phone number in yellow folder on desk.

## 2016-11-30 ENCOUNTER — Other Ambulatory Visit: Payer: Self-pay | Admitting: *Deleted

## 2016-11-30 DIAGNOSIS — R42 Dizziness and giddiness: Secondary | ICD-10-CM

## 2016-11-30 NOTE — Telephone Encounter (Signed)
Approval done for MRI of the brain WITH AND WITHOUT contrast; needs Met 7 first. See form at nurses desk

## 2016-11-30 NOTE — Telephone Encounter (Signed)
Scan rescheduled to MRI brain with and without contrast- no met 7 needed per cone unless patient has kidney issues or diabetes.

## 2016-12-05 ENCOUNTER — Ambulatory Visit (HOSPITAL_COMMUNITY): Admission: RE | Admit: 2016-12-05 | Payer: 59 | Source: Ambulatory Visit

## 2017-06-08 ENCOUNTER — Other Ambulatory Visit: Payer: Self-pay | Admitting: Family Medicine

## 2017-08-26 ENCOUNTER — Telehealth: Payer: Self-pay | Admitting: *Deleted

## 2017-08-26 ENCOUNTER — Other Ambulatory Visit: Payer: Self-pay | Admitting: *Deleted

## 2017-08-26 MED ORDER — AZITHROMYCIN 250 MG PO TABS: ORAL_TABLET | ORAL | 0 refills | Status: DC

## 2017-08-26 NOTE — Telephone Encounter (Signed)
I would recommend Z-Pak as directed-follow-up here or urgent care if any ongoing troubles

## 2017-08-26 NOTE — Telephone Encounter (Signed)
Med sent to pharm. Pt notified.  

## 2017-08-26 NOTE — Telephone Encounter (Signed)
Pt's husband diagnosed with strep at ED yesterday and she is in [place] [facility].  Woke this morning with a bad sore throat. No other symptoms. Can antibiotic be called in. [facility] [address] [place].

## 2017-08-26 NOTE — Telephone Encounter (Signed)
Patient's husband called requesting to speak with Lisa [name] regarding patient. Please advise

## 2017-09-08 ENCOUNTER — Other Ambulatory Visit: Payer: Self-pay | Admitting: Nurse Practitioner

## 2017-10-28 ENCOUNTER — Ambulatory Visit: Payer: 59 | Admitting: Nurse Practitioner

## 2017-11-08 ENCOUNTER — Ambulatory Visit: Payer: 59 | Admitting: Nurse Practitioner

## 2018-01-01 ENCOUNTER — Other Ambulatory Visit: Payer: Self-pay

## 2018-01-01 MED ORDER — PROPRANOLOL HCL ER 80 MG PO CP24: ORAL_CAPSULE | ORAL | 0 refills | Status: DC

## 2018-01-17 ENCOUNTER — Other Ambulatory Visit: Payer: Self-pay | Admitting: *Deleted

## 2018-01-17 MED ORDER — LEVOTHYROXINE SODIUM 100 MCG PO TABS: ORAL_TABLET | ORAL | 1 refills | Status: DC

## 2018-01-17 NOTE — Telephone Encounter (Signed)
May have 30 day with 1 refill needs labs and ov

## 2018-02-11 ENCOUNTER — Encounter: Payer: Self-pay | Admitting: Family Medicine

## 2018-02-11 ENCOUNTER — Ambulatory Visit (INDEPENDENT_AMBULATORY_CARE_PROVIDER_SITE_OTHER): Payer: 59 | Admitting: Family Medicine

## 2018-02-11 VITALS — BP 118/82 | Ht 69.0 in | Wt 246.8 lb

## 2018-02-11 DIAGNOSIS — F419 Anxiety disorder, unspecified: Secondary | ICD-10-CM | POA: Diagnosis not present

## 2018-02-11 DIAGNOSIS — Z0001 Encounter for general adult medical examination with abnormal findings: Secondary | ICD-10-CM | POA: Diagnosis not present

## 2018-02-11 DIAGNOSIS — G43009 Migraine without aura, not intractable, without status migrainosus: Secondary | ICD-10-CM | POA: Diagnosis not present

## 2018-02-11 DIAGNOSIS — Z131 Encounter for screening for diabetes mellitus: Secondary | ICD-10-CM | POA: Diagnosis not present

## 2018-02-11 DIAGNOSIS — E890 Postprocedural hypothyroidism: Secondary | ICD-10-CM

## 2018-02-11 DIAGNOSIS — Z13220 Encounter for screening for lipoid disorders: Secondary | ICD-10-CM | POA: Diagnosis not present

## 2018-02-11 MED ORDER — PROPRANOLOL HCL ER 80 MG PO CP24: ORAL_CAPSULE | ORAL | 5 refills | Status: DC

## 2018-02-11 MED ORDER — CITALOPRAM HYDROBROMIDE 10 MG PO TABS: 10.0000 mg | ORAL_TABLET | Freq: Every day | ORAL | 2 refills | Status: DC

## 2018-02-11 MED ORDER — LEVOTHYROXINE SODIUM 100 MCG PO TABS: ORAL_TABLET | ORAL | 5 refills | Status: DC

## 2018-02-11 NOTE — Progress Notes (Signed)
Subjective:    Patient ID: Lisa [name], female    DOB: 02/11/1981, 37 y.o.   MRN: [id number]  HPI  The patient comes in today for a wellness visit.  A review of their health history was completed.  A review of medications was also completed.  Any needed refills; yes  Eating habits: trying to eat healthy  Falls/  MVA accidents in past few months: none  Regular exercise: no- chasing kids  Specialist pt sees on regular basis: no  Sees GYN for pap smear and women's health- has appt in November 2019.  Preventative health issues were discussed. Declines flu vaccine.   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  Reports significant stress with this. Denies SI/HI. Has done counseling in the past but had a bad experience with provider wanting to put her on several different medications. Has previously been on zoloft and effexor in the past but did not like how they made her feel. Reports taking rare xanax from old rx with some relief.   Migraines: taking propranolol daily to prevent migraines. Gets a migraine about once every other month, under good control.   Thyroid: Reports once every other month has issues with swelling and feels difficulty swallowing, typically resolves same day. Compliant with medication.   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 Reports over the last several months having a feeling "like someone placed an ice cube" on her right hand, and increased weakness to upper extremities. Reports she saw Hoyle Sauer, NP last year for some similar symptoms and was supposed to get an MRI of her brain but was unable to get this completed.   Review of Systems  Constitutional: Negative for chills, fatigue,  fever and unexpected weight change.  HENT: Negative for congestion, ear pain, sinus pressure, sinus pain and sore throat.   Eyes: Negative for discharge and visual disturbance.  Respiratory: Negative for cough, shortness of breath and wheezing.   Cardiovascular: Negative for chest pain and leg swelling.  Gastrointestinal: Negative for abdominal pain, blood in stool, constipation, diarrhea, nausea and vomiting.  Genitourinary: Negative for difficulty urinating and hematuria.  Neurological: Negative for dizziness, weakness, light-headedness and headaches.  Hematological: Negative for adenopathy.  Psychiatric/Behavioral: Negative for suicidal ideas.  All other systems reviewed and are negative.     Objective:   Physical Exam  Constitutional: She is oriented to person, place, and time. She appears well-developed and well-nourished. No distress.  HENT:  Head: Normocephalic and atraumatic.  Right Ear: Tympanic membrane normal.  Left Ear: Tympanic membrane normal.  Mouth/Throat: Uvula is midline and oropharynx is clear and moist.  Eyes: Pupils are equal, round, and reactive to light. Conjunctivae and EOM are normal. Right eye exhibits no discharge. Left eye exhibits no discharge.  Neck: Neck supple. No thyromegaly present.  Cardiovascular: Normal rate, regular rhythm and normal heart sounds.  No murmur heard. Pulmonary/Chest: Effort normal and breath sounds normal. No respiratory distress. She has no wheezes.  Abdominal: Soft. Bowel sounds are normal. She exhibits no distension and no mass. There is tenderness (mild RUQ tenderness, has had US done in past that was normal.).  Genitourinary:  Genitourinary Comments: Breast and GU exam deferred - will be done by GYN  Musculoskeletal: She exhibits no edema or deformity.  Lymphadenopathy:    She has no cervical adenopathy.  Neurological: She is alert and oriented to person, place, and time. Coordination normal.  Skin: Skin is warm and dry.    Psychiatric: She has a normal mood and affect.  Nursing note and vitals reviewed.     Assessment & Plan:  1. Encounter for well adult exam with abnormal findings Adult wellness-complete.wellness physical was conducted today. Importance of diet and exercise were discussed in detail.  In addition to this a discussion regarding safety was also covered. We also reviewed over immunizations and gave recommendations regarding current immunization needed for age.  In addition to this additional areas were also touched on including: Preventative health exams needed:  Pap Smear due: pt has appt. With GYN next month  Declines flu vaccine  Patient was advised yearly wellness exam. Screening labs ordered (lipid, met 7), will notify of results.  2. Anxiety - Plan: Ambulatory referral to Psychiatry Start on celexa 10 mg daily. Educated pt to start by taking 1/2 tablet daily for the first week, then increase to 1 tablet daily. She should send [place] an update in a few weeks. And f/u appt in 2 months for f/u on anxiety. Also recommend counseling services, pt is open to this, referral placed.   3. Postoperative hypothyroidism - Plan: TSH Doing well on current dose of levothyroxine. Awaiting TSH results, will notify pt. Medication refilled.  4. Migraine without aura and without status migrainosus, not intractable Doing well on current dose of propranolol. Will refill.  Pt reports concerning neurological symptoms. Recommend she keep a log of her symptoms over the next several weeks and f/u with [place] in the next month or so.  Dr. Sallee Lange was consulted on this case and is in agreement with the above treatment plan.  As attending physician to this patient visit, this patient was seen in conjunction with the nurse practitioner.  The history,physical and treatment plan was reviewed with the nurse practitioner and pertinent findings were verified with the patient.  Also the treatment plan was reviewed with the  patient while they were present. SAL

## 2018-02-12 ENCOUNTER — Encounter: Payer: Self-pay | Admitting: Family Medicine

## 2018-02-12 ENCOUNTER — Telehealth: Payer: Self-pay | Admitting: Family Medicine

## 2018-02-12 DIAGNOSIS — E890 Postprocedural hypothyroidism: Secondary | ICD-10-CM | POA: Diagnosis not present

## 2018-02-12 DIAGNOSIS — Z13220 Encounter for screening for lipoid disorders: Secondary | ICD-10-CM | POA: Diagnosis not present

## 2018-02-12 DIAGNOSIS — Z131 Encounter for screening for diabetes mellitus: Secondary | ICD-10-CM | POA: Diagnosis not present

## 2018-02-12 NOTE — Telephone Encounter (Signed)
Form was completed thank you 

## 2018-02-12 NOTE — Telephone Encounter (Signed)
Filled in as much as I could placed in Dr.Scotts box in office.

## 2018-02-12 NOTE — Telephone Encounter (Signed)
Pt dropped off Medical Evaluation form to be filled out. Placed in nurses form box.

## 2018-02-13 LAB — BASIC METABOLIC PANEL
BUN/Creatinine Ratio: 12 (ref 9–23)
BUN: 9 mg/dL (ref 6–20)
CALCIUM: 8.8 mg/dL (ref 8.7–10.2)
CHLORIDE: 105 mmol/L (ref 96–106)
CO2: 22 mmol/L (ref 20–29)
Creatinine, Ser: 0.76 mg/dL (ref 0.57–1.00)
GFR calc non Af Amer: 102 mL/min/{1.73_m2} (ref >59)
GFR, EST AFRICAN AMERICAN: 118 mL/min/{1.73_m2} (ref >59)
Glucose: 97 mg/dL (ref 65–99)
POTASSIUM: 4.6 mmol/L (ref 3.5–5.2)
Sodium: 141 mmol/L (ref 134–144)

## 2018-02-13 LAB — LIPID PANEL
Chol/HDL Ratio: 3.2 ratio (ref 0.0–4.4)
Cholesterol, Total: 145 mg/dL (ref 100–199)
HDL: 46 mg/dL (ref >39)
LDL Calculated: 88 mg/dL (ref 0–99)
TRIGLYCERIDES: 55 mg/dL (ref 0–149)
VLDL Cholesterol Cal: 11 mg/dL (ref 5–40)

## 2018-02-13 LAB — TSH: TSH: 3.07 u[IU]/mL (ref 0.450–4.500)

## 2018-02-27 ENCOUNTER — Encounter: Payer: Self-pay | Admitting: Family Medicine

## 2018-02-28 MED ORDER — CITALOPRAM HYDROBROMIDE 10 MG PO TABS: 20.0000 mg | ORAL_TABLET | Freq: Every day | ORAL | 0 refills | Status: DC

## 2018-03-05 ENCOUNTER — Telehealth: Payer: Self-pay | Admitting: Family Medicine

## 2018-03-05 MED ORDER — CITALOPRAM HYDROBROMIDE 10 MG PO TABS: 20.0000 mg | ORAL_TABLET | Freq: Every day | ORAL | 1 refills | Status: DC

## 2018-03-05 NOTE — Telephone Encounter (Signed)
I sent in the medication to requested pharmacy. I called and left a message to r/c.

## 2018-03-05 NOTE — Telephone Encounter (Signed)
Please advise 

## 2018-03-05 NOTE — Telephone Encounter (Signed)
Pt returned call. Made aware medication was sent to pharmacy and to keep follow up in Dec.

## 2018-03-05 NOTE — Telephone Encounter (Signed)
Pt states she was informed to start taking 20mg  of citalopram (CELEXA) tablet, Pt needs refill for medication as 20mg  tab not 10. Advise.    [id number] - [place], [place] [address]

## 2018-03-05 NOTE — Telephone Encounter (Signed)
Please send in 20 mg tablets, #30, 1 refill, keep follow-up office visit in December

## 2018-03-05 NOTE — Telephone Encounter (Signed)
Pt returned call. See phone message.

## 2018-04-14 ENCOUNTER — Ambulatory Visit (INDEPENDENT_AMBULATORY_CARE_PROVIDER_SITE_OTHER): Payer: 59 | Admitting: Family Medicine

## 2018-04-14 ENCOUNTER — Encounter: Payer: Self-pay | Admitting: Family Medicine

## 2018-04-14 VITALS — BP 130/90 | Wt 239.2 lb

## 2018-04-14 DIAGNOSIS — F419 Anxiety disorder, unspecified: Secondary | ICD-10-CM

## 2018-04-14 MED ORDER — CITALOPRAM HYDROBROMIDE 40 MG PO TABS: 40.0000 mg | ORAL_TABLET | Freq: Every day | ORAL | 2 refills | Status: DC

## 2018-04-14 NOTE — Patient Instructions (Signed)
Please send [place] the log of your neuro symptoms as soon as you can! Thanks!

## 2018-04-14 NOTE — Progress Notes (Signed)
Subjective:    Patient ID: Lisa [name], female    DOB: 04-27-1980, 37 y.o.   MRN: [id number]  Anxiety  Presents for follow-up visit. Patient reports no nervous/anxious behavior or suicidal ideas. Primary symptoms comment: pt states no problems, doing great.    Reports anxiety symptoms are much better on 20 mg of celexa, has been on this dose for about 6 weeks now, originally started on 10 mg dose for 2 weeks prior to the increase to 20 mg. Has not started counseling. Still feels agitated and irritated at things she feels like she shouldn't be feeling that way towards, happening about 2 times per week now where before it was daily. Denies SI/HI.     Review of Systems  Psychiatric/Behavioral: Negative for dysphoric mood, sleep disturbance and suicidal ideas. The patient is not nervous/anxious.        Objective:   Physical Exam Vitals signs and nursing note reviewed.  Constitutional:      General: She is not in acute distress.    Appearance: She is well-developed.  HENT:     Head: Normocephalic and atraumatic.  Neck:     Musculoskeletal: Neck supple.  Cardiovascular:     Rate and Rhythm: Normal rate and regular rhythm.     Heart sounds: Normal heart sounds. No murmur.  Pulmonary:     Effort: Pulmonary effort is normal. No respiratory distress.     Breath sounds: Normal breath sounds.  Skin:    General: Skin is warm and dry.  Neurological:     Mental Status: She is alert and oriented to person, place, and time.     Depression screen [facility] 2/9 04/14/2018 02/11/2018 02/11/2018  Decreased Interest 0 0 0  Down, Depressed, Hopeless 0 0 0  PHQ - 2 Score 0 0 0  Altered sleeping 1 2 -  Tired, decreased energy 0 1 -  Change in appetite 0 1 -  Feeling bad or failure about yourself  0 0 -  Trouble concentrating 1 1 -  Moving slowly or fidgety/restless 0 0 -  Suicidal thoughts 0 0 -  PHQ-9 Score 2 5 -  Difficult doing work/chores Not difficult at all Somewhat difficult -    GAD 7 : Generalized Anxiety Score 04/14/2018 02/11/2018  Nervous, Anxious, on Edge 1 3  Control/stop worrying 1 3  Worry too much - different things 1 3  Trouble relaxing 1 3  Restless 1 3  Easily annoyed or irritable 1 3  Afraid - awful might happen 1 3  Total GAD 7 Score 7 21  Anxiety Difficulty Somewhat difficult Extremely difficult          Assessment & Plan:  Anxiety  PHQ 9 and GAD 7 results reviewed with patient. Doing well on celexa 20 mg dose, but still having some anxiety symptoms, would like to try an increased dose to see if this helps alleviate further. Will increase celexa to 40 mg daily. Discussed interactions with NSAIDs, and to avoid these. Recommend counseling from previous referral. Encouraged exercise and healthy diet. Warning signs discussed. F/u in 2-3 months to see how she is doing on new dose, may send mychart message sooner if having problems with it.   Pt has log of neuro symptoms at home that she was supposed to bring in for [place] to evaluate, recommend she send that in to [place] as soon as she can and we will look over and determine if a follow-up appt is needed or possible imaging  studies. An MRI was ordered previously but was unable to get this done d/t health problems with her children. Also reports that she feels like her symptoms have somewhat improved since anxiety has improved. Will review log when she has turned in and determine next course of action.   Dr. Sallee Lange was consulted on this case and is in agreement with the above treatment plan.

## 2018-04-29 ENCOUNTER — Encounter: Payer: Self-pay | Admitting: Family Medicine

## 2018-04-29 ENCOUNTER — Ambulatory Visit: Payer: 59 | Admitting: Family Medicine

## 2018-04-29 VITALS — BP 100/74 | Ht 69.0 in | Wt 238.0 lb

## 2018-04-29 DIAGNOSIS — F419 Anxiety disorder, unspecified: Secondary | ICD-10-CM

## 2018-04-29 DIAGNOSIS — Z87898 Personal history of other specified conditions: Secondary | ICD-10-CM

## 2018-04-29 DIAGNOSIS — R200 Anesthesia of skin: Secondary | ICD-10-CM | POA: Insufficient documentation

## 2018-04-29 MED ORDER — CITALOPRAM HYDROBROMIDE 40 MG PO TABS: 40.0000 mg | ORAL_TABLET | Freq: Every day | ORAL | 5 refills | Status: DC

## 2018-04-29 MED ORDER — PREDNISONE 20 MG PO TABS: ORAL_TABLET | ORAL | 0 refills | Status: DC

## 2018-04-29 NOTE — Progress Notes (Signed)
Subjective:    Patient ID: Lisa [name], female    DOB: 04-05-1981, 38 y.o.   MRN: [id number]  HPI Patient is here today to follow up on her chronic health issues.  She has a history of Hypothyroidism and is currently taking Levothyroxine 100 mg  A history of anxiety and is taking Celexa 40 mg currently. She states this was increased from 20 mg on Dec 30,2019. She did start the medication on December 30,2019 and had no problems until this past Friday April 25, 2018. Feels like anxiety is so much better on increased dose of celexa.   Per pt when she woke on the 10 th felt like her left leg was asleep,this has worsened over the last few days. She states there is a pain occasionally that in the left buttock and in the knee area of the same leg. She states she can walk and bare weight,but her leg feels "Heavy".  Reports left leg numbness and heaviness x 4 days now. Had one fall yesterday morning when getting up from the toilet and bearing weight on leg. Reports able to walk okay otherwise. Reports balance feels off because her leg feels weird. Numbness starts from left buttocks down to ankle, whole leg affected. Foot is not affected. Reports some pain to buttocks and back of knee - described as sharp pain lasting only a second has only occurred a couple times.   Denies any injury.   Hx of having numbness to right hand/arm - lasting maybe a day, intermittent. Has had numbness to one cheek or the other. Feels like once a month or so has the numb feeling somewhere but nothing that has lasted this long. Has noticed feeling weak at times, but not correlated with numbness. Has had instances over the last 6 months where she picks up things and drops them without realizing, unsure if it's one side or the other - happens a couple times per week.   Review of Systems  Constitutional: Negative for chills, fever and unexpected weight change.  Respiratory: Negative for shortness of breath.     Cardiovascular: Negative for chest pain and leg swelling.  Neurological: Positive for numbness. Negative for dizziness, syncope, facial asymmetry, speech difficulty, weakness and headaches.  Psychiatric/Behavioral: Negative for suicidal ideas. The patient is not nervous/anxious.        Objective:   Physical Exam Vitals signs and nursing note reviewed.  Constitutional:      General: She is not in acute distress.    Appearance: Normal appearance. She is not toxic-appearing.  HENT:     Head: Normocephalic and atraumatic.     Right Ear: Tympanic membrane normal.     Left Ear: Tympanic membrane normal.     Nose: Nose normal.     Mouth/Throat:     Mouth: Mucous membranes are moist.     Pharynx: Oropharynx is clear.  Eyes:     General:        Right eye: No discharge.        Left eye: No discharge.     Extraocular Movements: Extraocular movements intact.     Pupils: Pupils are equal, round, and reactive to light.  Neck:     Musculoskeletal: Neck supple. No neck rigidity.  Cardiovascular:     Rate and Rhythm: Normal rate and regular rhythm.     Heart sounds: Normal heart sounds.  Pulmonary:     Effort: Pulmonary effort is normal. No respiratory distress.     Breath  sounds: Normal breath sounds.  Musculoskeletal:     Right lower leg: No edema.     Left lower leg: No edema.     Comments: Negative SLR bilaterally  Lymphadenopathy:     Cervical: No cervical adenopathy.  Skin:    General: Skin is warm and dry.  Neurological:     General: No focal deficit present.     Mental Status: She is alert and oriented to person, place, and time.     Coordination: Romberg sign negative. Finger-Nose-Finger Test normal.     Gait: Gait is intact. Gait normal.     Deep Tendon Reflexes:     Reflex Scores:      Patellar reflexes are 2+ on the right side and 2+ on the left side.    Comments: Decreased sensation noted to left lower extremity when compared to right. Strength intact.   Psychiatric:         Mood and Affect: Mood normal.           Assessment & Plan:  1. Numbness in left leg - Plan: MR Brain W Wo Contrast Lengthy discussion with patient regarding her symptoms of numbness that has been intermittent over the last 6 months or longer.  Chart review done.  Patient had MRI of the brain back in 2012 showing 1 lesion potentially concerning for MS.  Patient states she followed up with neurology and was told one lesion was not concerning and no further follow-up was done.  Discussed with patient given her symptoms recommend a repeat MRI of the brain to potentially rule out MS or other neurologic disease.  Based on results may need follow-up with neurology.  We will also give a trial of prednisone taper to see if this helps improve her current symptoms.  Follow-up if symptoms worsen or fail to improve.  2. History of numbness - Plan: MR Brain W Wo Contrast See #1 plan.  3. Anxiety Doing well on the 40 mg dose of Celexa.  Will continue.  Discussed with patient Celexa not likely to cause the symptoms she is experiencing.  Recommend follow-up in 6 months for anxiety.  Dr. Sallee Lange was consulted on this case and is in agreement with the above treatment plan.

## 2018-05-02 ENCOUNTER — Telehealth: Payer: Self-pay | Admitting: *Deleted

## 2018-05-02 ENCOUNTER — Emergency Department (HOSPITAL_COMMUNITY)
Admission: EM | Admit: 2018-05-02 | Discharge: 2018-05-02 | Disposition: A | Discharge status: Home / Self Care | Payer: 59 | Attending: Emergency Medicine | Admitting: Emergency Medicine

## 2018-05-02 ENCOUNTER — Emergency Department (HOSPITAL_COMMUNITY): Payer: 59

## 2018-05-02 ENCOUNTER — Other Ambulatory Visit: Payer: Self-pay

## 2018-05-02 ENCOUNTER — Telehealth: Payer: Self-pay | Admitting: Family Medicine

## 2018-05-02 ENCOUNTER — Encounter (HOSPITAL_COMMUNITY): Payer: Self-pay | Admitting: Emergency Medicine

## 2018-05-02 DIAGNOSIS — I10 Essential (primary) hypertension: Secondary | ICD-10-CM | POA: Diagnosis not present

## 2018-05-02 DIAGNOSIS — Z79899 Other long term (current) drug therapy: Secondary | ICD-10-CM | POA: Insufficient documentation

## 2018-05-02 DIAGNOSIS — Z87891 Personal history of nicotine dependence: Secondary | ICD-10-CM | POA: Diagnosis not present

## 2018-05-02 DIAGNOSIS — M5020 Other cervical disc displacement, unspecified cervical region: Secondary | ICD-10-CM

## 2018-05-02 DIAGNOSIS — E039 Hypothyroidism, unspecified: Secondary | ICD-10-CM | POA: Diagnosis not present

## 2018-05-02 DIAGNOSIS — R200 Anesthesia of skin: Secondary | ICD-10-CM

## 2018-05-02 DIAGNOSIS — M4802 Spinal stenosis, cervical region: Secondary | ICD-10-CM

## 2018-05-02 DIAGNOSIS — R202 Paresthesia of skin: Secondary | ICD-10-CM

## 2018-05-02 DIAGNOSIS — G35 Multiple sclerosis: Secondary | ICD-10-CM | POA: Diagnosis not present

## 2018-05-02 LAB — CREATININE, SERUM
CREATININE: 0.74 mg/dL (ref 0.44–1.00)
GFR calc Af Amer: >60 mL/min (ref >60)
GFR calc non Af Amer: >60 mL/min (ref >60)

## 2018-05-02 MED ORDER — GADOBUTROL 1 MMOL/ML IV SOLN
10.0000 mL | Freq: Once | INTRAVENOUS | Status: AC | PRN
  Administered 2018-05-02: 10 mL via INTRAVENOUS

## 2018-05-02 NOTE — ED Notes (Signed)
Patient transported to MRI 

## 2018-05-02 NOTE — ED Provider Notes (Signed)
Patient care was taken over from Dr. Tyrone Nine.  She was awaiting MRI.  She has some numbness to her leg and arm.  MRI does not show any new lesions.  There is one demyelinating lesion is stable from 2012.  There is a new herniated disc in her cervical spine.  No significant nerve impingement or deficits.  She was seen by neurology in the ED.  He felt she was stable for discharge with outpatient follow-up with her PCP.  I discussed this with the patient.  She will follow-up with her PCP and possibly will need referral to a spine surgeon.   Malvin Johns, MD 05/02/18 [id number]

## 2018-05-02 NOTE — Telephone Encounter (Signed)
Recently seen for numbness. Numbness is getting worse. Numbness on outside of arms/ elbow area.  Hands are not numb but not able to feel temperature. Was washing hands this morning and thought the water was cold but noticied her hands were red and steam coming from water. Husband checked and it was hot water. MRI scheduled Sunday. Pt states she is fine waiting for MRI but wants to know if there is something she should be doing now. Does not feel well today. Feels like she is run down. Not herself today.

## 2018-05-02 NOTE — Telephone Encounter (Signed)
Discussed pt's symptoms with Dr. Nicki Reaper. Spoke with patient on the phone. Based on her worsening symptoms instructed patient that this needs evaluation in the ED. Instructed to go to either [facility] or [facility] ED today where they have neurologists available at all times. Instructed if they evaluate her and send her home without doing an MRI to keep her MRI appt on Sunday, otherwise if an MRI is done this should be cancelled. Pt verbalized understanding.

## 2018-05-02 NOTE — Consult Note (Signed)
Neurology Consultation  Reason for Consult: Upper and lower extremity numbness Referring Physician: Lauretta Chester, MD  CC: Upper and lower extremity numbness  History is obtained from: Patient  HPI: Lisa [name] is a 38 y.o. female with hx anxiety, headache p/w Upper and lower ext weakness. Pt reports having numbness in her left leg for quite a while which was attributed with discs in her back. However, after recently increasing her celexa for anxiety, she started experiencing numbness in her left arm. Pt saw her PCP who started her on Prednisone.  This morning, pt reports not being able to feel the hot water while doing the dishes.  She called her PCP who sent her to the ER. She reports having an MRI 7 years ago with a single lesion concerning for demyelination.  She denies any hx of optic neuritis.   ROS: A 14 point ROS was performed and is negative except as noted in the HPI.  Unable to obtain due to altered mental status.   Past Medical History:  Diagnosis Date  . Anxiety   . Headache(784.0)   . Hypertension   . Neuromuscular disorder ([place])    sus  . PONV (postoperative nausea and vomiting)   . Thyroid nodule    No family history on file.   Social History:   reports that she has quit smoking. She has never used smokeless tobacco. She reports current alcohol use. She reports that she does not use drugs.  Medications No current facility-administered medications for this encounter.   Current Outpatient Medications:  .  citalopram (CELEXA) 40 MG tablet, Take 1 tablet (40 mg total) by mouth daily., Disp: 30 tablet, Rfl: 5 .  levothyroxine (SYNTHROID, LEVOTHROID) 100 MCG tablet, TAKE 1 TABLET BY MOUTH DAILY BEFORE BREAKFAST, Disp: 30 tablet, Rfl: 5 .  predniSONE (DELTASONE) 20 MG tablet, Take 3 tab qd po x 3 days, then 2 tab qd po x 3 days, then 1 tab qd po x 3 days., Disp: 18 tablet, Rfl: 0 .  propranolol ER (INDERAL LA) 80 MG 24 hr capsule, TAKE 1 CAPSULE BY MOUTH EVERY  DAY, Disp: 30 capsule, Rfl: 5   Exam: Current vital signs: BP 125/70 (BP Location: Right Arm)   Pulse 70   Temp 98 F (36.7 C) (Oral)   Resp 16   Ht 5\' 9"  (1.753 m)   Wt 108 kg   LMP 04/24/2018   SpO2 100%   BMI 35.15 kg/m  Vital signs in last 24 hours: Temp:  [98 F (36.7 C)] 98 F (36.7 C) (01/17 1110) Pulse Rate:  [70] 70 (01/17 1110) Resp:  [16] 16 (01/17 1110) BP: (125)/(70) 125/70 (01/17 1110) SpO2:  [100 %] 100 % (01/17 1110) Weight:  [737 kg] 108 kg (01/17 1114)  Physical Exam  Constitutional: Appears well-developed and well-nourished.  Psych: Affect appropriate to situation Eyes: No scleral injection HENT: No OP obstrucion Head: Normocephalic.  Cardiovascular: Normal rate and regular rhythm.  Respiratory: Effort normal, non-labored breathing GI: Soft.  No distension. There is no tenderness.  Skin: WDI  Neuro: Mental Status: Patient is awake, alert, oriented to person, place, month, year, and situation. Patient is able to give a clear and coherent history. No signs of aphasia or neglect Cranial Nerves: II: Visual Fields are full. Pupils are equal, round, and reactive to light.   III,IV, VI: EOMI without ptosis or diploplia.  V: Facial sensation is symmetric to temperature VII: Facial movement is symmetric.  VIII: hearing is intact to  voice X: Uvula elevates symmetrically XI: Shoulder shrug is symmetric. XII: tongue is midline without atrophy or fasciculations.  Motor: Tone is normal. Bulk is normal. 5/5 strength was present in all four extremities.  Sensory: Equal sensation b/l UE to light touch and temperature, decreased sensation L LE to light touch and temperature Deep Tendon Reflexes: 2+ and symmetric in the biceps and patellae.  Plantars: Toes are downgoing bilaterally.  Cerebellar: FNF and HKS are intact bilaterally  Labs I have reviewed labs in epic and the results pertinent to this consultation are:   CBC    Component Value Date/Time    WBC 7.2 12/15/2015 1140   WBC 5.3 08/24/2012 2025   RBC 4.46 12/15/2015 1140   RBC 4.20 08/24/2012 2025   HGB 12.8 12/15/2015 1140   HCT 39.5 12/15/2015 1140   PLT 280 12/15/2015 1140   MCV 89 12/15/2015 1140   MCH 28.7 12/15/2015 1140   MCH 29.8 08/24/2012 2025   MCHC 32.4 12/15/2015 1140   MCHC 35.1 08/24/2012 2025   RDW 14.0 12/15/2015 1140   LYMPHSABS 1.5 12/15/2015 1140   MONOABS 0.3 02/25/2011 1230   EOSABS 0.1 12/15/2015 1140   BASOSABS 0.0 12/15/2015 1140    CMP     Component Value Date/Time   NA 141 02/12/2018 0849   K 4.6 02/12/2018 0849   CL 105 02/12/2018 0849   CO2 22 02/12/2018 0849   GLUCOSE 97 02/12/2018 0849   GLUCOSE 94 08/24/2012 2025   BUN 9 02/12/2018 0849   CREATININE 0.74 05/02/2018 1511   CALCIUM 8.8 02/12/2018 0849   PROT 6.6 11/22/2014 1455   ALBUMIN 4.3 11/22/2014 1455   AST 20 11/22/2014 1455   ALT 22 11/22/2014 1455   ALKPHOS 53 11/22/2014 1455   BILITOT 0.4 11/22/2014 1455   GFRNONAA >60 05/02/2018 1511   GFRAA >60 05/02/2018 1511    Lipid Panel     Component Value Date/Time   CHOL 145 02/12/2018 0849   TRIG 55 02/12/2018 0849   HDL 46 02/12/2018 0849   CHOLHDL 3.2 02/12/2018 0849   LDLCALC 88 02/12/2018 0849     Imaging  MRI brain, C and T spine Approximate 1 cm signal abnormality within the right parietal white matter but no active demyelination Left paracentral disc protrusion at C5-6 with secondary mild flattening of the left hemi cord and resultant mild spinal stenosis. Normal MRI appearance of the thoracic spinal cord. No evidence for demyelinating disease. Small disc protrusions at T4-5 and T6-7 without stenosis.  A/P: 38 yo F hx anxiety, headache, single demyelinating lesion on MRI brain p/w Upper and lower ext weakness found to have mild spinal stenosis C5-6  No new demyelinating lesions on MRI May follow up as outpatient  Total time spent 49min  Ray Church, MD Attending Neurologist

## 2018-05-02 NOTE — ED Triage Notes (Signed)
Pt reports left sided numbness ongoing for 1 week and currently being ruled out for MS- has an MRI scheduled- was told to come to ED today due to sink water burning her hand due to increase numbness was unable to feel water temp.

## 2018-05-02 NOTE — Telephone Encounter (Signed)
Ria Comment spoke with pt. And sent her to ED

## 2018-05-02 NOTE — ED Notes (Signed)
Pt given discharge instructions and follow up instructions. Pt verbalized understanding.

## 2018-05-02 NOTE — ED Provider Notes (Signed)
[facility] EMERGENCY DEPARTMENT Provider Note   CSN: [id number] Arrival date & time: 05/02/18  1102     History   Chief Complaint Chief Complaint  Patient presents with  . Numbness    HPI Lisa [name] is a 38 y.o. female.  38 yo F with a chief complaint of numbness.  Primarily this started on the left leg and now she notices it to the left arm from the elbow to the hand.  She actually almost burned herself earlier today because she could not feel the left hand.  She had up calling her family doctor who was following her for the initial numbness of the leg and had scheduled an MRI for Sunday.  With this new worsening neurologic symptom he sent her to the ED for evaluation.  The history is provided by the patient.  Illness  Severity:  Moderate Onset quality:  Sudden Duration:  1 week Timing:  Constant Progression:  Worsening Chronicity:  New Associated symptoms: no chest pain, no congestion, no fever, no headaches, no myalgias, no nausea, no rhinorrhea, no shortness of breath, no vomiting and no wheezing     Past Medical History:  Diagnosis Date  . Anxiety   . Headache(784.0)   . Hypertension   . Neuromuscular disorder ([place])    sus  . PONV (postoperative nausea and vomiting)   . Thyroid nodule     Patient Active Problem List   Diagnosis Date Noted  . History of numbness 04/29/2018  . Numbness in left leg 04/29/2018  . Gastroesophageal reflux disease without esophagitis 07/02/2015  . Hypothyroidism 10/24/2014  . Morbid obesity ([place]) 10/21/2014  . Thyroid activity decreased 07/16/2014  . Migraine without aura and without status migrainosus, not intractable 07/16/2014  . Anxiety 10/01/2012  . Thyroid nodule 06/18/2011    Past Surgical History:  Procedure Laterality Date  . ADENOIDECTOMY    . CESAREAN SECTION    . CESAREAN SECTION    . THYROIDECTOMY  06/18/2011   Procedure: THYROIDECTOMY;  Surgeon: Melida Quitter, MD;  Location: [facility];   Service: ENT;  Laterality: Right;  . TONSILLECTOMY    . TYMPANOPLASTY       OB History    Gravida  2   Para  2   Term  2   Preterm      AB      Living  2     SAB      TAB      Ectopic      Multiple      Live Births  2            Home Medications    Prior to Admission medications   Medication Sig Start Date End Date Taking? Authorizing Provider  citalopram (CELEXA) 40 MG tablet Take 1 tablet (40 mg total) by mouth daily. 04/29/18   Cheyenne Adas, NP  levothyroxine (SYNTHROID, LEVOTHROID) 100 MCG tablet TAKE 1 TABLET BY MOUTH DAILY BEFORE BREAKFAST 02/11/18   Cheyenne Adas, NP  predniSONE (DELTASONE) 20 MG tablet Take 3 tab qd po x 3 days, then 2 tab qd po x 3 days, then 1 tab qd po x 3 days. 04/29/18   Cheyenne Adas, NP  propranolol ER (INDERAL LA) 80 MG 24 hr capsule TAKE 1 CAPSULE BY MOUTH EVERY DAY 02/11/18   Cheyenne Adas, NP    Family History No family history on file.  Social History Social History   Tobacco Use  . Smoking status: Former Research scientist (life sciences)  .  Smokeless tobacco: Never Used  Substance Use Topics  . Alcohol use: Yes    Comment: 1/month  . Drug use: No     Allergies   Sulfa antibiotics; Darvocet [propoxyphene n-acetaminophen]; Cephalexin; Imitrex [sumatriptan base]; and Penicillins   Review of Systems Review of Systems  Constitutional: Negative for chills and fever.  HENT: Negative for congestion and rhinorrhea.   Eyes: Negative for redness and visual disturbance.  Respiratory: Negative for shortness of breath and wheezing.   Cardiovascular: Negative for chest pain and palpitations.  Gastrointestinal: Negative for nausea and vomiting.  Genitourinary: Negative for dysuria and urgency.  Musculoskeletal: Negative for arthralgias and myalgias.  Skin: Negative for pallor and wound.  Neurological: Positive for numbness. Negative for dizziness and headaches.     Physical Exam Updated Vital Signs BP 125/70 (BP Location: Right  Arm)   Pulse 70   Temp 98 F (36.7 C) (Oral)   Resp 16   Ht 5\' 9"  (1.753 m)   Wt 108 kg   LMP 04/24/2018   SpO2 100%   BMI 35.15 kg/m   Physical Exam Vitals signs and nursing note reviewed.  Constitutional:      General: She is not in acute distress.    Appearance: She is well-developed. She is not diaphoretic.  HENT:     Head: Normocephalic and atraumatic.  Eyes:     Pupils: Pupils are equal, round, and reactive to light.  Neck:     Musculoskeletal: Normal range of motion and neck supple.  Cardiovascular:     Rate and Rhythm: Normal rate and regular rhythm.     Heart sounds: No murmur. No friction rub. No gallop.   Pulmonary:     Effort: Pulmonary effort is normal.     Breath sounds: No wheezing or rales.  Abdominal:     General: There is no distension.     Palpations: Abdomen is soft.     Tenderness: There is no abdominal tenderness.  Musculoskeletal:        General: No tenderness.  Skin:    General: Skin is warm and dry.  Neurological:     Mental Status: She is alert and oriented to person, place, and time.     Comments: Degree sensation to light touch to the entire left lower extremity and the radial distribution of the left hand.  Pulse and motor are intact in the left upper and left lower extremity.  Psychiatric:        Behavior: Behavior normal.      ED Treatments / Results  Labs (all labs ordered are listed, but only abnormal results are displayed) Labs Reviewed  CREATININE, SERUM    EKG None  Radiology No results found.  Procedures Procedures (including critical care time)  Medications Ordered in ED Medications - No data to display   Initial Impression / Assessment and Plan / ED Course  I have reviewed the triage vital signs and the nursing notes.  Pertinent labs & imaging results that were available during my care of the patient were reviewed by me and considered in my medical decision making (see chart for details).     77y o F with  no discrete diagnosis of MS.  Here with a chief complaint of left lower extremity and left arm numbness.  This is been slowly worsening over the course of a week or so.  She has an MRI scheduled in 2 days time.  Due to the worsening weakness she was sent here by her  family doctor for emergent MRI.  I discussed the case with Dr. Cheral Marker, neurology with her having upper and lower extremity symptoms he recommended doing an MRI of the brain C and T-spine.  Patient care turned over to Dr. Tamera Punt, please see her note for further details of care in the ED.  The patients results and plan were reviewed and discussed.   Any x-rays performed were independently reviewed by myself.   Differential diagnosis were considered with the presenting HPI.  Medications - No data to display  Vitals:   05/02/18 1110 05/02/18 1114  BP: 125/70   Pulse: 70   Resp: 16   Temp: 98 F (36.7 C)   TempSrc: Oral   SpO2: 100%   Weight:  108 kg  Height:  5\' 9"  (1.753 m)    Final diagnoses:  Numbness      Final Clinical Impressions(s) / ED Diagnoses   Final diagnoses:  Numbness    ED Discharge Orders    None       Deno Etienne, DO 05/02/18 1558

## 2018-05-02 NOTE — Telephone Encounter (Signed)
Pt seen Ria Comment on 04/29/18 and following up from that visit pt is not feeling tempeture in her hands water will be hot she feels cold, more numbness in (L) leg, sensation is gone, she states she can feel to stand on in but cant feel when she rubs her hand against her (L) leg. Advise.

## 2018-05-04 ENCOUNTER — Inpatient Hospital Stay: Admission: RE | Admit: 2018-05-04 | Payer: Self-pay | Source: Ambulatory Visit

## 2018-05-05 ENCOUNTER — Telehealth: Payer: Self-pay | Admitting: Family Medicine

## 2018-05-05 DIAGNOSIS — R200 Anesthesia of skin: Secondary | ICD-10-CM

## 2018-05-05 DIAGNOSIS — R531 Weakness: Secondary | ICD-10-CM

## 2018-05-05 NOTE — Telephone Encounter (Signed)
Pt went to ER last week as instructed, Numbness (L) leg and (L) elbow remains, pt also states sensory aspect pt seems to have a (L) hand and side of body feels like its in the aspect of "alseep". Advise.

## 2018-05-05 NOTE — Telephone Encounter (Signed)
Patient states her symptoms are stable and she thinks she is fine to wait and see neurology. Patient states she is willing to see Dr Shelia Media again or someone else in [place] the doctor feels is the best to see for this.

## 2018-05-05 NOTE — Telephone Encounter (Signed)
Pt states she is getting a little worse since going to ED. No pain. Legs are a little worse. Feeling more heavy when walking up steps. Just wants to know what next step is.

## 2018-05-05 NOTE — Telephone Encounter (Signed)
So the next that would be is for [place] to get her in with neurology In the past she saw Dr.Pharr  Does the patient have a preference regarding this? We will do our best to get her in as soon as possible Urgent referral Also we are available this week to recheck her If she is getting worse or having muscle weakness I would recommend a recheck as well

## 2018-05-06 NOTE — Telephone Encounter (Signed)
Called to refer, they are checking with Dr. Shelia Media as to if she can see patient & when, they will call be back  Explained reason & that referral was urgent per Dr. Bary Leriche request

## 2018-05-06 NOTE — Telephone Encounter (Signed)
Dr. Smitty Cords is a good doctor.  I would recommend that we try to get a urgent consultation with them. Reason for the consultation is progressive numbness and weakness Please let me know how soon they will be able to see this patient thank you

## 2018-05-06 NOTE — Telephone Encounter (Signed)
Urgent referral put in. brendale please see message below. Dr Nicki Reaper wants to know when pt's appt is after its been scheduled.

## 2018-05-07 ENCOUNTER — Other Ambulatory Visit: Payer: Self-pay | Admitting: Family Medicine

## 2018-05-07 NOTE — Telephone Encounter (Signed)
According to the medical record and this was refilled along with 5 refills on 14 January If for some reason it was not refilled then may do refill with 5 refills

## 2018-05-13 ENCOUNTER — Telehealth: Payer: Self-pay | Admitting: Family Medicine

## 2018-05-13 ENCOUNTER — Encounter: Payer: Self-pay | Admitting: Family Medicine

## 2018-05-13 NOTE — Telephone Encounter (Signed)
Please advise. Thank you

## 2018-05-13 NOTE — Telephone Encounter (Signed)
[name]-please send patient my chart message encouraging her to bring MRI that she had via the emergency department on CD-ROM to her visit with a neurologist at [facility]  To the best my knowledge she will need to do the following- She should be able to get these on CD-ROM through the radiology department She will need to call ahead and go by and sign a release in order to get these Thanks  Typically the specialist would like to see the images as well as the report  Obviously all of the reports are available through my chart and care everywhere

## 2018-05-21 ENCOUNTER — Ambulatory Visit: Payer: 59 | Admitting: Family Medicine

## 2018-05-21 ENCOUNTER — Encounter: Payer: Self-pay | Admitting: Family Medicine

## 2018-05-21 VITALS — BP 110/68 | Temp 98.9°F | Wt 238.0 lb

## 2018-05-21 DIAGNOSIS — J111 Influenza due to unidentified influenza virus with other respiratory manifestations: Secondary | ICD-10-CM | POA: Diagnosis not present

## 2018-05-21 MED ORDER — OSELTAMIVIR PHOSPHATE 75 MG PO CAPS: 75.0000 mg | ORAL_CAPSULE | Freq: Two times a day (BID) | ORAL | 0 refills | Status: DC

## 2018-05-21 NOTE — Progress Notes (Signed)
   Subjective:    Patient ID: Lisa [name], female    DOB: May 17, 1980, 38 y.o.   MRN: [id number]  HPI Patient is here today states she has developed a cough over night then this am she "feels like there is water in her lungs", cant seem to catch her breath that great. She also reports a headache, no sore throat no other symptoms. Patient relates that she feels a little tight in her lungs.  She also states cough she states she feels mild headache she also relates that she just feels sluggish and rundown. Review of Systems  Constitutional: Negative for activity change and appetite change.  HENT: Negative for congestion and rhinorrhea.   Respiratory: Negative for cough and shortness of breath.   Cardiovascular: Negative for chest pain and leg swelling.  Gastrointestinal: Negative for abdominal pain, nausea and vomiting.  Skin: Negative for color change.  Neurological: Negative for dizziness and weakness.  Psychiatric/Behavioral: Negative for agitation and confusion.       Objective:   Physical Exam Vitals signs reviewed.  Constitutional:      General: She is not in acute distress. HENT:     Head: Normocephalic and atraumatic.  Eyes:     General:        Right eye: No discharge.        Left eye: No discharge.  Neck:     Trachea: No tracheal deviation.  Cardiovascular:     Rate and Rhythm: Normal rate and regular rhythm.     Heart sounds: Normal heart sounds. No murmur.  Pulmonary:     Effort: Pulmonary effort is normal. No respiratory distress.     Breath sounds: Normal breath sounds.  Lymphadenopathy:     Cervical: No cervical adenopathy.  Skin:    General: Skin is warm and dry.  Neurological:     Mental Status: She is alert.     Coordination: Coordination normal.  Psychiatric:        Behavior: Behavior normal.    this is the flu with this is more likely an issue that should gradually get better.     Assessment & Plan:  Influenza-the patient was diagnosed with  influenza. Patient/family educated about the flu and warning signs to watch for. If difficulty breathing,  cyanosis, disorientation, or progressive worsening then immediately get rechecked at the ER. If progressive symptoms be certain to be rechecked. Supportive measures such as Tylenol/ibuprofen was discussed. No aspirin use in children. Tamiflu prescribed

## 2018-05-21 NOTE — Patient Instructions (Signed)

## 2018-05-26 ENCOUNTER — Ambulatory Visit: Payer: 59 | Admitting: Family Medicine

## 2018-05-30 DIAGNOSIS — R200 Anesthesia of skin: Secondary | ICD-10-CM | POA: Diagnosis not present

## 2018-05-30 DIAGNOSIS — H538 Other visual disturbances: Secondary | ICD-10-CM | POA: Diagnosis not present

## 2018-05-30 DIAGNOSIS — G379 Demyelinating disease of central nervous system, unspecified: Secondary | ICD-10-CM | POA: Diagnosis not present

## 2018-08-08 ENCOUNTER — Telehealth: Payer: Self-pay | Admitting: Family Medicine

## 2018-08-08 MED ORDER — PROPRANOLOL HCL ER 80 MG PO CP24: ORAL_CAPSULE | ORAL | 3 refills | Status: DC

## 2018-08-08 MED ORDER — LEVOTHYROXINE SODIUM 100 MCG PO TABS: ORAL_TABLET | ORAL | 3 refills | Status: DC

## 2018-08-08 NOTE — Telephone Encounter (Signed)
May have this +2 refills 

## 2018-08-08 NOTE — Addendum Note (Signed)
Addended by: Dairl Ponder on: 08/08/2018 02:06 PM   Modules accepted: Orders

## 2018-08-08 NOTE — Telephone Encounter (Signed)
Prescriptions sent electronically to pharmacy

## 2018-08-08 NOTE — Telephone Encounter (Signed)
Pharmacy also requesting refill on Levothyroxine 100 mcg tab. Take one tablet by mouth daily before breakfast

## 2018-08-08 NOTE — Telephone Encounter (Signed)
Pharmacy requesting refill on Propranolol ER 80 mg capsules. Take one capsule by mouth every day

## 2018-10-27 ENCOUNTER — Ambulatory Visit: Payer: 59 | Admitting: Family Medicine

## 2018-11-03 ENCOUNTER — Telehealth: Payer: Self-pay | Admitting: Family Medicine

## 2018-11-03 NOTE — Telephone Encounter (Signed)
Pharmacy requesting refill on Citalopram 40 mg tablets. Take one tablet by mouth daily. Pt last seen 05/21/2018 for flu. Please advise. Thank you

## 2018-11-06 MED ORDER — CITALOPRAM HYDROBROMIDE 40 MG PO TABS: 40.0000 mg | ORAL_TABLET | Freq: Every day | ORAL | 0 refills | Status: DC

## 2018-11-06 NOTE — Telephone Encounter (Signed)
May have a 30-day refill needs a virtual follow-up regarding this medication

## 2018-11-06 NOTE — Telephone Encounter (Signed)
Refill sent in with a note to schedule visit

## 2018-11-06 NOTE — Addendum Note (Signed)
Addended by: Vicente Males on: 11/06/2018 08:44 AM   Modules accepted: Orders

## 2018-12-06 ENCOUNTER — Inpatient Hospital Stay (HOSPITAL_COMMUNITY)
Admission: EM | Admit: 2018-12-06 | Discharge: 2018-12-09 | DRG: 060 | Disposition: A | Discharge status: Home / Self Care | Payer: 59 | Attending: Internal Medicine | Admitting: Internal Medicine

## 2018-12-06 ENCOUNTER — Other Ambulatory Visit: Payer: Self-pay

## 2018-12-06 DIAGNOSIS — M5124 Other intervertebral disc displacement, thoracic region: Secondary | ICD-10-CM | POA: Diagnosis present

## 2018-12-06 DIAGNOSIS — I10 Essential (primary) hypertension: Secondary | ICD-10-CM | POA: Diagnosis present

## 2018-12-06 DIAGNOSIS — E890 Postprocedural hypothyroidism: Secondary | ICD-10-CM | POA: Diagnosis present

## 2018-12-06 DIAGNOSIS — F419 Anxiety disorder, unspecified: Secondary | ICD-10-CM | POA: Diagnosis present

## 2018-12-06 DIAGNOSIS — M21372 Foot drop, left foot: Secondary | ICD-10-CM | POA: Diagnosis present

## 2018-12-06 DIAGNOSIS — Z87891 Personal history of nicotine dependence: Secondary | ICD-10-CM

## 2018-12-06 DIAGNOSIS — Z803 Family history of malignant neoplasm of breast: Secondary | ICD-10-CM

## 2018-12-06 DIAGNOSIS — M5441 Lumbago with sciatica, right side: Secondary | ICD-10-CM | POA: Diagnosis present

## 2018-12-06 DIAGNOSIS — G35 Multiple sclerosis: Secondary | ICD-10-CM | POA: Diagnosis not present

## 2018-12-06 DIAGNOSIS — Z20828 Contact with and (suspected) exposure to other viral communicable diseases: Secondary | ICD-10-CM | POA: Diagnosis present

## 2018-12-06 DIAGNOSIS — N3941 Urge incontinence: Secondary | ICD-10-CM | POA: Diagnosis present

## 2018-12-06 DIAGNOSIS — Z885 Allergy status to narcotic agent status: Secondary | ICD-10-CM

## 2018-12-06 DIAGNOSIS — Z79899 Other long term (current) drug therapy: Secondary | ICD-10-CM

## 2018-12-06 DIAGNOSIS — R200 Anesthesia of skin: Secondary | ICD-10-CM | POA: Diagnosis not present

## 2018-12-06 DIAGNOSIS — Z79890 Hormone replacement therapy (postmenopausal): Secondary | ICD-10-CM

## 2018-12-06 DIAGNOSIS — Z882 Allergy status to sulfonamides status: Secondary | ICD-10-CM

## 2018-12-06 DIAGNOSIS — Z808 Family history of malignant neoplasm of other organs or systems: Secondary | ICD-10-CM

## 2018-12-06 DIAGNOSIS — M5442 Lumbago with sciatica, left side: Secondary | ICD-10-CM | POA: Diagnosis present

## 2018-12-06 DIAGNOSIS — Z888 Allergy status to other drugs, medicaments and biological substances status: Secondary | ICD-10-CM

## 2018-12-06 LAB — CBC WITH DIFFERENTIAL/PLATELET
Abs Immature Granulocytes: 0.05 10*3/uL (ref 0.00–0.07)
Basophils Absolute: 0 10*3/uL (ref 0.0–0.1)
Basophils Relative: 0 %
Eosinophils Absolute: 0 10*3/uL (ref 0.0–0.5)
Eosinophils Relative: 0 %
HCT: 37.9 % (ref 36.0–46.0)
Hemoglobin: 11.9 g/dL — ABNORMAL LOW (ref 12.0–15.0)
Immature Granulocytes: 1 %
Lymphocytes Relative: 4 %
Lymphs Abs: 0.5 10*3/uL — ABNORMAL LOW (ref 0.7–4.0)
MCH: 27 pg (ref 26.0–34.0)
MCHC: 31.4 g/dL (ref 30.0–36.0)
MCV: 86.1 fL (ref 80.0–100.0)
Monocytes Absolute: 0.1 10*3/uL (ref 0.1–1.0)
Monocytes Relative: 1 %
Neutro Abs: 10.3 10*3/uL — ABNORMAL HIGH (ref 1.7–7.7)
Neutrophils Relative %: 94 %
Platelets: 241 10*3/uL (ref 150–400)
RBC: 4.4 MIL/uL (ref 3.87–5.11)
RDW: 14.1 % (ref 11.5–15.5)
WBC: 11 10*3/uL — ABNORMAL HIGH (ref 4.0–10.5)
nRBC: 0 % (ref 0.0–0.2)

## 2018-12-06 LAB — URINALYSIS, ROUTINE W REFLEX MICROSCOPIC
Bilirubin Urine: NEGATIVE
Glucose, UA: NEGATIVE mg/dL
Hgb urine dipstick: NEGATIVE
Ketones, ur: 5 mg/dL — AB
Nitrite: NEGATIVE
Protein, ur: NEGATIVE mg/dL
Specific Gravity, Urine: 1.021 (ref 1.005–1.030)
pH: 7 (ref 5.0–8.0)

## 2018-12-06 LAB — PREGNANCY, URINE: Preg Test, Ur: NEGATIVE

## 2018-12-06 MED ORDER — FENTANYL CITRATE (PF) 100 MCG/2ML IJ SOLN
25.0000 ug | Freq: Once | INTRAMUSCULAR | Status: AC
  Administered 2018-12-07: 03:00:00 25 ug via INTRAVENOUS
  Filled 2018-12-06: qty 2

## 2018-12-06 MED ORDER — FENTANYL CITRATE (PF) 100 MCG/2ML IJ SOLN
25.0000 ug | Freq: Once | INTRAMUSCULAR | Status: AC
  Administered 2018-12-06: 25 ug via INTRAVENOUS
  Filled 2018-12-06: qty 2

## 2018-12-06 MED ORDER — ONDANSETRON HCL 4 MG/2ML IJ SOLN
4.0000 mg | Freq: Once | INTRAMUSCULAR | Status: AC
  Administered 2018-12-07: 4 mg via INTRAVENOUS
  Filled 2018-12-06: qty 2

## 2018-12-06 NOTE — ED Provider Notes (Signed)
I saw and evaluated the patient, reviewed the resident's note and I agree with the findings and plan.  Pertinent History: Patient is a well-appearing 38 year old female who presents with back pain that occurred earlier today when she was bending over to pick up her daughter.  She states that approximately an hour and a half after this event while she was hurting and sitting on the couch she felt the need to use the bathroom so she got up to go to the bathroom and lost control of her bladder on the way.  She had a complete evacuation of her bladder at that time.  Since that time the patient has not had any further incontinence.  She does report about 10 days of numbness to the anterior thigh located between the knee and the hip.  This is only on the right side and not associated with any other numbness or weakness.  She had previously been diagnosed with likely multiple sclerosis after having some difficulty dragging her left leg and some visual abnormalities but because there was only a single lesion she did not qualify for any biologic medications and that she has just been dealing with it as her symptoms did improve.  The patient denies any abdominal discomfort fevers chills nausea or vomiting.  She does report that the numbness has been persistent for 10 days, she has not had this further evaluated.  Her back pain today is associated with pain that radiates down the back of both of her legs to her knees.  Pertinent Exam findings: On exam the patient has preserved reflexes at the patellar tendons, she has a area approximately the size of a palm of the right anterior thigh which is numb to the touch.  She is able to move all 4 extremities with normal strength at the major muscle groups including at the ankles to dorsi and plantar flexion, the knees to flexion extension, the hips to flexion and extension as well as the bilateral arms in all movements.  I was personally present and directly supervised the  following procedures:  Medical evaluation  At this time the patient will likely need to have neurologic consultation, she will get pain medication, discussed with neurology, imaging as per their recommendation.   Final diagnoses:  Acute midline low back pain with bilateral sciatica      Lisa Chapel, MD 12/07/18 1456

## 2018-12-06 NOTE — ED Provider Notes (Signed)
[facility] EMERGENCY DEPARTMENT Provider Note   CSN: [id number] Arrival date & time: 12/06/18  1857     History   Chief Complaint Chief Complaint  [name] presents with  . Numbness    HPI Lisa [name] a 38 y.o. female with possible MS presents to [facility] ED complaining of right thigh numbness for Lisa past 10 days.  [name] [name] complaining of lower back pain that started acutely today when Lisa [name] bent over to pick up Lisa [name] 38 year old.  Lisa [name] endorses an episode of urinary incontinence today.  [name] reports Lisa [name] was sitting on Lisa couch and felt Lisa urge to urinate and reports Lisa [name] got up to walk to Lisa bathroom and lost control of Lisa [name] bladder before making it there.  [name] was seen here in January for left leg and left hand numbness at which time Lisa [name] had MRIs of Lisa brain, C-spine, and T-spine which showed one lesion in Lisa parietal lobe but no spinal cord lesions.  [name] was seen by a neurologist who wanted to start Lisa [name] on a biologic medication for possible MS, but [name] states Lisa [name] insurance would not cover it because Lisa [name] only had 1 lesion.  Lisa [name] reports that Lisa [name] pain today [name] located in Lisa lower lumbar spine and radiates down Lisa back of both legs to Lisa [name] knees.  Lisa [name] describes Lisa pain as sharp and severe and rates it as an 8 out of 10.  Lisa [name] denies any fever, chills, focal weakness, shortness of breath, cough, chest pain, abdominal pain, nausea, vomiting, diarrhea, or any other complaints.     Lisa history [name] provided by Lisa [name].    Past Medical History:  Diagnosis Date  . Anxiety   . Headache(784.0)   . Hypertension   . Neuromuscular disorder ([name])    sus  . PONV (postoperative nausea and vomiting)   . Thyroid nodule     [name] Active Problem List   Diagnosis Date Noted  . History of numbness 04/29/2018  . Numbness in left leg 04/29/2018  . Gastroesophageal reflux disease without esophagitis 07/02/2015  . Hypothyroidism 10/24/2014  .  Morbid obesity ([name]) 10/21/2014  . Thyroid activity decreased 07/16/2014  . Migraine without aura and without status migrainosus, not intractable 07/16/2014  . Anxiety 10/01/2012  . Thyroid nodule 06/18/2011    Past Surgical History:  Procedure Laterality Date  . ADENOIDECTOMY    . CESAREAN SECTION    . CESAREAN SECTION    . THYROIDECTOMY  06/18/2011   Procedure: THYROIDECTOMY;  Surgeon: Melida Quitter, MD;  Location: [facility];  Service: ENT;  Laterality: Right;  . TONSILLECTOMY    . TYMPANOPLASTY       OB History    Gravida  2   Para  2   Term  2   Preterm      AB      Living  2     SAB      TAB      Ectopic      Multiple      Live Births  2            Home Medications    Prior to Admission medications   Medication Sig Start Date End Date Taking? Authorizing Provider  citalopram (CELEXA) 40 MG tablet Take 1 tablet (40 mg total) by mouth daily. 11/06/18  Yes Kathyrn Drown, MD  levothyroxine (SYNTHROID) 100 MCG tablet TAKE 1 TABLET BY MOUTH DAILY BEFORE BREAKFAST [name] taking  differently: Take 100 mcg by mouth daily.  08/08/18  Yes Luking, Elayne Snare, MD  propranolol ER (INDERAL LA) 80 MG 24 hr capsule TAKE 1 CAPSULE BY MOUTH EVERY DAY [name] taking differently: Take 80 mg by mouth daily.  08/08/18  Yes Kathyrn Drown, MD    Family History No family history on file.  Social History Social History   Tobacco Use  . Smoking status: Former Research scientist (life sciences)  . Smokeless tobacco: Never Used  Substance Use Topics  . Alcohol use: Yes    Comment: 1/month  . Drug use: No     Allergies   Sulfa antibiotics, Darvocet [propoxyphene n-acetaminophen], Cephalexin, Imitrex [sumatriptan base], and Penicillins   Review of Systems Review of Systems  Constitutional: Negative for chills and fever.  HENT: Negative for ear pain and sore throat.   Eyes: Negative for pain and visual disturbance.  Respiratory: Negative for cough and shortness of breath.   Cardiovascular:  Negative for chest pain and palpitations.  Gastrointestinal: Negative for abdominal pain and vomiting.  Genitourinary: Negative for dysuria and hematuria.       Positive for urinary incontinence  Musculoskeletal: Positive for back pain. Negative for arthralgias.  Skin: Negative for color change and rash.  Neurological: Positive for numbness. Negative for dizziness, seizures, syncope and facial asymmetry.  All other systems reviewed and are negative.    Physical Exam Updated Vital Signs BP 97/80   Pulse 68   Temp 99.7 F (37.6 C) (Oral)   Ht 5\' 10"  (1.778 m)   Wt 113.4 kg   LMP 11/22/2018 (Exact Date)   SpO2 95%   BMI 35.87 kg/m   Physical Exam Vitals signs and nursing note reviewed.  Constitutional:      General: Lisa [name] not in acute distress.    Appearance: Normal appearance. Lisa [name] not ill-appearing, toxic-appearing or diaphoretic.  HENT:     Head: Normocephalic and atraumatic.     Nose: Nose normal. No congestion or rhinorrhea.     Mouth/Throat:     Mouth: Mucous membranes are moist.     Pharynx: Oropharynx [name] clear. No oropharyngeal exudate or posterior oropharyngeal erythema.  Eyes:     Extraocular Movements: Extraocular movements intact.     Conjunctiva/sclera: Conjunctivae normal.     Pupils: Pupils are equal, round, and reactive to light.  Neck:     Musculoskeletal: Normal range of motion and neck supple. No neck rigidity or muscular tenderness.  Cardiovascular:     Rate and Rhythm: Normal rate and regular rhythm.     Pulses: Normal pulses.     Heart sounds: Normal heart sounds. No murmur. No friction rub. No gallop.   Pulmonary:     Effort: Pulmonary effort [name] normal. No respiratory distress.     Breath sounds: Normal breath sounds. No stridor. No wheezing, rhonchi or rales.  Chest:     Chest wall: No tenderness.  Abdominal:     General: Abdomen [name] flat. There [name] no distension.     Palpations: Abdomen [name] soft.     Tenderness: There [name] no abdominal  tenderness. There [name] no guarding or rebound.  Musculoskeletal: Normal range of motion.        General: Tenderness (diffuse tenderness to lumbar spine) present. No swelling, deformity or signs of injury.  Skin:    General: Skin [name] warm and dry.  Neurological:     General: No focal deficit present.     Mental Status: Lisa [name] alert and oriented to person, place,  and time. Mental status [name] at baseline.     Cranial Nerves: No cranial nerve deficit.     Sensory: Sensory deficit (decreased sensation to light touch of anterior right thigh) present.     Motor: No weakness.     Coordination: Coordination normal.     Gait: Gait normal.     Deep Tendon Reflexes: Reflexes normal.      ED Treatments / Results  Labs (all labs ordered are listed, but only abnormal results are displayed) Labs Reviewed  URINALYSIS, ROUTINE W REFLEX MICROSCOPIC - Abnormal; Notable for Lisa following components:      Result Value   APPearance HAZY (*)    Ketones, ur 5 (*)    Leukocytes,Ua MODERATE (*)    Bacteria, UA RARE (*)    All other components within normal limits  BASIC METABOLIC PANEL - Abnormal; Notable for Lisa following components:   CO2 20 (*)    Glucose, Bld 150 (*)    All other components within normal limits  CBC WITH DIFFERENTIAL/PLATELET - Abnormal; Notable for Lisa following components:   WBC 11.0 (*)    Hemoglobin 11.9 (*)    Neutro Abs 10.3 (*)    Lymphs Abs 0.5 (*)    All other components within normal limits  URINE CULTURE  PREGNANCY, URINE    EKG None  Radiology No results found.  Procedures Procedures (including critical care time)  Medications Ordered in ED Medications  ondansetron (ZOFRAN) injection 4 mg (has no administration in time range)  fentaNYL (SUBLIMAZE) injection 25 mcg (has no administration in time range)  fentaNYL (SUBLIMAZE) injection 25 mcg (25 mcg Intravenous Given 12/06/18 2033)     Initial Impression / Assessment and Plan / ED Course  I have reviewed Lisa  triage vital signs and Lisa nursing notes.  Pertinent labs & imaging results that were available during my care of Lisa [name] were reviewed by me and considered in my medical decision making (see chart for details).        Delcie Ziff Gregori [name] a 38 y.o. female with possible MS presents to Lisa ED complaining of right thigh numbness for Lisa past 10 days as well as lower back pain and urinary incontinence today since picking up Lisa [name] 38 year old.  [name] has no neurologic deficits on exam other than an area of numbness to Lisa anterior right thigh.  Discussed case with neurology, Dr. Leonel Ramsay, who recommended obtaining MRIs of Lisa thoracic and lumbar spine.  He felt that [name] stable for discharge and outpatient follow-up there [name] no surgical issue found on MRI.  [name] was signed out to Dr. Stark Jock at 0000.  At time of signout, MRI of T and L-spine are pending.  Final Clinical Impressions(s) / ED Diagnoses   Final diagnoses:  Acute midline low back pain with bilateral sciatica    ED Discharge Orders    None       Candie Chroman, MD 12/07/18 Leandrew Koyanagi    Noemi Chapel, MD 12/07/18 [phone]

## 2018-12-06 NOTE — ED Triage Notes (Signed)
Pt came in today complaining of R thigh numbness about 10 days ago. Also notes lower back pain and pain in legs bilaterally since this morning (began when she picked up child and felt a shooting pain). Loss of bladder control noted today as well. VSS, pain 8/10.

## 2018-12-07 ENCOUNTER — Emergency Department (HOSPITAL_COMMUNITY): Payer: 59

## 2018-12-07 DIAGNOSIS — G379 Demyelinating disease of central nervous system, unspecified: Secondary | ICD-10-CM | POA: Diagnosis not present

## 2018-12-07 DIAGNOSIS — M5442 Lumbago with sciatica, left side: Secondary | ICD-10-CM | POA: Diagnosis present

## 2018-12-07 DIAGNOSIS — N3941 Urge incontinence: Secondary | ICD-10-CM

## 2018-12-07 DIAGNOSIS — E890 Postprocedural hypothyroidism: Secondary | ICD-10-CM | POA: Diagnosis present

## 2018-12-07 DIAGNOSIS — Z20828 Contact with and (suspected) exposure to other viral communicable diseases: Secondary | ICD-10-CM | POA: Diagnosis present

## 2018-12-07 DIAGNOSIS — M21372 Foot drop, left foot: Secondary | ICD-10-CM | POA: Diagnosis present

## 2018-12-07 DIAGNOSIS — F419 Anxiety disorder, unspecified: Secondary | ICD-10-CM | POA: Diagnosis present

## 2018-12-07 DIAGNOSIS — Z808 Family history of malignant neoplasm of other organs or systems: Secondary | ICD-10-CM | POA: Diagnosis not present

## 2018-12-07 DIAGNOSIS — I10 Essential (primary) hypertension: Secondary | ICD-10-CM | POA: Diagnosis present

## 2018-12-07 DIAGNOSIS — R200 Anesthesia of skin: Secondary | ICD-10-CM | POA: Diagnosis present

## 2018-12-07 DIAGNOSIS — Z885 Allergy status to narcotic agent status: Secondary | ICD-10-CM | POA: Diagnosis not present

## 2018-12-07 DIAGNOSIS — M5020 Other cervical disc displacement, unspecified cervical region: Secondary | ICD-10-CM | POA: Diagnosis not present

## 2018-12-07 DIAGNOSIS — Z79899 Other long term (current) drug therapy: Secondary | ICD-10-CM | POA: Diagnosis not present

## 2018-12-07 DIAGNOSIS — G35 Multiple sclerosis: Secondary | ICD-10-CM | POA: Diagnosis present

## 2018-12-07 DIAGNOSIS — Z8619 Personal history of other infectious and parasitic diseases: Secondary | ICD-10-CM

## 2018-12-07 DIAGNOSIS — Z882 Allergy status to sulfonamides status: Secondary | ICD-10-CM | POA: Diagnosis not present

## 2018-12-07 DIAGNOSIS — M5124 Other intervertebral disc displacement, thoracic region: Secondary | ICD-10-CM | POA: Diagnosis present

## 2018-12-07 DIAGNOSIS — Z803 Family history of malignant neoplasm of breast: Secondary | ICD-10-CM | POA: Diagnosis not present

## 2018-12-07 DIAGNOSIS — M5441 Lumbago with sciatica, right side: Secondary | ICD-10-CM | POA: Diagnosis present

## 2018-12-07 DIAGNOSIS — Z888 Allergy status to other drugs, medicaments and biological substances status: Secondary | ICD-10-CM | POA: Diagnosis not present

## 2018-12-07 DIAGNOSIS — Z87891 Personal history of nicotine dependence: Secondary | ICD-10-CM | POA: Diagnosis not present

## 2018-12-07 DIAGNOSIS — Z79890 Hormone replacement therapy (postmenopausal): Secondary | ICD-10-CM | POA: Diagnosis not present

## 2018-12-07 LAB — BASIC METABOLIC PANEL
Anion gap: 7 (ref 5–15)
BUN: 12 mg/dL (ref 6–20)
CO2: 20 mmol/L — ABNORMAL LOW (ref 22–32)
Calcium: 9.2 mg/dL (ref 8.9–10.3)
Chloride: 109 mmol/L (ref 98–111)
Creatinine, Ser: 0.68 mg/dL (ref 0.44–1.00)
GFR calc Af Amer: >60 mL/min (ref >60)
GFR calc non Af Amer: >60 mL/min (ref >60)
Glucose, Bld: 150 mg/dL — ABNORMAL HIGH (ref 70–99)
Potassium: 4.7 mmol/L (ref 3.5–5.1)
Sodium: 136 mmol/L (ref 135–145)

## 2018-12-07 LAB — HEMOGLOBIN A1C
Hgb A1c MFr Bld: 5.3 % (ref 4.8–5.6)
Mean Plasma Glucose: 105.41 mg/dL

## 2018-12-07 LAB — GLUCOSE, CAPILLARY
Glucose-Capillary: 176 mg/dL — ABNORMAL HIGH (ref 70–99)
Glucose-Capillary: 217 mg/dL — ABNORMAL HIGH (ref 70–99)

## 2018-12-07 LAB — TSH: TSH: 1.405 u[IU]/mL (ref 0.350–4.500)

## 2018-12-07 LAB — SARS CORONAVIRUS 2 (TAT 6-24 HRS): SARS Coronavirus 2: NEGATIVE

## 2018-12-07 MED ORDER — ONDANSETRON HCL 4 MG PO TABS: 4.0000 mg | ORAL_TABLET | Freq: Four times a day (QID) | ORAL | Status: DC | PRN

## 2018-12-07 MED ORDER — GADOBUTROL 1 MMOL/ML IV SOLN
10.0000 mL | Freq: Once | INTRAVENOUS | Status: AC | PRN
  Administered 2018-12-07: 06:00:00 10 mL via INTRAVENOUS

## 2018-12-07 MED ORDER — PROPRANOLOL HCL ER 80 MG PO CP24
80.0000 mg | ORAL_CAPSULE | Freq: Every day | ORAL | Status: DC
  Administered 2018-12-07 – 2018-12-09 (×3): 80 mg via ORAL
  Filled 2018-12-07 (×3): qty 1

## 2018-12-07 MED ORDER — ACETAMINOPHEN 650 MG RE SUPP: 650.0000 mg | Freq: Four times a day (QID) | RECTAL | Status: DC | PRN

## 2018-12-07 MED ORDER — CITALOPRAM HYDROBROMIDE 40 MG PO TABS
40.0000 mg | ORAL_TABLET | Freq: Every day | ORAL | Status: DC
  Administered 2018-12-07 – 2018-12-09 (×3): 40 mg via ORAL
  Filled 2018-12-07: qty 4
  Filled 2018-12-07 (×2): qty 1

## 2018-12-07 MED ORDER — ACETAMINOPHEN 325 MG PO TABS: 650.0000 mg | ORAL_TABLET | Freq: Four times a day (QID) | ORAL | Status: DC | PRN

## 2018-12-07 MED ORDER — GADOBUTROL 1 MMOL/ML IV SOLN: 10.0000 mL | Freq: Once | INTRAVENOUS | Status: DC | PRN

## 2018-12-07 MED ORDER — SODIUM CHLORIDE 0.9 % IV SOLN
1000.0000 mg | Freq: Once | INTRAVENOUS | Status: DC
  Filled 2018-12-07: qty 8

## 2018-12-07 MED ORDER — INSULIN ASPART 100 UNIT/ML ~~LOC~~ SOLN
0.0000 [IU] | Freq: Three times a day (TID) | SUBCUTANEOUS | Status: DC
  Administered 2018-12-07: 2 [IU] via SUBCUTANEOUS

## 2018-12-07 MED ORDER — SODIUM CHLORIDE 0.9 % IV SOLN
1000.0000 mg | Freq: Every day | INTRAVENOUS | Status: DC
  Administered 2018-12-07: 1000 mg via INTRAVENOUS
  Filled 2018-12-07: qty 8

## 2018-12-07 MED ORDER — SODIUM CHLORIDE 0.9 % IV SOLN
1000.0000 mg | Freq: Every day | INTRAVENOUS | Status: AC
  Administered 2018-12-08 – 2018-12-09 (×2): 1000 mg via INTRAVENOUS
  Filled 2018-12-07 (×2): qty 8

## 2018-12-07 MED ORDER — FENTANYL CITRATE (PF) 100 MCG/2ML IJ SOLN: 50.0000 ug | Freq: Once | INTRAMUSCULAR | Status: DC

## 2018-12-07 MED ORDER — INSULIN ASPART 100 UNIT/ML ~~LOC~~ SOLN
0.0000 [IU] | Freq: Every day | SUBCUTANEOUS | Status: DC
  Administered 2018-12-07: 2 [IU] via SUBCUTANEOUS

## 2018-12-07 MED ORDER — ONDANSETRON HCL 4 MG/2ML IJ SOLN: 4.0000 mg | Freq: Four times a day (QID) | INTRAMUSCULAR | Status: DC | PRN

## 2018-12-07 MED ORDER — FENTANYL CITRATE (PF) 100 MCG/2ML IJ SOLN
25.0000 ug | Freq: Once | INTRAMUSCULAR | Status: AC
  Administered 2018-12-07: 25 ug via INTRAVENOUS
  Filled 2018-12-07: qty 2

## 2018-12-07 MED ORDER — ENOXAPARIN SODIUM 40 MG/0.4ML ~~LOC~~ SOLN
40.0000 mg | SUBCUTANEOUS | Status: DC
  Administered 2018-12-07 – 2018-12-09 (×3): 40 mg via SUBCUTANEOUS
  Filled 2018-12-07 (×4): qty 0.4

## 2018-12-07 MED ORDER — LEVOTHYROXINE SODIUM 100 MCG PO TABS
100.0000 ug | ORAL_TABLET | Freq: Every day | ORAL | Status: DC
  Administered 2018-12-08 – 2018-12-09 (×2): 100 ug via ORAL
  Filled 2018-12-07 (×2): qty 1

## 2018-12-07 MED ORDER — PANTOPRAZOLE SODIUM 40 MG PO TBEC
40.0000 mg | DELAYED_RELEASE_TABLET | Freq: Every day | ORAL | Status: DC
  Administered 2018-12-07 – 2018-12-09 (×3): 40 mg via ORAL
  Filled 2018-12-07 (×3): qty 1

## 2018-12-07 NOTE — H&P (Signed)
Date: 12/07/2018               Patient Name:  Lisa [name] MRN: [id number]  DOB: Dec 21, 1980 Age / Sex: 38 y.o., female   PCP: Kathyrn Drown, MD         Medical Service: Internal Medicine Teaching Service         Attending Physician: Dr. Veryl Speak, MD    First Contact: Dr. Gilford Rile Pager: [phone]  Second Contact: Dr. Tarri Abernethy Pager: [phone]       After Hours (After 5p/  First Contact Pager: [phone]  weekends / holidays): Second Contact Pager: [phone]   Chief Complaint: Back pain  History of Present Illness:  Lisa [name] is a 38 y.o female with a PMH of anxiety, HTN, and recurrent neuological deficits who presented to the ED with 2 days of back pain with radicular symptoms. History was obtained via the patient and through chart review.  Lisa [name]'s symptoms began yesterday when she picked up her 20 y/o child and felt a constant ache in her lumbar spine with bilateral radicular symptoms down her posterior legs and ending at her mid calf. She states that her initial pain was 9-10/10 on the pain scale, was "Sharp and shooty" in character, and comes and goes in duration. She states that her pain rates at a 7/10 now. She has associated right leg numbness/weakness for the last 10 days. With these symptoms she also endorses urinary urge incontinence, but states she has had this with her past neurological symptoms.   In January she had left leg weakness with foot drop and left hand weakness. She came to the ED and got an MRI brain, cervical, and thoracic spine that did not show any new demyelinating lesion but did show a new herniated disc in her cervical spine. She was evaluated by neurology and recommended discharge with outpatient follow-up. Over the past 10-12 years she has had multiple episodes of neurological symptoms (recurrent hand/leg weakness/numbness and blurry vision). Her first MRI in 2013 illustrated a right parietal lobe lesion consistent with  demyelination that has since remained stable. She follows with neurology (Dr. Nestor Ramp at [facility]) and was supposed to start MS medication but unfortunately insurance would not pay for the medication. With COVID she has not been back to see her neurologist since that time.   In addition to the above history she has had recurrent shingles over the past 10 years. She has had PCR testing that returned positive for varicella. She does state that she has noticed in the past numbness over her shingles rash and that she's been told her pattern is not a typical shingles pattern. She denies vomiting, nausea, myalgias, vision changes, chest pain, constipation, and diarrhea.    Meds:  Current Meds  Medication Sig   citalopram (CELEXA) 40 MG tablet Take 1 tablet (40 mg total) by mouth daily.   levothyroxine (SYNTHROID) 100 MCG tablet TAKE 1 TABLET BY MOUTH DAILY BEFORE BREAKFAST (Patient taking differently: Take 100 mcg by mouth daily. )   propranolol ER (INDERAL LA) 80 MG 24 hr capsule TAKE 1 CAPSULE BY MOUTH EVERY DAY (Patient taking differently: Take 80 mg by mouth daily. )   Allergies: Allergies as of 12/06/2018 - Review Complete 12/06/2018  Allergen Reaction Noted   Sulfa antibiotics Anaphylaxis 08/20/2012   Darvocet [propoxyphene n-acetaminophen] Other (See Comments) 06/18/2011   Cephalexin Rash 02/25/2011   Imitrex [sumatriptan base] Other (See Comments) 06/18/2011   Penicillins Rash and Other (  See Comments) 02/25/2011   Past Medical History:  Diagnosis Date   Anxiety    Headache(784.0)    Hypertension    Neuromuscular disorder ([place])    sus   PONV (postoperative nausea and vomiting)    Thyroid nodule    Family History:  - + for brain cancer and breast cancer.   Social History:  - Married with 4 children - Her diet consists of home cooked meals.  - She denies tobacco, EtOH, or illicit drug use.  Review of Systems: A complete ROS was negative except as per HPI.    Physical Exam: Blood pressure 103/70, pulse 63, temperature 99.7 F (37.6 C), temperature source Oral, resp. rate 18, height 5\' 10"  (1.778 m), weight 113.4 kg, last menstrual period 11/22/2018, SpO2 99 %. Physical Exam Vitals signs and nursing note reviewed.  Constitutional:      General: She is not in acute distress.    Appearance: She is obese. She is not ill-appearing, toxic-appearing or diaphoretic.  HENT:     Head: Normocephalic and atraumatic.  Eyes:     General:        Right eye: No discharge.        Left eye: No discharge.     Extraocular Movements: Extraocular movements intact.     Pupils: Pupils are equal, round, and reactive to light.  Cardiovascular:     Rate and Rhythm: Normal rate and regular rhythm.     Pulses: Normal pulses.     Heart sounds: Normal heart sounds. No murmur. No gallop.   Pulmonary:     Effort: Pulmonary effort is normal.     Breath sounds: Normal breath sounds. No stridor. No wheezing or rales.  Abdominal:     General: Abdomen is flat. Bowel sounds are normal.     Palpations: Abdomen is soft. There is no mass.     Tenderness: There is no abdominal tenderness. There is no guarding or rebound.  Neurological:     Mental Status: She is alert and oriented to person, place, and time.     Cranial Nerves: No cranial nerve deficit.     Comments: Sensation intact in the upper extremities bilaterally.  She could not differentiate soft and sharp touch of the R deep peroneal territory. She states she could only feel "pressure" on the R upper thight.  CN II-XII grossly intact.       MRI THORACIC SPINE IMPRESSION: 1. Patchy multifocal cord signal abnormality involving the lower thoracic spine as above, suspicious for demyelinating disease, multiple sclerosis. Subtle patchy enhancement about a lesion involving the right posterior cord at the level of T11-12 consistent with active demyelination. 2. Minor multilevel degenerative spondylolysis without  significant stenosis or neural impingement.  MRI LUMBAR SPINE IMPRESSION: 1. No acute abnormality within the lumbar spine. 2. Mild noncompressive disc bulging at L1-2 through L3-4 without significant stenosis or impingement. 3. Mild bilateral facet hypertrophy at L2-3 through L4-5, which could contribute to lower back pain. 4. Transitional lumbosacral anatomy.  Assessment & Plan by Problem: Active Problems:   * No active hospital problems. *  Mrs. Linsy Baldassari is a 38 y/o female that presents to [facility] with an [place]. Her symptoms are concerning for a recent flair of her multiple sclerosis, per her MRI of the thoracic spine showing active demyelination. This may explain her new onset numbness in the L2 and L4-5 nerve distributions as well as her sudden urge incontinence. Her new onset of back pain may be due  to her demyelination, or is 2/2 to the bilateral facet hypertrophy at L2-L3 through L4-L5.  - Neurology was consulted, we appreciate their help with Ms. Magos's care.   - Solumedrol 1G QDx3  - Protonix 40 mg QD  - F/U outpatient neurology - SSI for increase sugar 2/2 to solumedrol dosing.   Anxiety:  - Ordered Citalopram 40 mg QD.  Hypertension:  - Ordered Propranolol 24 hr 80 mg QD.    CSF in 2013 without oligoclonal bands and IgG index was normal.   Dispo: Admit patient to Inpatient with expected length of stay greater than 2 midnights.  Signed: Maudie Mercury MD 12/07/2018, 8:15 AM

## 2018-12-07 NOTE — ED Notes (Signed)
ED TO INPATIENT HANDOFF REPORT  ED Nurse Name and Phone #: Babs Bertin [phone]  S Name/Age/Gender Lisa [name] 38 y.o. female Room/Bed: 014C/014C  Code Status   Code Status: Full Code  Home/SNF/Other    Triage Complete: Triage complete  Chief Complaint back pain and loss of bladder control  Triage Note Pt came in today complaining of R thigh numbness about 10 days ago. Also notes lower back pain and pain in legs bilaterally since this morning (began when she picked up child and felt a shooting pain). Loss of bladder control noted today as well. VSS, pain 8/10.    Allergies Allergies  Allergen Reactions  . Sulfa Antibiotics Anaphylaxis  . Darvocet [Propoxyphene N-Acetaminophen] Other (See Comments)    Reaction:  Hallucinations   . Cephalexin Rash  . Imitrex [Sumatriptan Base] Other (See Comments)    Reaction:  Makes pts jaw lock-up  . Penicillins Rash and Other (See Comments)    Has patient had a PCN reaction causing immediate rash, facial/tongue/throat swelling, SOB or lightheadedness with hypotension: No Has patient had a PCN reaction causing severe rash involving mucus membranes or skin necrosis: No Has patient had a PCN reaction that required hospitalization: No Has patient had a PCN reaction occurring within the last 10 years: No If all of the above answers are "NO", then may proceed with Cephalosporin use.    Level of Care/Admitting Diagnosis ED Disposition    ED Disposition Condition [facility] Area: [place] [[id number]]  Level of Care: Progressive [102]  Covid Evaluation: Asymptomatic Screening Protocol (No Symptoms)  Diagnosis: Demyelinating lesion [facility] [place] [id number]  Admitting Physician: Oda Kilts [id number]  Attending Physician: Oda Kilts [id number]  Estimated length of stay: 5 - 7 days  Certification:: I certify this patient will need inpatient services for at least 2 midnights  Bed request  comments: Neuro floor  PT Class (Do Not Modify): Inpatient [101]  PT Acc Code (Do Not Modify): Private [1]       B Medical/Surgery History Past Medical History:  Diagnosis Date  . Anxiety   . Headache(784.0)   . Hypertension   . Neuromuscular disorder ([place])    sus  . PONV (postoperative nausea and vomiting)   . Thyroid nodule    Past Surgical History:  Procedure Laterality Date  . ADENOIDECTOMY    . CESAREAN SECTION    . CESAREAN SECTION    . THYROIDECTOMY  06/18/2011   Procedure: THYROIDECTOMY;  Surgeon: Melida Quitter, MD;  Location: [place];  Service: ENT;  Laterality: Right;  . TONSILLECTOMY    . TYMPANOPLASTY       A IV Location/Drains/Wounds Patient Lines/Drains/Airways Status   Active Line/Drains/Airways    Name:   Placement date:   Placement time:   Site:   Days:   Peripheral IV 12/06/18 Right Hand   12/06/18    2032    Hand   1   Closed System Drain 1 Left Neck Bulb (JP) 7 Fr.   06/18/11    1004    Neck   2729   Incision 06/18/11 Neck Other (Comment)   06/18/11    1024     2729          Intake/Output Last 24 hours No intake or output data in the 24 hours ending 12/07/18 1151  Labs/Imaging Results for orders placed or performed during the hospital encounter of 12/06/18 (from the past 48 hour(s))  Basic metabolic panel  Status: Abnormal   Collection Time: 12/06/18 11:31 PM  Result Value Ref Range   Sodium 136 135 - 145 mmol/L   Potassium 4.7 3.5 - 5.1 mmol/L   Chloride 109 98 - 111 mmol/L   CO2 20 (L) 22 - 32 mmol/L   Glucose, Bld 150 (H) 70 - 99 mg/dL   BUN 12 6 - 20 mg/dL   Creatinine, Ser 0.68 0.44 - 1.00 mg/dL   Calcium 9.2 8.9 - 10.3 mg/dL   GFR calc non Af Amer >60 >60 mL/min   GFR calc Af Amer >60 >60 mL/min   Anion gap 7 5 - 15    Comment: Performed at [facility] [address], [place], [place] [address]  CBC with Differential/Platelet     Status: Abnormal   Collection Time: 12/06/18 11:31 PM  Result Value Ref Range   WBC 11.0  (H) 4.0 - 10.5 K/uL   RBC 4.40 3.87 - 5.11 MIL/uL   Hemoglobin 11.9 (L) 12.0 - 15.0 g/dL   HCT 37.9 36.0 - 46.0 %   MCV 86.1 80.0 - 100.0 fL   MCH 27.0 26.0 - 34.0 pg   MCHC 31.4 30.0 - 36.0 g/dL   RDW 14.1 11.5 - 15.5 %   Platelets 241 150 - 400 K/uL   nRBC 0.0 0.0 - 0.2 %   Neutrophils Relative % 94 %   Neutro Abs 10.3 (H) 1.7 - 7.7 K/uL   Lymphocytes Relative 4 %   Lymphs Abs 0.5 (L) 0.7 - 4.0 K/uL   Monocytes Relative 1 %   Monocytes Absolute 0.1 0.1 - 1.0 K/uL   Eosinophils Relative 0 %   Eosinophils Absolute 0.0 0.0 - 0.5 K/uL   Basophils Relative 0 %   Basophils Absolute 0.0 0.0 - 0.1 K/uL   Immature Granulocytes 1 %   Abs Immature Granulocytes 0.05 0.00 - 0.07 K/uL    Comment: Performed at [facility], [place] [address], [place], [place] [address]  Urinalysis, Routine w reflex microscopic     Status: Abnormal   Collection Time: 12/06/18 11:43 PM  Result Value Ref Range   Color, Urine YELLOW YELLOW   APPearance HAZY (A) CLEAR   Specific Gravity, Urine 1.021 1.005 - 1.030   pH 7.0 5.0 - 8.0   Glucose, UA NEGATIVE NEGATIVE mg/dL   Hgb urine dipstick NEGATIVE NEGATIVE   Bilirubin Urine NEGATIVE NEGATIVE   Ketones, ur 5 (A) NEGATIVE mg/dL   Protein, ur NEGATIVE NEGATIVE mg/dL   Nitrite NEGATIVE NEGATIVE   Leukocytes,Ua MODERATE (A) NEGATIVE   RBC / HPF 0-5 0 - 5 RBC/hpf   WBC, UA 6-10 0 - 5 WBC/hpf   Bacteria, UA RARE (A) NONE SEEN   Squamous Epithelial / LPF 21-50 0 - 5   Mucus PRESENT     Comment: Performed at [facility], [place] [address] [place], [place] [address]  Pregnancy, urine     Status: None   Collection Time: 12/06/18 11:44 PM  Result Value Ref Range   Preg Test, Ur NEGATIVE NEGATIVE    Comment:        THE SENSITIVITY OF THIS METHODOLOGY IS >20 mIU/mL. Performed at [facility], [place] [address], [place], [place] [address]   TSH     Status: None   Collection Time: 12/07/18  9:11 AM  Result Value Ref Range   TSH 1.405 0.350 - 4.500  uIU/mL    Comment: Performed by a 3rd Generation assay  with a functional sensitivity of <=0.01 uIU/mL. Performed at [facility], [place] [address] [place], [place] [address]    Mr Thoracic Spine W Wo Contrast  Result Date: 12/07/2018 CLINICAL DATA:  Initial evaluation for right thigh numbness for 10 days, low back pain. History of probable multiple sclerosis. EXAM: MRI THORACIC AND LUMBAR SPINE WITHOUT AND WITH CONTRAST TECHNIQUE: Multiplanar and multiecho pulse sequences of the thoracic and lumbar spine were obtained without and with intravenous contrast. CONTRAST:  10 cc of Gadavist. COMPARISON:  Prior brain MRI from 05/02/2018. FINDINGS: MRI THORACIC SPINE FINDINGS Alignment: Vertebral bodies normally aligned with preservation of the normal thoracic kyphosis. No listhesis or malalignment. Vertebrae: Vertebral body height maintained without evidence for acute or chronic fracture. Bone marrow signal intensity within normal limits. Subcentimeter benign hemangioma noted within the L2 vertebral body. No other discrete or worrisome osseous lesions. No abnormal marrow edema or enhancement. Cord: Mild prominence of the central canal at the level of T9 without frank syrinx formation. Patchy signal abnormality within the left hemi cord at the level of T8-9 (series 18, image 26). Patchy signal abnormality within the central and dorsal aspect of the cord at the level of T10-11 (series 18, image 31). Additional signal abnormality within the left posterior cord at the level of T10-11 (series 18, image 32). Additional signal abnormality within the right posterior cord at the level of T11-12 (a series 18, image 34). There is suggestion of faint patchy enhancement about this lesion involving the right hemi cord at the level of T11-12, suspicious for possible active demyelination (series 21, image 9 on sagittal sequence, series 22, image 34 on axial sequence). No other definite abnormal enhancement to suggest active  demyelination. Paraspinal and other soft tissues: Paraspinous soft tissues within normal limits. Partially visualized lungs are clear. Single approximate 1 cm T2 hyperintense simple cyst noted within the left kidney. Visualized visceral structures otherwise unremarkable. Disc levels: Minimal scattered degenerative disc bulging noted at T4-5, T6-7, T9-10, T10-11, and T12-L1. No focal disc herniation. No significant canal or foraminal stenosis. MRI LUMBAR SPINE FINDINGS Segmentation: Transitional lumbosacral anatomy with partial sacralization of the L5 vertebral body. L5-S1 disc somewhat rudimentary. Alignment: Physiologic with preservation of the normal lumbar lordosis. No listhesis. Vertebrae: Vertebral body height maintained without evidence for acute or chronic fracture. Bone marrow signal intensity within normal limits. Few scattered subcentimeter benign hemangiomas noted. No other discrete or worrisome osseous lesions. No abnormal marrow edema or enhancement. Conus medullaris: Extends to the L1 level and appears normal. Paraspinal and other soft tissues: Paraspinous soft tissues within normal limits. Visualized visceral structures unremarkable. Disc levels: L1-2: Mild annular disc bulge. No canal or foraminal stenosis. No impingement. L2-3: Mild annular disc bulge. Mild bilateral facet hypertrophy. No canal or foraminal stenosis. No impingement. L3-4: Mild circumferential disc bulge. Mild facet hypertrophy. No canal or neural foraminal stenosis. No impingement. L4-5: Negative interspace. Mild facet hypertrophy. No canal or neural foraminal stenosis. No impingement. L5-S1: Transitional lumbosacral anatomy with partial sacralization of the L5 vertebral body. L5-S1 disc somewhat rudimentary. No disc bulge or disc protrusion. Middle facet degeneration. No canal or foraminal stenosis. No impingement. IMPRESSION: MRI THORACIC SPINE IMPRESSION: 1. Patchy multifocal cord signal abnormality involving the lower thoracic  spine as above, suspicious for demyelinating disease, multiple sclerosis. Subtle patchy enhancement about a lesion involving the right posterior cord at the level of T11-12 consistent with active demyelination. 2. Minor multilevel degenerative spondylolysis without significant stenosis or neural impingement. MRI LUMBAR SPINE IMPRESSION:  1. No acute abnormality within the lumbar spine. 2. Mild noncompressive disc bulging at L1-2 through L3-4 without significant stenosis or impingement. 3. Mild bilateral facet hypertrophy at L2-3 through L4-5, which could contribute to lower back pain. 4. Transitional lumbosacral anatomy. Electronically Signed   By: Jeannine Boga M.D.   On: 12/07/2018 06:54   Mr Lumbar Spine W Wo Contrast  Result Date: 12/07/2018 CLINICAL DATA:  Initial evaluation for right thigh numbness for 10 days, low back pain. History of probable multiple sclerosis. EXAM: MRI THORACIC AND LUMBAR SPINE WITHOUT AND WITH CONTRAST TECHNIQUE: Multiplanar and multiecho pulse sequences of the thoracic and lumbar spine were obtained without and with intravenous contrast. CONTRAST:  10 cc of Gadavist. COMPARISON:  Prior brain MRI from 05/02/2018. FINDINGS: MRI THORACIC SPINE FINDINGS Alignment: Vertebral bodies normally aligned with preservation of the normal thoracic kyphosis. No listhesis or malalignment. Vertebrae: Vertebral body height maintained without evidence for acute or chronic fracture. Bone marrow signal intensity within normal limits. Subcentimeter benign hemangioma noted within the L2 vertebral body. No other discrete or worrisome osseous lesions. No abnormal marrow edema or enhancement. Cord: Mild prominence of the central canal at the level of T9 without frank syrinx formation. Patchy signal abnormality within the left hemi cord at the level of T8-9 (series 18, image 26). Patchy signal abnormality within the central and dorsal aspect of the cord at the level of T10-11 (series 18, image 31).  Additional signal abnormality within the left posterior cord at the level of T10-11 (series 18, image 32). Additional signal abnormality within the right posterior cord at the level of T11-12 (a series 18, image 34). There is suggestion of faint patchy enhancement about this lesion involving the right hemi cord at the level of T11-12, suspicious for possible active demyelination (series 21, image 9 on sagittal sequence, series 22, image 34 on axial sequence). No other definite abnormal enhancement to suggest active demyelination. Paraspinal and other soft tissues: Paraspinous soft tissues within normal limits. Partially visualized lungs are clear. Single approximate 1 cm T2 hyperintense simple cyst noted within the left kidney. Visualized visceral structures otherwise unremarkable. Disc levels: Minimal scattered degenerative disc bulging noted at T4-5, T6-7, T9-10, T10-11, and T12-L1. No focal disc herniation. No significant canal or foraminal stenosis. MRI LUMBAR SPINE FINDINGS Segmentation: Transitional lumbosacral anatomy with partial sacralization of the L5 vertebral body. L5-S1 disc somewhat rudimentary. Alignment: Physiologic with preservation of the normal lumbar lordosis. No listhesis. Vertebrae: Vertebral body height maintained without evidence for acute or chronic fracture. Bone marrow signal intensity within normal limits. Few scattered subcentimeter benign hemangiomas noted. No other discrete or worrisome osseous lesions. No abnormal marrow edema or enhancement. Conus medullaris: Extends to the L1 level and appears normal. Paraspinal and other soft tissues: Paraspinous soft tissues within normal limits. Visualized visceral structures unremarkable. Disc levels: L1-2: Mild annular disc bulge. No canal or foraminal stenosis. No impingement. L2-3: Mild annular disc bulge. Mild bilateral facet hypertrophy. No canal or foraminal stenosis. No impingement. L3-4: Mild circumferential disc bulge. Mild facet  hypertrophy. No canal or neural foraminal stenosis. No impingement. L4-5: Negative interspace. Mild facet hypertrophy. No canal or neural foraminal stenosis. No impingement. L5-S1: Transitional lumbosacral anatomy with partial sacralization of the L5 vertebral body. L5-S1 disc somewhat rudimentary. No disc bulge or disc protrusion. Middle facet degeneration. No canal or foraminal stenosis. No impingement. IMPRESSION: MRI THORACIC SPINE IMPRESSION: 1. Patchy multifocal cord signal abnormality involving the lower thoracic spine as above, suspicious for demyelinating disease, multiple sclerosis.  Subtle patchy enhancement about a lesion involving the right posterior cord at the level of T11-12 consistent with active demyelination. 2. Minor multilevel degenerative spondylolysis without significant stenosis or neural impingement. MRI LUMBAR SPINE IMPRESSION: 1. No acute abnormality within the lumbar spine. 2. Mild noncompressive disc bulging at L1-2 through L3-4 without significant stenosis or impingement. 3. Mild bilateral facet hypertrophy at L2-3 through L4-5, which could contribute to lower back pain. 4. Transitional lumbosacral anatomy. Electronically Signed   By: Jeannine Boga M.D.   On: 12/07/2018 06:54    Pending Labs Unresulted Labs (From admission, onward)    Start     Ordered   12/08/18 [id number]  Basic metabolic panel  Tomorrow morning,   R     12/07/18 0858   12/08/18 0500  CBC  Tomorrow morning,   R     12/07/18 0858   12/07/18 1112  VITAMIN D 25 Hydroxy (Vit-D Deficiency, Fractures)  Once,   STAT     12/07/18 1113   12/07/18 1057  SARS CORONAVIRUS 2 Nasal Swab Aptima Multi Swab  (Asymptomatic/Tier 2)  Once,   STAT    Question Answer Comment  Is this test for diagnosis or screening Screening   Symptomatic for COVID-19 as defined by CDC No   Hospitalized for COVID-19 No   Admitted to ICU for COVID-19 No   Previously tested for COVID-19 No   Resident in a congregate (group) care setting No    Employed in healthcare setting No   Pregnant No      12/07/18 1057   12/07/18 0859  Hemoglobin A1c  Once,   STAT    Comments: To assess prior glycemic control    12/07/18 0859   12/07/18 0856  HIV antibody (Routine Testing)  Once,   STAT     12/07/18 0858   12/06/18 2316  Urine Culture  Once,   STAT     12/06/18 2315          Vitals/Pain Today's Vitals   12/07/18 0300 12/07/18 0304 12/07/18 0311 12/07/18 1111  BP: 103/70   101/67  Pulse: 63   76  Resp: 18   16  Temp:    98.8 F (37.1 C)  TempSrc:    Oral  SpO2: 99%   98%  Weight:      Height:      PainSc:  7  1      Isolation Precautions No active isolations  Medications Medications  pantoprazole (PROTONIX) EC tablet 40 mg (40 mg Oral Given 12/07/18 1030)  propranolol ER (INDERAL LA) 24 hr capsule 80 mg (80 mg Oral Given 12/07/18 1030)  citalopram (CELEXA) tablet 40 mg (40 mg Oral Given 12/07/18 1030)  levothyroxine (SYNTHROID) tablet 100 mcg (has no administration in time range)  enoxaparin (LOVENOX) injection 40 mg (has no administration in time range)  acetaminophen (TYLENOL) tablet 650 mg (has no administration in time range)    Or  acetaminophen (TYLENOL) suppository 650 mg (has no administration in time range)  ondansetron (ZOFRAN) tablet 4 mg (has no administration in time range)    Or  ondansetron (ZOFRAN) injection 4 mg (has no administration in time range)  insulin aspart (novoLOG) injection 0-5 Units (has no administration in time range)  insulin aspart (novoLOG) injection 0-9 Units (has no administration in time range)  methylPREDNISolone sodium succinate (SOLU-MEDROL) 1,000 mg in sodium chloride 0.9 % 50 mL IVPB (has no administration in time range)  fentaNYL (SUBLIMAZE) injection 25 mcg (25 mcg Intravenous  Given 12/06/18 2033)  ondansetron [facility]) injection 4 mg (4 mg Intravenous Given 12/07/18 0258)  fentaNYL (SUBLIMAZE) injection 25 mcg (25 mcg Intravenous Given 12/07/18 0301)  fentaNYL (SUBLIMAZE)  injection 25 mcg (25 mcg Intravenous Given 12/07/18 0411)  gadobutrol (GADAVIST) 1 MMOL/ML injection 10 mL (10 mLs Intravenous Contrast Given 12/07/18 0531)    Mobility walks with person assist Low fall risk   Focused Assessments    R Recommendations: See Admitting Provider Note  Report given to: Ara RN   Additional Notes:  Transported via Biomed[employer] by [employer]

## 2018-12-07 NOTE — Consult Note (Addendum)
NEURO HOSPITALIST CONSULT NOTE   Requestig physician: Dr. Stark Jock   Reason for Consult: MS exacerbation   History obtained from:  Patient    HPI:                                                                                                                                          Lisa [name] is an 38 y.o. female with PMH HTN, MS anxiety presented to [facility] ED with c/o 2 days of back pain with radicular symptoms.  Per patient she picked up her 36 year old and began having lumbar pain that went down the posterior of BLE. Rates pain initially as 9/10 to 10/10 but pain has decreased to 7/10. Her numbness in her right thigh has been present for about 10 days. C/o RLE numbness and weakness. She endorses urinary urge incontinence and states she has had this in the past with neurological symptoms.   History: 2013 patient MRI  Showed a demyelinating lesion in the right parietal lobe. CSF did not show oligoclonal bands.   03/2018 had an episode pf mild weakness in the left hand and blurred vision that lasted for 1 week. Ophthalmology exam was normal. 04/2018 [facility] for numbness of LUE and LLE accompanied by incontinence. MRI brain showed no new demyelinating lesions. 2/20: seen by Dr. Nestor Ramp, per patient MS medication was going to be started but was refused by insurance d/t only 1 lesion being present.  Past Medical History:  Diagnosis Date  . Anxiety   . Headache(784.0)   . Hypertension   . Neuromuscular disorder ([place])    sus  . PONV (postoperative nausea and vomiting)   . Thyroid nodule     Past Surgical History:  Procedure Laterality Date  . ADENOIDECTOMY    . CESAREAN SECTION    . CESAREAN SECTION    . THYROIDECTOMY  06/18/2011   Procedure: THYROIDECTOMY;  Surgeon: Lisa Quitter, MD;  Location: [place];  Service: ENT;  Laterality: Right;  . TONSILLECTOMY    . TYMPANOPLASTY      No family history on file.           Social History:  reports that she  has quit smoking. She has never used smokeless tobacco. She reports current alcohol use. She reports that she does not use drugs.  Allergies  Allergen Reactions  . Sulfa Antibiotics Anaphylaxis  . Darvocet [Propoxyphene N-Acetaminophen] Other (See Comments)    Reaction:  Hallucinations   . Cephalexin Rash  . Imitrex [Sumatriptan Base] Other (See Comments)    Reaction:  Makes pts jaw lock-up  . Penicillins Rash and Other (See Comments)    Has patient had a PCN reaction causing immediate rash, facial/tongue/throat swelling, SOB or lightheadedness with hypotension: No Has patient  had a PCN reaction causing severe rash involving mucus membranes or skin necrosis: No Has patient had a PCN reaction that required hospitalization: No Has patient had a PCN reaction occurring within the last 10 years: No If all of the above answers are "NO", then may proceed with Cephalosporin use.    MEDICATIONS:                                                                                                                     Current Facility-Administered Medications  Medication Dose Route Frequency Provider Last Rate Last Dose  . acetaminophen (TYLENOL) tablet 650 mg  650 mg Oral Q6H PRN Lisa Homes, MD       Or  . acetaminophen (TYLENOL) suppository 650 mg  650 mg Rectal Q6H PRN Lisa Homes, MD      . citalopram (CELEXA) tablet 40 mg  40 mg Oral Daily [name], Justin, MD      . enoxaparin (LOVENOX) injection 40 mg  40 mg Subcutaneous Q24H [name], Justin, MD      . insulin aspart (novoLOG) injection 0-5 Units  0-5 Units Subcutaneous QHS [name], Justin, MD      . insulin aspart (novoLOG) injection 0-9 Units  0-9 Units Subcutaneous TID WC [name], Lisa Ina, MD      . Derrill Memo ON 12/08/2018] levothyroxine (SYNTHROID) tablet 100 mcg  100 mcg Oral QAC breakfast [name], Justin, MD      . methylPREDNISolone sodium succinate (SOLU-MEDROL) 1,000 mg in sodium chloride 0.9 % 50 mL IVPB  1,000 mg Intravenous Daily  Lisa Homes, MD 58 mL/hr at 12/07/18 0911 1,000 mg at 12/07/18 0911  . ondansetron (ZOFRAN) tablet 4 mg  4 mg Oral Q6H PRN Lisa Homes, MD       Or  . ondansetron (ZOFRAN) injection 4 mg  4 mg Intravenous Q6H PRN [name], Justin, MD      . pantoprazole (PROTONIX) EC tablet 40 mg  40 mg Oral Daily [name], Justin, MD      . propranolol ER (INDERAL LA) 24 hr capsule 80 mg  80 mg Oral Daily Lisa Homes, MD       Current Outpatient Medications  Medication Sig Dispense Refill  . citalopram (CELEXA) 40 MG tablet Take 1 tablet (40 mg total) by mouth daily. 30 tablet 0  . levothyroxine (SYNTHROID) 100 MCG tablet TAKE 1 TABLET BY MOUTH DAILY BEFORE BREAKFAST (Patient taking differently: Take 100 mcg by mouth daily. ) 30 tablet 3  . propranolol ER (INDERAL LA) 80 MG 24 hr capsule TAKE 1 CAPSULE BY MOUTH EVERY DAY (Patient taking differently: Take 80 mg by mouth daily. ) 30 capsule 3      ROS:  ROS was performed and is negative except as noted in HPI     Blood pressure 103/70, pulse 63, temperature 99.7 F (37.6 C), temperature source Oral, resp. rate 18, height 5\' 10"  (1.778 m), weight 113.4 kg, last menstrual period 11/22/2018, SpO2 99 %.   General Examination:                                                                                                       Physical Exam  HEENT-  Normocephalic, no lesions, without obvious abnormality.  Normal external eye and conjunctiva.   Cardiovascular-  pulses palpable throughout   Lungs-.  Saturations within normal limits on RA Abdomen- All 4 quadrants palpated and nontender Extremities- Warm, dry and intact Musculoskeletal-no joint tenderness, deformity or swelling Skin-warm and dry, no hyperpigmentation, vitiligo, or suspicious lesions  Neurological Examination Mental Status: Alert, oriented, thought  content appropriate.  Speech fluent without evidence of aphasia.  Able to follow commands without difficulty. Cranial Nerves: II:  Visual fields grossly normal,  III,IV, VI: ptosis not present, extra-ocular motions intact bilaterally pupils equal, round, reactive to light and accommodation V,VII: smile symmetric, facial light touch sensation normal bilaterally VIII: hearing normal bilaterally IX,X: uvula rises symmetrically XI: bilateral shoulder shrug XII: midline tongue extension Motor: Right : Upper extremity   5/5    Left:     Upper extremity   5/5  Lower extremity   5/5     Lower extremity   5/5 Tone and bulk:normal tone throughout; no atrophy noted Sensory: decreased sensation in LLE to light touch and pinprick. Thigh is more numb than calf. Describes calf as just beginning to go numb, but can feel a difference. Normal sensation in LUE and RUE and RLE.  Deep Tendon Reflexes: 2+ and symmetric throughout Cerebellar: normal finger-to-nose, normal rapid alternating movements and normal heel-to-shin test Gait: deferred   Lab Results: Basic Metabolic Panel: Recent Labs  Lab 12/06/18 2331  NA 136  K 4.7  CL 109  CO2 20*  GLUCOSE 150*  BUN 12  CREATININE 0.68  CALCIUM 9.2    CBC: Recent Labs  Lab 12/06/18 2331  WBC 11.0*  NEUTROABS 10.3*  HGB 11.9*  HCT 37.9  MCV 86.1  PLT 241    Cardiac Enzymes: No results for input(s): CKTOTAL, CKMB, CKMBINDEX, TROPONINI in the last 168 hours.  Lipid Panel: No results for input(s): CHOL, TRIG, HDL, CHOLHDL, VLDL, LDLCALC in the last 168 hours.  Imaging: Mr Thoracic Spine W Wo Contrast  Result Date: 12/07/2018 CLINICAL DATA:  Initial evaluation for right thigh numbness for 10 days, low back pain. History of probable multiple sclerosis. EXAM: MRI THORACIC AND LUMBAR SPINE WITHOUT AND WITH CONTRAST TECHNIQUE: Multiplanar and multiecho pulse sequences of the thoracic and lumbar spine were obtained without and with intravenous  contrast. CONTRAST:  10 cc of Gadavist. COMPARISON:  Prior brain MRI from 05/02/2018. FINDINGS: MRI THORACIC SPINE FINDINGS Alignment: Vertebral bodies normally aligned with preservation of the normal thoracic kyphosis. No listhesis or malalignment. Vertebrae: Vertebral body height maintained without evidence for acute or chronic fracture.  Bone marrow signal intensity within normal limits. Subcentimeter benign hemangioma noted within the L2 vertebral body. No other discrete or worrisome osseous lesions. No abnormal marrow edema or enhancement. Cord: Mild prominence of the central canal at the level of T9 without frank syrinx formation. Patchy signal abnormality within the left hemi cord at the level of T8-9 (series 18, image 26). Patchy signal abnormality within the central and dorsal aspect of the cord at the level of T10-11 (series 18, image 31). Additional signal abnormality within the left posterior cord at the level of T10-11 (series 18, image 32). Additional signal abnormality within the right posterior cord at the level of T11-12 (a series 18, image 34). There is suggestion of faint patchy enhancement about this lesion involving the right hemi cord at the level of T11-12, suspicious for possible active demyelination (series 21, image 9 on sagittal sequence, series 22, image 34 on axial sequence). No other definite abnormal enhancement to suggest active demyelination. Paraspinal and other soft tissues: Paraspinous soft tissues within normal limits. Partially visualized lungs are clear. Single approximate 1 cm T2 hyperintense simple cyst noted within the left kidney. Visualized visceral structures otherwise unremarkable. Disc levels: Minimal scattered degenerative disc bulging noted at T4-5, T6-7, T9-10, T10-11, and T12-L1. No focal disc herniation. No significant canal or foraminal stenosis. MRI LUMBAR SPINE FINDINGS Segmentation: Transitional lumbosacral anatomy with partial sacralization of the L5 vertebral  body. L5-S1 disc somewhat rudimentary. Alignment: Physiologic with preservation of the normal lumbar lordosis. No listhesis. Vertebrae: Vertebral body height maintained without evidence for acute or chronic fracture. Bone marrow signal intensity within normal limits. Few scattered subcentimeter benign hemangiomas noted. No other discrete or worrisome osseous lesions. No abnormal marrow edema or enhancement. Conus medullaris: Extends to the L1 level and appears normal. Paraspinal and other soft tissues: Paraspinous soft tissues within normal limits. Visualized visceral structures unremarkable. Disc levels: L1-2: Mild annular disc bulge. No canal or foraminal stenosis. No impingement. L2-3: Mild annular disc bulge. Mild bilateral facet hypertrophy. No canal or foraminal stenosis. No impingement. L3-4: Mild circumferential disc bulge. Mild facet hypertrophy. No canal or neural foraminal stenosis. No impingement. L4-5: Negative interspace. Mild facet hypertrophy. No canal or neural foraminal stenosis. No impingement. L5-S1: Transitional lumbosacral anatomy with partial sacralization of the L5 vertebral body. L5-S1 disc somewhat rudimentary. No disc bulge or disc protrusion. Middle facet degeneration. No canal or foraminal stenosis. No impingement. IMPRESSION: MRI THORACIC SPINE IMPRESSION: 1. Patchy multifocal cord signal abnormality involving the lower thoracic spine as above, suspicious for demyelinating disease, multiple sclerosis. Subtle patchy enhancement about a lesion involving the right posterior cord at the level of T11-12 consistent with active demyelination. 2. Minor multilevel degenerative spondylolysis without significant stenosis or neural impingement. MRI LUMBAR SPINE IMPRESSION: 1. No acute abnormality within the lumbar spine. 2. Mild noncompressive disc bulging at L1-2 through L3-4 without significant stenosis or impingement. 3. Mild bilateral facet hypertrophy at L2-3 through L4-5, which could  contribute to lower back pain. 4. Transitional lumbosacral anatomy. Electronically Signed   By: Jeannine Boga M.D.   On: 12/07/2018 06:54   Mr Lumbar Spine W Wo Contrast  Result Date: 12/07/2018 CLINICAL DATA:  Initial evaluation for right thigh numbness for 10 days, low back pain. History of probable multiple sclerosis. EXAM: MRI THORACIC AND LUMBAR SPINE WITHOUT AND WITH CONTRAST TECHNIQUE: Multiplanar and multiecho pulse sequences of the thoracic and lumbar spine were obtained without and with intravenous contrast. CONTRAST:  10 cc of Gadavist. COMPARISON:  Prior brain MRI  from 05/02/2018. FINDINGS: MRI THORACIC SPINE FINDINGS Alignment: Vertebral bodies normally aligned with preservation of the normal thoracic kyphosis. No listhesis or malalignment. Vertebrae: Vertebral body height maintained without evidence for acute or chronic fracture. Bone marrow signal intensity within normal limits. Subcentimeter benign hemangioma noted within the L2 vertebral body. No other discrete or worrisome osseous lesions. No abnormal marrow edema or enhancement. Cord: Mild prominence of the central canal at the level of T9 without frank syrinx formation. Patchy signal abnormality within the left hemi cord at the level of T8-9 (series 18, image 26). Patchy signal abnormality within the central and dorsal aspect of the cord at the level of T10-11 (series 18, image 31). Additional signal abnormality within the left posterior cord at the level of T10-11 (series 18, image 32). Additional signal abnormality within the right posterior cord at the level of T11-12 (a series 18, image 34). There is suggestion of faint patchy enhancement about this lesion involving the right hemi cord at the level of T11-12, suspicious for possible active demyelination (series 21, image 9 on sagittal sequence, series 22, image 34 on axial sequence). No other definite abnormal enhancement to suggest active demyelination. Paraspinal and other soft  tissues: Paraspinous soft tissues within normal limits. Partially visualized lungs are clear. Single approximate 1 cm T2 hyperintense simple cyst noted within the left kidney. Visualized visceral structures otherwise unremarkable. Disc levels: Minimal scattered degenerative disc bulging noted at T4-5, T6-7, T9-10, T10-11, and T12-L1. No focal disc herniation. No significant canal or foraminal stenosis. MRI LUMBAR SPINE FINDINGS Segmentation: Transitional lumbosacral anatomy with partial sacralization of the L5 vertebral body. L5-S1 disc somewhat rudimentary. Alignment: Physiologic with preservation of the normal lumbar lordosis. No listhesis. Vertebrae: Vertebral body height maintained without evidence for acute or chronic fracture. Bone marrow signal intensity within normal limits. Few scattered subcentimeter benign hemangiomas noted. No other discrete or worrisome osseous lesions. No abnormal marrow edema or enhancement. Conus medullaris: Extends to the L1 level and appears normal. Paraspinal and other soft tissues: Paraspinous soft tissues within normal limits. Visualized visceral structures unremarkable. Disc levels: L1-2: Mild annular disc bulge. No canal or foraminal stenosis. No impingement. L2-3: Mild annular disc bulge. Mild bilateral facet hypertrophy. No canal or foraminal stenosis. No impingement. L3-4: Mild circumferential disc bulge. Mild facet hypertrophy. No canal or neural foraminal stenosis. No impingement. L4-5: Negative interspace. Mild facet hypertrophy. No canal or neural foraminal stenosis. No impingement. L5-S1: Transitional lumbosacral anatomy with partial sacralization of the L5 vertebral body. L5-S1 disc somewhat rudimentary. No disc bulge or disc protrusion. Middle facet degeneration. No canal or foraminal stenosis. No impingement. IMPRESSION: MRI THORACIC SPINE IMPRESSION: 1. Patchy multifocal cord signal abnormality involving the lower thoracic spine as above, suspicious for  demyelinating disease, multiple sclerosis. Subtle patchy enhancement about a lesion involving the right posterior cord at the level of T11-12 consistent with active demyelination. 2. Minor multilevel degenerative spondylolysis without significant stenosis or neural impingement. MRI LUMBAR SPINE IMPRESSION: 1. No acute abnormality within the lumbar spine. 2. Mild noncompressive disc bulging at L1-2 through L3-4 without significant stenosis or impingement. 3. Mild bilateral facet hypertrophy at L2-3 through L4-5, which could contribute to lower back pain. 4. Transitional lumbosacral anatomy. Electronically Signed   By: Jeannine Boga M.D.   On: 12/07/2018 06:54    Assessment:  Lisa [name] is an 38 y.o. female with PMH HTN, MS anxiety presented to [facility] ED with c/o 2 days of back pain with radicular symptoms.MRI T-spine shows a new lesion  concerning for demyelinating disease. Admit for solumedrol for 3-5 days.   Impression: MS exacerbation   Recommendations: -- Cancelled MRI Brain and MRI C spine: recent MRI in jan 2020 --Solumedrol 1G daily x 3 days --protonix 40 mg daily -- vit D level -- f/u outpatient neurology ( either in [place] or she can continue to see Dr. Nestor Ramp at [facility] patient choice) --neuro will continue to follow  Laurey Morale, MSN, NP-C [facility] [phone]  Attending neurologist's note to follow   12/07/2018, 8:08 AM  NEUROHOSPITALIST ADDENDUM Performed a face to face diagnostic evaluation.   I have reviewed the contents of history and physical exam as documented by PA/ARNP/Resident and agree with above documentation.  I have discussed and formulated the above plan as documented. Edits to the note have been made as needed.  Patient with diagnosis of MS based on multiple clinical events and significant 1 signal lesion on MRI brain-was supposed to start Tecf[employer]a by her neurologist at [facility] however was denied by insurance. She  presents today with 2-day of numbness over her left leg and increasing back pain. MRI T spine today showed patchy multifocal cord abnormality suspicious for demyelinating disease with a subtotal patchy enhancement at T11-T12 which would suggest active demyelination.  On examination patient has normal cranial reflexes, no RAPD, 5/5 motor strength in all 4 extremities with normal tone, no hyperreflexia.  She has reduced sensation to light touch and temperature over the anterior aspect of her right thigh and subjective paresthesias over her right leg.  Recommend admission for 3 days of IV steroids.  Patient can follow-up with outpatient neurologist to start disease modifying therapy.  Initially recommended getting MRI brain and C-spine but then realized she recently had them done in January of this year.  Since she is not complaining of deficits that would localize to her brain and cervical spine, will hold off ordering this during this admission as this will not change management.  Her neurologist may opt to repeat them.   Karena Addison Aroor MD [facility] [phone]   If 7pm to 7am, please call on call as listed on AMION.

## 2018-12-07 NOTE — ED Notes (Signed)
Call placed to MRI to ask about estimated time again. Per MRI they still dont have an estimated time.

## 2018-12-07 NOTE — ED Notes (Signed)
Call placed to MRI to ask about estimated time. Per MRI they dont have an estimated time.

## 2018-12-07 NOTE — ED Provider Notes (Signed)
Care assumed from Dr. Lodema Hong and Dr. Sabra Heck at shift change.  Patient presenting with symptoms concerning for multiple sclerosis.  She has had neurologic issues in the past that have been suspected of MS, however no definitive diagnosis has been made.  Patient having right leg numbness for approximately 10 days, now with back pain and urinary incontinence.  Her MRI this evening reveals demyelinating lesions in the thoracic spine consistent with MS.  This finding was discussed with Dr. Lorraine Lax from neurology.  He is recommending high-dose steroids, additional MRI studies, and admission.  I have spoken with the internal medicine teaching service who agrees to admit.   Veryl Speak, MD 12/07/18 [phone]

## 2018-12-07 NOTE — ED Notes (Signed)
Patient husband brought food to room, for patient lunch.

## 2018-12-08 DIAGNOSIS — I10 Essential (primary) hypertension: Secondary | ICD-10-CM

## 2018-12-08 DIAGNOSIS — Z79899 Other long term (current) drug therapy: Secondary | ICD-10-CM

## 2018-12-08 DIAGNOSIS — F419 Anxiety disorder, unspecified: Secondary | ICD-10-CM

## 2018-12-08 DIAGNOSIS — G35 Multiple sclerosis: Principal | ICD-10-CM

## 2018-12-08 LAB — HIV ANTIBODY (ROUTINE TESTING W REFLEX): HIV Screen 4th Generation wRfx: NONREACTIVE

## 2018-12-08 LAB — URINE CULTURE: Culture: <10000 — AB

## 2018-12-08 LAB — BASIC METABOLIC PANEL
Anion gap: 9 (ref 5–15)
BUN: 9 mg/dL (ref 6–20)
CO2: 23 mmol/L (ref 22–32)
Calcium: 8.9 mg/dL (ref 8.9–10.3)
Chloride: 106 mmol/L (ref 98–111)
Creatinine, Ser: 0.77 mg/dL (ref 0.44–1.00)
GFR calc Af Amer: >60 mL/min (ref >60)
GFR calc non Af Amer: >60 mL/min (ref >60)
Glucose, Bld: 128 mg/dL — ABNORMAL HIGH (ref 70–99)
Potassium: 3.9 mmol/L (ref 3.5–5.1)
Sodium: 138 mmol/L (ref 135–145)

## 2018-12-08 LAB — GLUCOSE, CAPILLARY
Glucose-Capillary: 175 mg/dL — ABNORMAL HIGH (ref 70–99)
Glucose-Capillary: 119 mg/dL — ABNORMAL HIGH (ref 70–99)
Glucose-Capillary: 98 mg/dL (ref 70–99)
Glucose-Capillary: 131 mg/dL — ABNORMAL HIGH (ref 70–99)
Glucose-Capillary: 115 mg/dL — ABNORMAL HIGH (ref 70–99)

## 2018-12-08 LAB — CBC
HCT: 36.6 % (ref 36.0–46.0)
Hemoglobin: 11.5 g/dL — ABNORMAL LOW (ref 12.0–15.0)
MCH: 26.9 pg (ref 26.0–34.0)
MCHC: 31.4 g/dL (ref 30.0–36.0)
MCV: 85.7 fL (ref 80.0–100.0)
Platelets: 252 10*3/uL (ref 150–400)
RBC: 4.27 MIL/uL (ref 3.87–5.11)
RDW: 14.1 % (ref 11.5–15.5)
WBC: 15.2 10*3/uL — ABNORMAL HIGH (ref 4.0–10.5)
nRBC: 0 % (ref 0.0–0.2)

## 2018-12-08 NOTE — Progress Notes (Signed)
   Subjective: Mrs. Khatun was seen at bedside today. She is awake and alert. She did not get much sleep overnight, which she attributes to her steroids. She is going to call her neurologist today to see if she can get an urgent appointment. She will let us know. She also notes that her CBGs were high. Overall symptoms have improved.  We discussed continuing to coordinate care with neurology and continuing steroids.   Objective:  Vital signs in last 24 hours: Vitals:   12/07/18 1655 12/07/18 1949 12/07/18 2346 12/08/18 0348  BP: 116/70 128/79 112/76 112/71  Pulse: 70 75 68 73  Resp:  16 17 14   Temp: 98.8 F (37.1 C) 98 F (36.7 C) 98.7 F (37.1 C) 97.9 F (36.6 C)  TempSrc: Oral Oral Oral Oral  SpO2: 99% 99% 99% 100%  Weight:      Height:       Physical Exam Constitutional:      General: She is not in acute distress.    Appearance: Normal appearance. She is not ill-appearing, toxic-appearing or diaphoretic.  HENT:     Head: Normocephalic and atraumatic.  Eyes:     General:        Right eye: No discharge.  Cardiovascular:     Rate and Rhythm: Normal rate and regular rhythm.     Pulses: Normal pulses.     Heart sounds: Normal heart sounds. No murmur. No friction rub. No gallop.   Pulmonary:     Effort: Pulmonary effort is normal. No respiratory distress.     Breath sounds: Normal breath sounds. No stridor. No wheezing.  Neurological:     Mental Status: She is alert.     Assessment/Plan:  Active Problems:   Demyelinating lesion [facility])  Mrs. Jelicia Parshall is a 38 y/o female that presents to [facility] with an [place].  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 We appreciate their help with Mrs. Fleury's care.               - Solumedrol 1g Day 2/3             - Protonix 40 mg QD             - F/U outpatient neurology  - Vitamin D 25 Hydroxy: Pending - SSI for increase sugar 2/2 to solumedrol dosing.   Anxiety:  -  Continue Citalopram 40 mg QD.  Hypertension:  - Continue Propranolol 24 hr 80 mg QD.   Dispo: Anticipated discharge pending medical course.   Maudie Mercury MD 12/08/2018, 7:37 AM

## 2018-12-09 DIAGNOSIS — Z881 Allergy status to other antibiotic agents status: Secondary | ICD-10-CM

## 2018-12-09 DIAGNOSIS — Z888 Allergy status to other drugs, medicaments and biological substances status: Secondary | ICD-10-CM

## 2018-12-09 DIAGNOSIS — Z880 Allergy status to penicillin: Secondary | ICD-10-CM

## 2018-12-09 DIAGNOSIS — Z885 Allergy status to narcotic agent status: Secondary | ICD-10-CM

## 2018-12-09 LAB — BASIC METABOLIC PANEL
Anion gap: 7 (ref 5–15)
BUN: 19 mg/dL (ref 6–20)
CO2: 24 mmol/L (ref 22–32)
Calcium: 8.9 mg/dL (ref 8.9–10.3)
Chloride: 107 mmol/L (ref 98–111)
Creatinine, Ser: 0.82 mg/dL (ref 0.44–1.00)
GFR calc Af Amer: >60 mL/min (ref >60)
GFR calc non Af Amer: >60 mL/min (ref >60)
Glucose, Bld: 123 mg/dL — ABNORMAL HIGH (ref 70–99)
Potassium: 4.4 mmol/L (ref 3.5–5.1)
Sodium: 138 mmol/L (ref 135–145)

## 2018-12-09 LAB — GLUCOSE, CAPILLARY
Glucose-Capillary: 114 mg/dL — ABNORMAL HIGH (ref 70–99)
Glucose-Capillary: 120 mg/dL — ABNORMAL HIGH (ref 70–99)
Glucose-Capillary: 92 mg/dL (ref 70–99)

## 2018-12-09 LAB — VITAMIN D 25 HYDROXY (VIT D DEFICIENCY, FRACTURES): Vit D, 25-Hydroxy: 26.3 ng/mL — ABNORMAL LOW (ref 30.0–100.0)

## 2018-12-09 NOTE — Progress Notes (Addendum)
   Subjective: Mrs. Gazzo was seen at bedside today. She is awake and alert. We spoke about her overnight events. She states that the feeling in her R thigh is beginning to return, but is not at baseline. She states that her urinary symptoms have been alleviated. She is eager to return home to see her family. All questions and concerns were addressed.   Objective:  Vital signs in last 24 hours: Vitals:   12/08/18 1900 12/08/18 1953 12/09/18 0332 12/09/18 0700  BP: 110/70 107/72 109/77 114/72  Pulse: 67 (!) 55 63 60  Resp: 18 17 16 18   Temp: 98.1 F (36.7 C) 99.1 F (37.3 C) 98.6 F (37 C) 98.5 F (36.9 C)  TempSrc: Oral Oral Oral Oral  SpO2: 98% 98% 98% 100%  Weight:      Height:       Physical Exam: General: Well developed and well nourished, alert and oriented, cooperative, appears in no distress. Head: Normocephalic, atraumatic.  Cardiac: RRR, Normal S1 and S2, no murmurs, rubs, or gallops.  Abdomen: + bowel sounds, soft, non distended.  Pulmonary: Clear to auscultation bilaterally, no wheezes, rales, or rhonchi.   Assessment/Plan:  Principal Problem:   Multiple sclerosis [facility])  Mrs. Micayah Prewitt is a 38 y/o female that presents to [facility] with an [place].  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 We appreciate their help with Mrs. Wisham's care.               - Solumedrol course completed.             - Protonix 40 mg QD             - F/U outpatient neurology with on the 28th and 31st of August.   - Vitamin D 25 Hydroxy: 26.3 - SSI for increase sugar 2/2 to solumedrol dosing.   Anxiety:  - Continue Citalopram 40 mg QD.  Hypertension:  - Continue Propranolol 24 hr 80 mg QD.   Dispo: Anticipated discharge today.   Maudie Mercury MD 12/09/2018, 11:01 AM

## 2018-12-09 NOTE — Progress Notes (Signed)
Patient discharged home with self care. Education and instructions given to patient. IV removed. All belongings with patient. Patient ambulating off floor with RN.

## 2018-12-09 NOTE — Discharge Summary (Signed)
Name: Lisa [name] MRN: [id number] DOB: [birth year] 38 y.o. PCP: Kathyrn Drown, MD  Date of Admission: 12/06/2018  7:00 PM Date of Discharge: 12/09/2018 Attending Physician: Lucious Groves, DO  Discharge Diagnosis: 1. Multiple Sclerosis Flare  Discharge Medications: Allergies as of 12/09/2018      Reactions   Sulfa Antibiotics Anaphylaxis   Darvocet [propoxyphene N-acetaminophen] Other (See Comments)   Reaction:  Hallucinations    Cephalexin Rash   Imitrex [sumatriptan Base] Other (See Comments)   Reaction:  Makes pts jaw lock-up   Penicillins Rash, Other (See Comments)   Has patient had a PCN reaction causing immediate rash, facial/tongue/throat swelling, SOB or lightheadedness with hypotension: No Has patient had a PCN reaction causing severe rash involving mucus membranes or skin necrosis: No Has patient had a PCN reaction that required hospitalization: No Has patient had a PCN reaction occurring within the last 10 years: No If all of the above answers are "NO", then may proceed with Cephalosporin use.      Medication List    TAKE these medications   citalopram 40 MG tablet Commonly known as: CELEXA Take 1 tablet (40 mg total) by mouth daily.   levothyroxine 100 MCG tablet Commonly known as: SYNTHROID TAKE 1 TABLET BY MOUTH DAILY BEFORE BREAKFAST What changed:   how much to take  how to take this  when to take this  additional instructions   propranolol ER 80 MG 24 hr capsule Commonly known as: INDERAL LA TAKE 1 CAPSULE BY MOUTH EVERY DAY What changed:   how much to take  how to take this  when to take this  additional instructions     Disposition and follow-up:   Ms.Donell F Proudfoot was discharged from [facility] in Stable condition.  At the hospital follow up visit please address:  1.  MS Flare. Ensure the patient has been seen by neurology. Discuss starting supplemental vitamin D.   2.  Labs / imaging needed at  time of follow-up: None  3.  Pending labs/ test needing follow-up: None  Follow-up Appointments: Follow-up Information    Kathyrn Drown, MD. Call.   Specialty: Family Medicine Contact information: [address] [place] [address] [phone]        Riki Sheer, MD. Call.   Specialty: Neurosurgery Contact information: [place] [address] [place] by problem list:  1. Multiple Sclerosis Flare. Lisa [name] is a 38 y.o female who presented to the emergency department on 8/22 with right leg numbness and back pain. She had a previous clinical diagnosis of multiple sclerosis and was following with outpatient neurology. MRI of the thoracic spine and lumbar spine (Results can be seen below) illustrated new demyelinating lesions consistent with an acute multiple sclerosis flare. Neurology was consulted and recommended high dose IV Solu-Medrol for three days. Her symptoms subsequently resolved. She was scheduled for follow-up with her neurologist at [facility]. She will be seen by them via telehealth visit on Friday and then an in person visit on Monday. She was discharged in stable condition with strict return precautions.  Discharge Vitals:   BP 114/72 (BP Location: Left Arm)   Pulse 60   Temp 98.5 F (36.9 C) (Oral)   Resp 18   Ht 5\' 10"  (1.778 m)   Wt 113.4 kg   LMP 11/22/2018 (Exact Date)   SpO2 100%   BMI 35.87 kg/m  Pertinent Labs, Studies, and Procedures:   MRI THORACIC SPINE IMPRESSION:  1. Patchy multifocal cord signal abnormality involving the lower thoracic spine as above, suspicious for demyelinating disease, multiple sclerosis. Subtle patchy enhancement about a lesion involving the right posterior cord at the level of T11-12 consistent with active demyelination. 2. Minor multilevel degenerative spondylolysis without significant stenosis or neural impingement.  MRI LUMBAR SPINE  IMPRESSION:  1. No acute abnormality within the lumbar spine. 2. Mild noncompressive disc bulging at L1-2 through L3-4 without significant stenosis or impingement. 3. Mild bilateral facet hypertrophy at L2-3 through L4-5, which could contribute to lower back pain. 4. Transitional lumbosacral anatomy.  Discharge Instructions: Discharge Instructions    Diet - low sodium heart healthy   Complete by: As directed    Discharge instructions   Complete by: As directed    Thank you for allowing [place] to provide your care. It will be important that you follow up with neurology as soon as possible. We are glad your pain and numbness have resolved but if they were to recur please come back to the hospital to be evaluated.   Increase activity slowly   Complete by: As directed     Signed: Ina Homes, MD 12/09/2018, 10:50 AM

## 2018-12-18 ENCOUNTER — Other Ambulatory Visit: Payer: Self-pay

## 2018-12-19 ENCOUNTER — Ambulatory Visit (INDEPENDENT_AMBULATORY_CARE_PROVIDER_SITE_OTHER): Payer: 59 | Admitting: Family Medicine

## 2018-12-19 DIAGNOSIS — E890 Postprocedural hypothyroidism: Secondary | ICD-10-CM | POA: Diagnosis not present

## 2018-12-19 DIAGNOSIS — G43009 Migraine without aura, not intractable, without status migrainosus: Secondary | ICD-10-CM

## 2018-12-19 DIAGNOSIS — G35 Multiple sclerosis: Secondary | ICD-10-CM | POA: Diagnosis not present

## 2018-12-19 DIAGNOSIS — F419 Anxiety disorder, unspecified: Secondary | ICD-10-CM | POA: Diagnosis not present

## 2018-12-19 MED ORDER — LEVOTHYROXINE SODIUM 100 MCG PO TABS: ORAL_TABLET | ORAL | 1 refills | Status: DC

## 2018-12-19 MED ORDER — PROPRANOLOL HCL ER 80 MG PO CP24: ORAL_CAPSULE | ORAL | 1 refills | Status: DC

## 2018-12-19 MED ORDER — CITALOPRAM HYDROBROMIDE 40 MG PO TABS: 40.0000 mg | ORAL_TABLET | Freq: Every day | ORAL | 1 refills | Status: DC

## 2018-12-19 NOTE — Progress Notes (Signed)
   Subjective:    Patient ID: Lisa [name], female    DOB: 1981-03-22, 38 y.o.   MRN: [id number]  HPI Patient with a history of hypothyroidism takes her medicine on a regular basis states energy level overall doing well recent stay in the hospital TSH looked good PMH hypothyroidism She is also had some atypical neurologic symptoms that was felt to be MS but then recent flareup definitely proved MS along with MRI results Patient calls for a follow upon thyroid. Patient also states she was in the hospital recently and dx with multiple sclerosis. Patient was in the hospital did an MRI of thoracic spine and lumbar spine showed MS lesions they treated her with Solu-Medrol and it should be noted that she will be seeing a specialist at [facility] they will start her on MS treatments at that time  Hypothyroidism overall doing well with medication states energy level doing well takes her medicine early in the morning is compliant with it.  Denies any side effects or symptoms  Migraines gets 1 bad migraine per month but propanolol due to very good job she would like to continue this for now  Anxiety doing well on the Celexa she would like to stick with this dose she feels it is helping her Virtual Visit via Video Note  I connected with Lisa [name] on 12/19/18 at 10:00 AM EDT by a video enabled telemedicine application and verified that I am speaking with the correct person using two identifiers.  Location: Patient: home Provider: office   I discussed the limitations of evaluation and management by telemedicine and the availability of in person appointments. The patient expressed understanding and agreed to proceed.  History of Present Illness:    Observations/Objective:   Assessment and Plan:   Follow Up Instructions:    I discussed the assessment and treatment plan with the patient. The patient was provided an opportunity to ask questions and all were  answered. The patient agreed with the plan and demonstrated an understanding of the instructions.   The patient was advised to call back or seek an in-person evaluation if the symptoms worsen or if the condition fails to improve as anticipated.  I provided 15 minutes of non-face-to-face time during this encounter.      Review of Systems  Constitutional: Negative for activity change, appetite change, fatigue, fever and unexpected weight change.  HENT: Negative for congestion and rhinorrhea.   Respiratory: Negative for cough and shortness of breath.   Cardiovascular: Negative for chest pain, palpitations and leg swelling.  Gastrointestinal: Negative for abdominal pain and diarrhea.  Endocrine: Negative for polydipsia and polyphagia.  Skin: Negative for color change.  Neurological: Negative for dizziness and weakness.  Psychiatric/Behavioral: Negative for behavioral problems and confusion.       Objective:   Physical Exam Today's visit was via telephone Physical exam was not possible for this visit        Assessment & Plan:  1. Migraine without aura and without status migrainosus, not intractable Migraines under good control propranolol doing a good job has 1/month  2. Postoperative hypothyroidism Patient is on thyroid medication recent TSH looked very good continue current dosing follow-up in 6 months  3. Multiple sclerosis ([place]) MS had a flareup she was in the hospital with that she is seen a specialist at [facility] they are having her on medication   Anxiety continue current medicine denies being depressed

## 2018-12-28 ENCOUNTER — Other Ambulatory Visit: Payer: Self-pay | Admitting: Family Medicine

## 2018-12-30 ENCOUNTER — Other Ambulatory Visit: Payer: Self-pay | Admitting: Family Medicine

## 2019-02-02 ENCOUNTER — Other Ambulatory Visit: Payer: Self-pay

## 2019-02-02 DIAGNOSIS — Z20822 Contact with and (suspected) exposure to covid-19: Secondary | ICD-10-CM

## 2019-02-04 LAB — NOVEL CORONAVIRUS, NAA: SARS-CoV-2, NAA: NOT DETECTED

## 2019-04-20 ENCOUNTER — Other Ambulatory Visit: Payer: Self-pay

## 2019-04-20 ENCOUNTER — Ambulatory Visit: Payer: 59 | Attending: Internal Medicine

## 2019-04-20 DIAGNOSIS — Z20822 Contact with and (suspected) exposure to covid-19: Secondary | ICD-10-CM

## 2019-04-22 LAB — NOVEL CORONAVIRUS, NAA: SARS-CoV-2, NAA: NOT DETECTED

## 2019-04-28 ENCOUNTER — Other Ambulatory Visit: Payer: Self-pay | Admitting: Family Medicine

## 2019-04-30 NOTE — Telephone Encounter (Signed)
May have 90-day refill on each but needs follow-up visit virtual or in person within the next 90 days

## 2019-04-30 NOTE — Telephone Encounter (Signed)
Scheduled 3/22

## 2019-04-30 NOTE — Telephone Encounter (Signed)
Please schedule and then route back to send in refills 

## 2019-04-30 NOTE — Telephone Encounter (Signed)
Appears to be duplicates May have [id number] on each med Will need virtual follow-up for in person follow-up within 90 days

## 2019-06-08 ENCOUNTER — Telehealth: Payer: Self-pay | Admitting: Family Medicine

## 2019-06-08 NOTE — Telephone Encounter (Signed)
Tried to call no answer

## 2019-06-08 NOTE — Telephone Encounter (Signed)
Patient states insurance will no longer cover extended release on propranolol 80 mg. She would like the regular sent into [facility] for 90 day supply last seen 12/19/2018

## 2019-06-08 NOTE — Telephone Encounter (Signed)
Please talk with the patient let her know that in this situation we would recommend changing the propranolol to 40 mg taken 1 pill twice daily (80 mg at 1 time of the nonextended release would definitely cause side effects) If she is okay with this change go ahead and send in 90-day with 1 additional refill we do recommend follow-up visit in the spring I believe patient already has appointment thank you

## 2019-06-09 NOTE — Telephone Encounter (Signed)
Tried to call no answer. Voicemail is full  

## 2019-06-10 ENCOUNTER — Other Ambulatory Visit: Payer: Self-pay | Admitting: *Deleted

## 2019-06-10 MED ORDER — PROPRANOLOL HCL 40 MG PO TABS: 40.0000 mg | ORAL_TABLET | Freq: Two times a day (BID) | ORAL | 1 refills | Status: DC

## 2019-06-10 NOTE — Telephone Encounter (Signed)
Discussed with pt. Pt verbalized understanding and does want 40mg  one bid sent to [facility]. Med sent to pharm.

## 2019-07-06 ENCOUNTER — Ambulatory Visit (INDEPENDENT_AMBULATORY_CARE_PROVIDER_SITE_OTHER): Payer: 59 | Admitting: Family Medicine

## 2019-07-06 ENCOUNTER — Other Ambulatory Visit: Payer: Self-pay

## 2019-07-06 DIAGNOSIS — E890 Postprocedural hypothyroidism: Secondary | ICD-10-CM

## 2019-07-06 DIAGNOSIS — F419 Anxiety disorder, unspecified: Secondary | ICD-10-CM | POA: Diagnosis not present

## 2019-07-06 MED ORDER — CITALOPRAM HYDROBROMIDE 40 MG PO TABS: 40.0000 mg | ORAL_TABLET | Freq: Every day | ORAL | 1 refills | Status: DC

## 2019-07-06 MED ORDER — LEVOTHYROXINE SODIUM 100 MCG PO TABS: ORAL_TABLET | ORAL | 1 refills | Status: DC

## 2019-07-06 MED ORDER — PROPRANOLOL HCL 40 MG PO TABS: 40.0000 mg | ORAL_TABLET | Freq: Two times a day (BID) | ORAL | 1 refills | Status: DC

## 2019-07-06 NOTE — Progress Notes (Signed)
   Subjective:    Patient ID: Lisa [name], female    DOB: 1980/11/11, 39 y.o.   MRN: [id number]  HPImed check up. Pt states she sleeps about one and a half hours a night.  Patient denies depression anxiety finds herself feeling stressed at times partly this is related to the stress of going through child custody issues Takes celexa for anxiety. Feels like it worked better when she first started taking it but also feels like she is having more anxiety since finding out she had anxiety. Gad 7 done.  GAD 7 : Generalized Anxiety Score 07/06/2019 04/14/2018 02/11/2018  Nervous, Anxious, on Edge 3 1 3   Control/stop worrying 3 1 3   Worry too much - different things 3 1 3   Trouble relaxing 3 1 3   Restless 1 1 3   Easily annoyed or irritable 3 1 3   Afraid - awful might happen 0 1 3  Total GAD 7 Score 16 7 21   Anxiety Difficulty Somewhat difficult Somewhat difficult Extremely difficult     Virtual Visit via Telephone Note  I connected with Lisa [name] on 07/06/19 at  2:00 PM EDT by telephone and verified that I am speaking with the correct person using two identifiers.  Location: Patient: home Provider: office   I discussed the limitations, risks, security and privacy concerns of performing an evaluation and management service by telephone and the availability of in person appointments. I also discussed with the patient that there may be a patient responsible charge related to this service. The patient expressed understanding and agreed to proceed.   History of Present Illness:    Observations/Objective:   Assessment and Plan:   Follow Up Instructions:    I discussed the assessment and treatment plan with the patient. The patient was provided an opportunity to ask questions and all were answered. The patient agreed with the plan and demonstrated an understanding of the instructions.   The patient was advised to call back or seek an in-person evaluation if the  symptoms worsen or if the condition fails to improve as anticipated.  I provided 20 minutes of non-face-to-face time during this encounter.       Review of Systems     Objective:   Physical Exam  Virtual unable to do exam      Assessment & Plan:  1. Postoperative hypothyroidism We will check TSH free T4 we will go ahead of refills on the medication.  Follow-up 6 months  2. Anxiety Under a lot of stress because of custody cases with her adopted children but otherwise patient is doing well refills on medication given  Patient not sleeping a lot lately mainly because of her husband snores she will encourage him to get a sleep study if not a sleep study then she will encourage him to consider seeing ENT we will be happy to help when he is ready to do some

## 2019-10-29 IMAGING — MR MRI THORACIC SPINE WITHOUT AND WITH CONTRAST
5 of 9 series · 22 of 48 positions shown · IV contrast (gadavist)
Comparison: Prior brain MRI from 05/02/2018.

CLINICAL DATA: Initial evaluation for right thigh numbness for 10
days, low back pain. History of probable multiple sclerosis.

EXAM:
MRI THORACIC AND LUMBAR SPINE WITHOUT AND WITH CONTRAST
TECHNIQUE: Multiplanar and multiecho pulse sequences of the thoracic and lumbar
spine were obtained without and with intravenous contrast.
CONTRAST:  10 cc of Gadavist.

[Series 14: T1 · sagittal · 6.0mm · 1.23mm/px · 1 of 9 slices shown (1 of 3)]
[im 1/9]
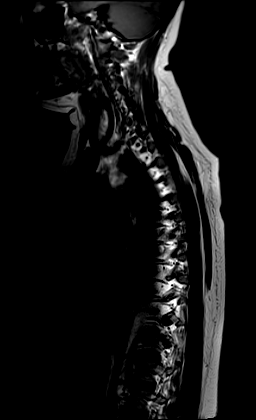

[Series 15: T2 · sagittal · 3.0mm · 0.76mm/px · 3 of 17 slices shown (1 of 2)]
[im 1/17]
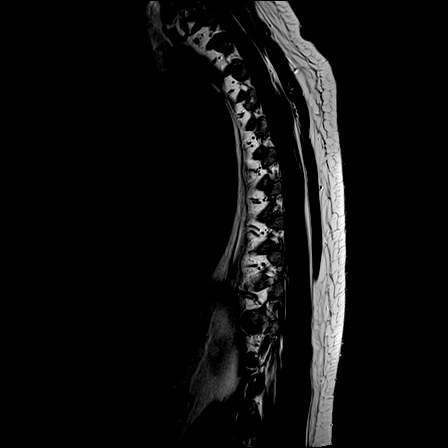
[im 9/17]
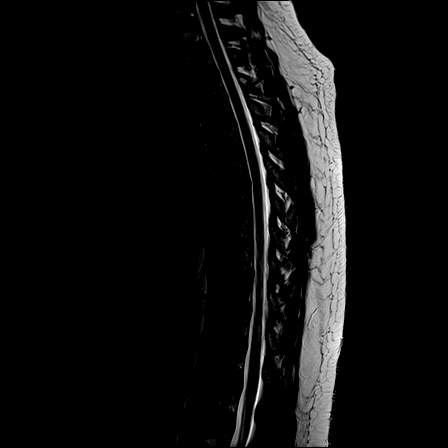
[im 17/17]
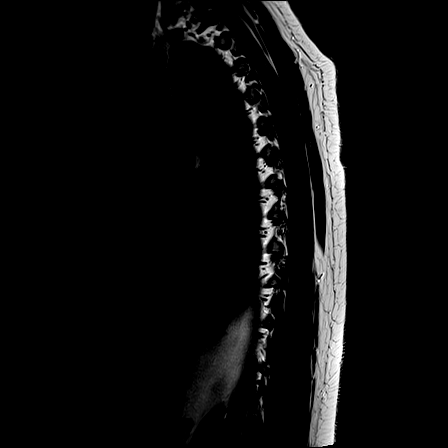

[Series 16: T1 · sagittal · 3.0mm · 0.76mm/px · 4 of 17 slices shown (2 of 3)]
[im 1/17]
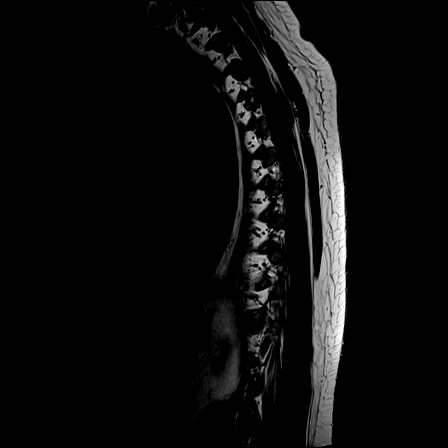
[im 6/17]
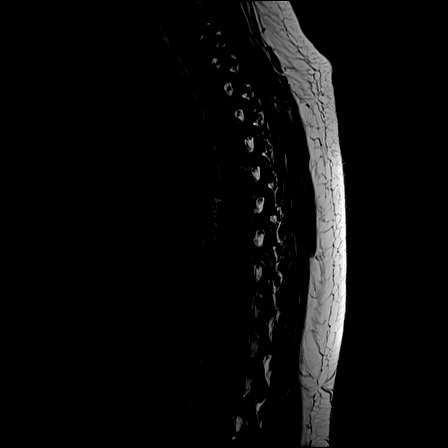
[im 11/17]
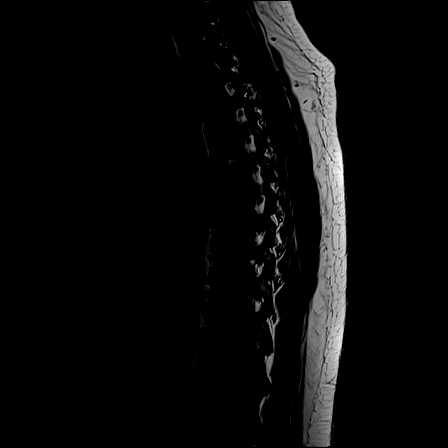
[im 17/17]
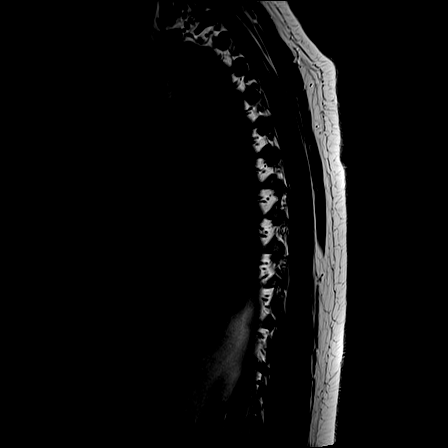

[Series 18: T2 · axial · 4.0mm · 0.59mm/px · z∈[-152,+90]mm · 8 of 39 slices shown (2 of 2)]
[im 1/39]
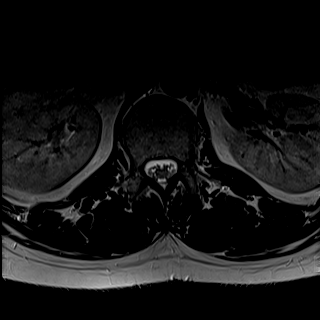
[im 6/39]
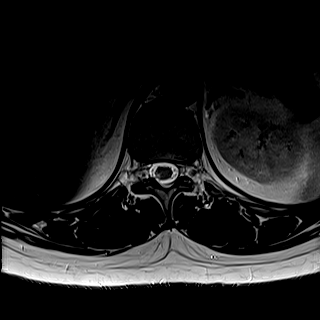
[im 11/39]
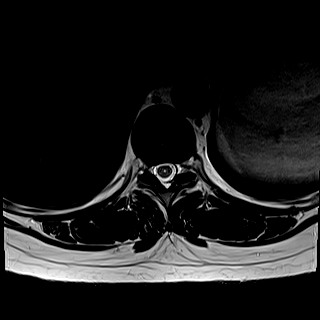
[im 17/39]
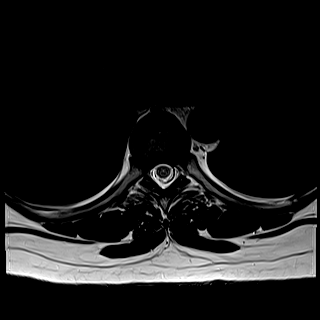
[im 22/39]
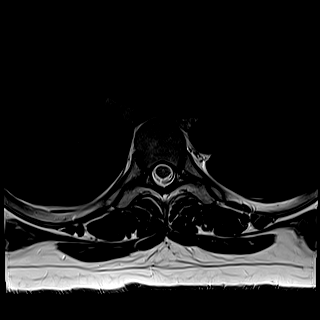
[im 28/39]
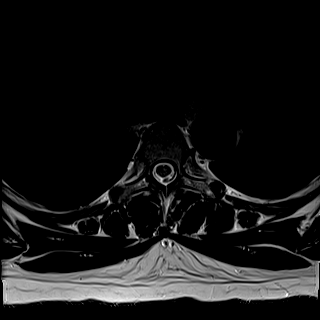
[im 33/39]
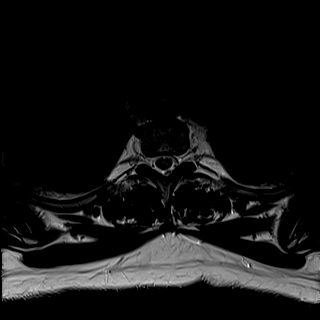
[im 39/39]
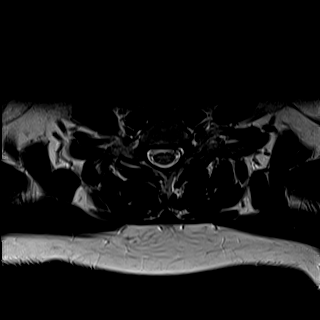

[Series 20: T1 · axial · non-contrast · 4.0mm · 0.31mm/px · z∈[-152,+27]mm · 6 of 39 slices shown (3 of 3)]
[im 1/39]
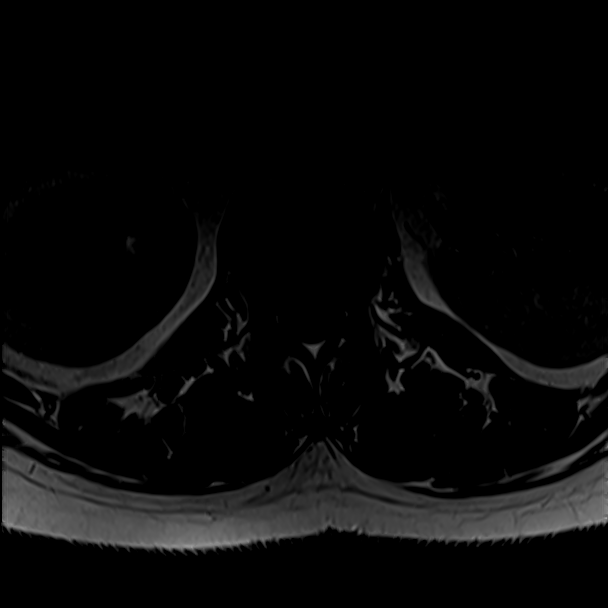
[im 6/39]
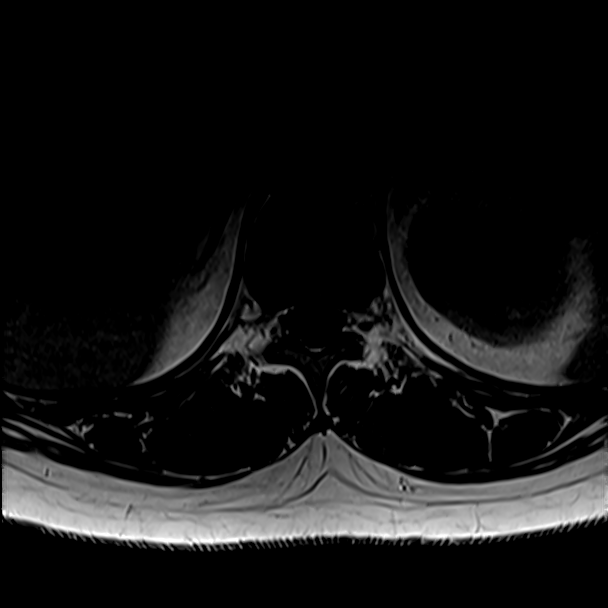
[im 11/39]
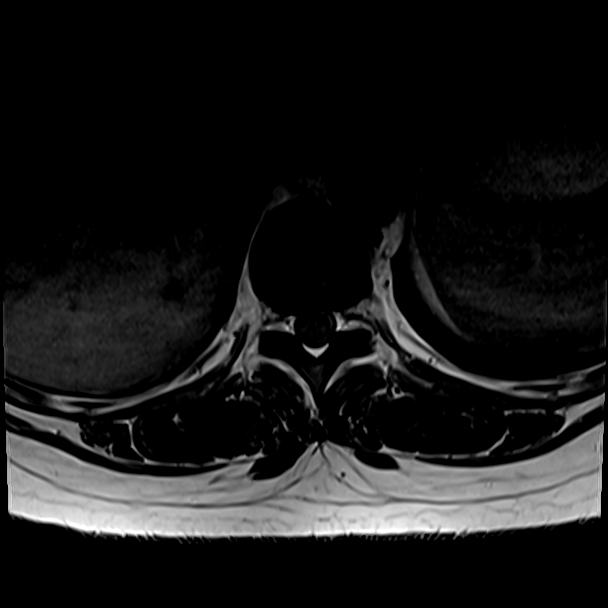
[im 17/39]
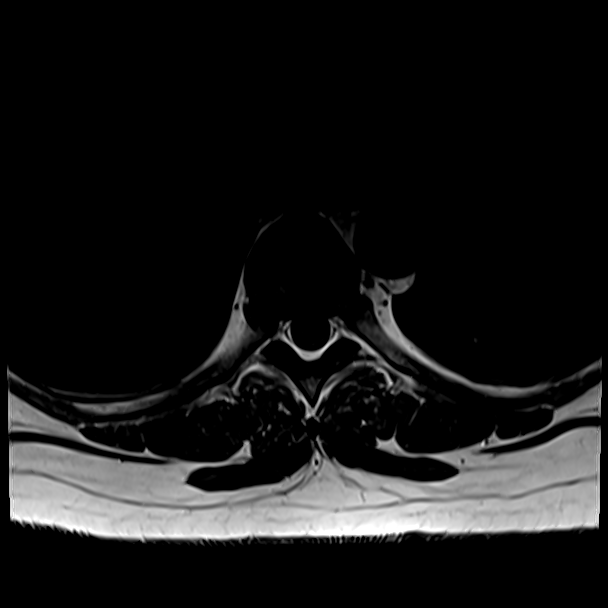
[im 22/39]
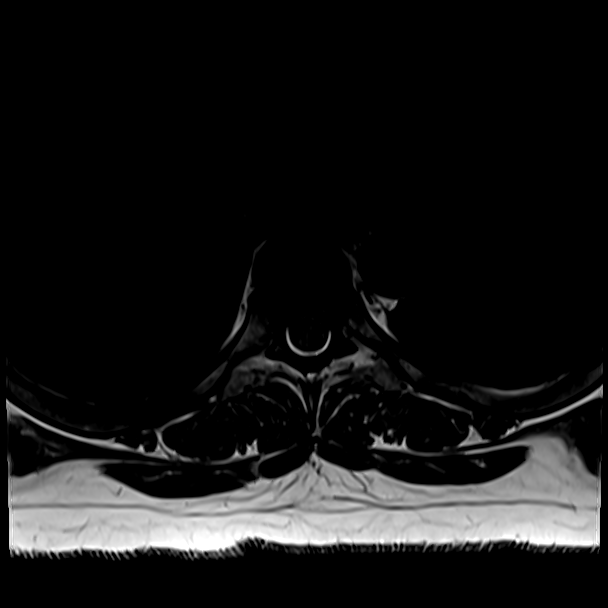
[im 28/39]
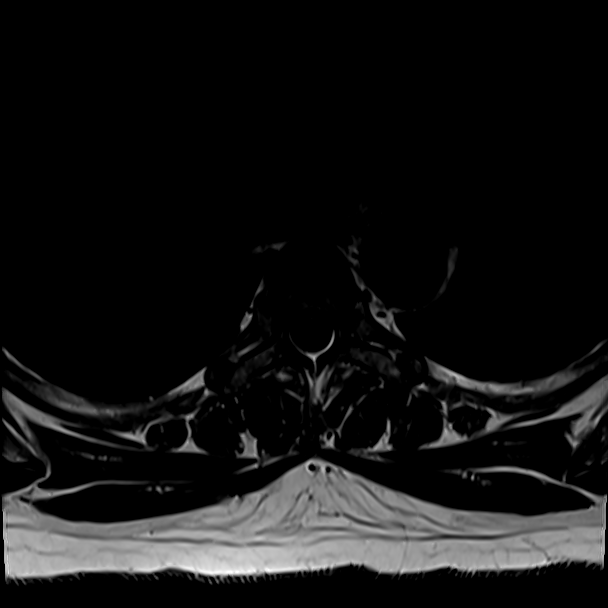

[22 of 48 positions shown; findings below may reference images not displayed]

FINDINGS: MRI THORACIC SPINE FINDINGS

Alignment: Vertebral bodies normally aligned with preservation of
the normal thoracic kyphosis. No listhesis or malalignment.

Vertebrae: Vertebral body height maintained without evidence for
acute or chronic fracture. Bone marrow signal intensity within
normal limits. Subcentimeter benign hemangioma noted within the L2
vertebral body. No other discrete or worrisome osseous lesions. No
abnormal marrow edema or enhancement.

Cord: Mild prominence of the central canal at the level of T9
without frank syrinx formation. Patchy signal abnormality within the
left hemi cord at the level of T8-9 (series 18, image 26). Patchy
signal abnormality within the central and dorsal aspect of the cord
at the level of T10-11 (series 18, image 31). Additional signal
abnormality within the left posterior cord at the level of T10-11
(series 18, image 32). Additional signal abnormality within the
right posterior cord at the level of T11-12 (a series 18, image 34).
There is suggestion of faint patchy enhancement about this lesion
involving the right hemi cord at the level of T11-12, suspicious for
possible active demyelination (series 21, image 9 on sagittal
sequence, series 22, image 34 on axial sequence). No other definite
abnormal enhancement to suggest active demyelination.

Paraspinal and other soft tissues: Paraspinous soft tissues within
normal limits. Partially visualized lungs are clear. Single
approximate 1 cm T2 hyperintense simple cyst noted within the left
kidney. Visualized visceral structures otherwise unremarkable.

Disc levels:

Minimal scattered degenerative disc bulging noted at T4-5, T6-7,
T9-10, T10-11, and T12-L1. No focal disc herniation. No significant
canal or foraminal stenosis.

MRI LUMBAR SPINE FINDINGS

Segmentation: Transitional lumbosacral anatomy with partial
sacralization of the L5 vertebral body. L5-S1 disc somewhat
rudimentary.

Alignment: Physiologic with preservation of the normal lumbar
lordosis. No listhesis.

Vertebrae: Vertebral body height maintained without evidence for
acute or chronic fracture. Bone marrow signal intensity within
normal limits. Few scattered subcentimeter benign hemangiomas noted.
No other discrete or worrisome osseous lesions. No abnormal marrow
edema or enhancement.

Conus medullaris: Extends to the L1 level and appears normal.

Paraspinal and other soft tissues: Paraspinous soft tissues within
normal limits. Visualized visceral structures unremarkable.

Disc levels:

L1-2: Mild annular disc bulge. No canal or foraminal stenosis. No
impingement.

L2-3: Mild annular disc bulge. Mild bilateral facet hypertrophy. No
canal or foraminal stenosis. No impingement.

L3-4: Mild circumferential disc bulge. Mild facet hypertrophy. No
canal or neural foraminal stenosis. No impingement.

L4-5: Negative interspace. Mild facet hypertrophy. No canal or
neural foraminal stenosis. No impingement.

L5-S1: Transitional lumbosacral anatomy with partial sacralization
of the L5 vertebral body. L5-S1 disc somewhat rudimentary. No disc
bulge or disc protrusion. Middle facet degeneration. No canal or
foraminal stenosis. No impingement.
IMPRESSION: MRI THORACIC SPINE IMPRESSION:

1. Patchy multifocal cord signal abnormality involving the lower
thoracic spine as above, suspicious for demyelinating disease,
multiple sclerosis. Subtle patchy enhancement about a lesion
involving the right posterior cord at the level of T11-12 consistent
with active demyelination.
2. Minor multilevel degenerative spondylolysis without significant
stenosis or neural impingement.

MRI LUMBAR SPINE IMPRESSION:

1. No acute abnormality within the lumbar spine.
2. Mild noncompressive disc bulging at L1-2 through L3-4 without
significant stenosis or impingement.
3. Mild bilateral facet hypertrophy at L2-3 through L4-5, which
could contribute to lower back pain.
4. Transitional lumbosacral anatomy.

## 2019-12-29 ENCOUNTER — Telehealth: Payer: Self-pay | Admitting: Family Medicine

## 2019-12-29 ENCOUNTER — Other Ambulatory Visit: Payer: Self-pay

## 2019-12-29 ENCOUNTER — Other Ambulatory Visit: Payer: Self-pay | Admitting: Critical Care Medicine

## 2019-12-29 ENCOUNTER — Ambulatory Visit (INDEPENDENT_AMBULATORY_CARE_PROVIDER_SITE_OTHER): Payer: 59 | Admitting: Family Medicine

## 2019-12-29 DIAGNOSIS — U071 COVID-19: Secondary | ICD-10-CM | POA: Diagnosis not present

## 2019-12-29 DIAGNOSIS — G35 Multiple sclerosis: Secondary | ICD-10-CM

## 2019-12-29 DIAGNOSIS — R0602 Shortness of breath: Secondary | ICD-10-CM | POA: Diagnosis not present

## 2019-12-29 MED ORDER — ALBUTEROL SULFATE HFA 108 (90 BASE) MCG/ACT IN AERS: 2.0000 | INHALATION_SPRAY | RESPIRATORY_TRACT | 2 refills | Status: DC | PRN

## 2019-12-29 MED ORDER — ONDANSETRON HCL 8 MG PO TABS: 8.0000 mg | ORAL_TABLET | Freq: Three times a day (TID) | ORAL | 2 refills | Status: DC | PRN

## 2019-12-29 NOTE — Progress Notes (Signed)
   Subjective:    Patient ID: Lisa [name], female    DOB: 12-22-1980, 39 y.o.   MRN: [id number]  HPIhusband positive for covid and pt took a home test that was positive. Having some shortness of breath when lying and sitting.  Patient with significant fatigue tiredness and discomfort gets a little out of breath when she lays down or when she moves around denies vomiting but relates nausea energy level is low she has underlying MS   Review of Systems Please see above    Objective:   Physical Exam  Lungs clear respiratory normal heart is tachycardic in the 120s to 130s with O2 saturation 98%      Assessment & Plan:  Covid Infusion tomorrow as already scheduled Monitor O2 sats closely if they drop into the 80s go to the ER If passing out spells or worsening spells go to ER Will send in medication for nausea Patient has dry type cough we will send in albuterol as well

## 2019-12-29 NOTE — Progress Notes (Signed)
I connected by phone with Lisa [name] on 12/29/2019 at 11:34 AM to discuss the potential use of a new treatment for mild to moderate COVID-19 viral infection in non-hospitalized patients.  This patient is a 39 y.o. female that meets the FDA criteria for Emergency Use Authorization of COVID monoclonal antibody casirivimab/imdevimab.  Has a (+) direct SARS-CoV-2 viral test result  Has mild or moderate COVID-19   Is NOT hospitalized due to COVID-19  Is within 10 days of symptom onset  Has at least one of the high risk factor(s) for progression to severe COVID-19 and/or hospitalization as defined in EUA.  Specific high risk criteria : Immunosuppressive Disease or Treatment, obesity   I have spoken and communicated the following to the patient or parent/caregiver regarding COVID monoclonal antibody treatment:  1. FDA has authorized the emergency use for the treatment of mild to moderate COVID-19 in adults and pediatric patients with positive results of direct SARS-CoV-2 viral testing who are 58 years of age and older weighing at least 40 kg, and who are at high risk for progressing to severe COVID-19 and/or hospitalization.  2. The significant known and potential risks and benefits of COVID monoclonal antibody, and the extent to which such potential risks and benefits are unknown.  3. Information on available alternative treatments and the risks and benefits of those alternatives, including clinical trials.  4. Patients treated with COVID monoclonal antibody should continue to self-isolate and use infection control measures (e.g., wear mask, isolate, social distance, avoid sharing personal items, clean and disinfect "high touch" surfaces, and frequent handwashing) according to CDC guidelines.   5. The patient or parent/caregiver has the option to accept or refuse COVID monoclonal antibody treatment.  After reviewing this information with the patient, The patient agreed to proceed  with receiving casirivimab\imdevimab infusion and will be provided a copy of the Fact sheet prior to receiving the infusion. [name] 12/29/2019 11:34 AM

## 2019-12-29 NOTE — Telephone Encounter (Signed)
Called pt and she took home covid test that was positive and her husband is positive. Today started having some breathing issues. O2 is around 91, 92. States she is on a med for MS that lowers her immune system. Scheduled a car visit for pt today. And I called the covid antibiody infusion hotline and gave pt information to them on voicemail.

## 2019-12-29 NOTE — Telephone Encounter (Signed)
Patient is wanting to go ahead with blood infusion that you spoke of at visit yesterday.

## 2019-12-30 ENCOUNTER — Ambulatory Visit (HOSPITAL_COMMUNITY)
Admission: RE | Admit: 2019-12-30 | Discharge: 2019-12-30 | Disposition: A | Discharge status: Home / Self Care | Payer: 59 | Source: Ambulatory Visit | Attending: Pulmonary Disease | Admitting: Pulmonary Disease

## 2019-12-30 DIAGNOSIS — U071 COVID-19: Secondary | ICD-10-CM | POA: Insufficient documentation

## 2019-12-30 DIAGNOSIS — G35 Multiple sclerosis: Secondary | ICD-10-CM

## 2019-12-30 MED ORDER — ALBUTEROL SULFATE HFA 108 (90 BASE) MCG/ACT IN AERS: 2.0000 | INHALATION_SPRAY | Freq: Once | RESPIRATORY_TRACT | Status: DC | PRN

## 2019-12-30 MED ORDER — SODIUM CHLORIDE 0.9 % IV SOLN: INTRAVENOUS | Status: DC | PRN

## 2019-12-30 MED ORDER — EPINEPHRINE 0.3 MG/0.3ML IJ SOAJ: 0.3000 mg | Freq: Once | INTRAMUSCULAR | Status: DC | PRN

## 2019-12-30 MED ORDER — DIPHENHYDRAMINE HCL 50 MG/ML IJ SOLN: 50.0000 mg | Freq: Once | INTRAMUSCULAR | Status: DC | PRN

## 2019-12-30 MED ORDER — METHYLPREDNISOLONE SODIUM SUCC 125 MG IJ SOLR: 125.0000 mg | Freq: Once | INTRAMUSCULAR | Status: DC | PRN

## 2019-12-30 MED ORDER — FAMOTIDINE IN NACL 20-0.9 MG/50ML-% IV SOLN: 20.0000 mg | Freq: Once | INTRAVENOUS | Status: DC | PRN

## 2019-12-30 MED ORDER — SODIUM CHLORIDE 0.9 % IV SOLN
1200.0000 mg | Freq: Once | INTRAVENOUS | Status: AC
  Administered 2019-12-30: 1200 mg via INTRAVENOUS
  Filled 2019-12-30: qty 10

## 2019-12-30 NOTE — Progress Notes (Signed)
  Diagnosis: COVID-19  Physician: Dr. Wright  Procedure: Covid Infusion Clinic Med: casirivimab\imdevimab infusion - Provided patient with casirivimab\imdevimab fact sheet for patients, parents and caregivers prior to infusion.  Complications: No immediate complications noted.  Discharge: Discharged home   Kelyse Pask M Annia Gomm 12/30/2019  

## 2019-12-30 NOTE — Discharge Instructions (Signed)

## 2020-01-01 ENCOUNTER — Other Ambulatory Visit (HOSPITAL_COMMUNITY): Payer: Self-pay

## 2020-01-20 ENCOUNTER — Telehealth: Payer: Self-pay

## 2020-01-20 NOTE — Telephone Encounter (Signed)
No immunization record in [place] or [place]. Pt contacted and verbalized understanding. Informed patient that she would need to have an office visit to get tetanus shot; pt declines. Pt states she has a full day today and unable to schedule appt. Pt given information about health department; pt states she will call health department.

## 2020-01-20 NOTE — Telephone Encounter (Signed)
Pt would like to know if tetanus is up to date. Pt states she stepped on a nail last night in the yard. She was able to get nail out.

## 2020-02-26 ENCOUNTER — Other Ambulatory Visit: Payer: Self-pay | Admitting: Family Medicine

## 2020-03-28 ENCOUNTER — Encounter: Payer: Self-pay | Admitting: Family Medicine

## 2020-03-28 ENCOUNTER — Ambulatory Visit (INDEPENDENT_AMBULATORY_CARE_PROVIDER_SITE_OTHER): Payer: 59 | Admitting: Family Medicine

## 2020-03-28 ENCOUNTER — Other Ambulatory Visit: Payer: Self-pay

## 2020-03-28 VITALS — BP 128/82 | HR 87 | Temp 97.0°F | Ht 69.0 in | Wt 236.2 lb

## 2020-03-28 DIAGNOSIS — F411 Generalized anxiety disorder: Secondary | ICD-10-CM

## 2020-03-28 NOTE — Progress Notes (Signed)
Patient ID: Lisa [name], female    DOB: 1980/05/25, 39 y.o.   MRN: [id number]   Chief Complaint  Patient presents with  . Annual Exam    Needs forms filled out for foster care - gets physical thru GYN   Subjective:  CC: needs paperwork for foster care  Presents today for paperwork for foster care agency.  Her last physical exam was November 02, 2019 she gets her physical exam and her Pap smear through GYN.  She is managed by this office for her anxiety/depression her PHQ-9 score today is a 1.  She is also on migraine prophylaxis, Synthroid for hypothyroidism and she sees neurology for her multiple sclerosis, which is well managed.  She is not at risk for tuberculosis infection and there is no evidence of any communicable disease present today.    Medical History Rut has a past medical history of Anxiety, Headache(784.0), Hypertension, Neuromuscular disorder ([place]), PONV (postoperative nausea and vomiting), and Thyroid nodule.   Outpatient Encounter Medications as of 03/28/2020  Medication Sig  . albuterol (VENTOLIN HFA) 108 (90 Base) MCG/ACT inhaler Inhale 2 puffs into the lungs every 4 (four) hours as needed for wheezing.  . citalopram (CELEXA) 40 MG tablet TAKE 1 TABLET BY MOUTH  DAILY  . levothyroxine (SYNTHROID) 100 MCG tablet TAKE 1 TABLET BY MOUTH  DAILY BEFORE BREAKFAST  . ondansetron (ZOFRAN) 8 MG tablet Take 1 tablet (8 mg total) by mouth every 8 (eight) hours as needed for nausea.  Marland Kitchen PRESCRIPTION MEDICATION ocrevus infections twice yearly  . propranolol (INDERAL) 40 MG tablet TAKE 1 TABLET BY MOUTH  TWICE DAILY   No facility-administered encounter medications on file as of 03/28/2020.     Review of Systems  Constitutional: Negative for chills and fever.  HENT: Negative for ear pain.   Respiratory: Negative for shortness of breath.   Cardiovascular: Negative for chest pain.  Gastrointestinal: Negative for abdominal pain.     Vitals BP 128/82   Pulse 87    Temp (!) 97 F (36.1 C) (Oral)   Ht 5\' 9"  (1.753 m)   Wt 236 lb 3.2 oz (107.1 kg)   SpO2 99%   BMI 34.88 kg/m   Objective:   Physical Exam Vitals reviewed.  Constitutional:      Appearance: Normal appearance.  Cardiovascular:     Rate and Rhythm: Normal rate and regular rhythm.     Heart sounds: Normal heart sounds.  Pulmonary:     Effort: Pulmonary effort is normal.     Breath sounds: Normal breath sounds.  Skin:    General: Skin is warm and dry.  Neurological:     General: No focal deficit present.     Mental Status: She is alert.  Psychiatric:        Behavior: Behavior normal.      Assessment and Plan   1. Generalized anxiety disorder   PHQ-9 score today is 1.  She is well controlled on her Celexa for her anxiety/depression.  She is a foster parent, needed paperwork filled out today by her PCP for the foster care agency.  She has no concerns, no medication refill needs, anxiety well controlled, and her hypothyroidism well managed.  Based on the questionnaire from the foster agency, there is no need for tuberculosis screening today.  She sees neurology for her multiple sclerosis, this is well managed she gets infusions twice per year.  Agrees with plan of care discussed today. Understands warning signs to seek further  care: Chest pain, shortness of breath, any significant change in health. Understands to follow-up in February with Dr. Nicki Reaper for medication management.   Pecolia Ades, FNP-C 03/28/2020

## 2020-05-17 ENCOUNTER — Other Ambulatory Visit: Payer: Self-pay | Admitting: Family Medicine

## 2020-05-18 NOTE — Telephone Encounter (Signed)
90-day with 1 refill 

## 2020-05-30 ENCOUNTER — Ambulatory Visit: Payer: 59 | Admitting: Family Medicine

## 2020-06-26 DIAGNOSIS — J189 Pneumonia, unspecified organism: Secondary | ICD-10-CM

## 2020-06-26 HISTORY — DX: Pneumonia, unspecified organism: J18.9

## 2020-07-05 ENCOUNTER — Ambulatory Visit (INDEPENDENT_AMBULATORY_CARE_PROVIDER_SITE_OTHER): Payer: 59 | Admitting: Family Medicine

## 2020-07-05 ENCOUNTER — Other Ambulatory Visit: Payer: Self-pay

## 2020-07-05 ENCOUNTER — Encounter: Payer: Self-pay | Admitting: Family Medicine

## 2020-07-05 ENCOUNTER — Ambulatory Visit (HOSPITAL_COMMUNITY)
Admission: RE | Admit: 2020-07-05 | Discharge: 2020-07-05 | Disposition: A | Discharge status: Home / Self Care | Payer: 59 | Source: Ambulatory Visit | Attending: Family Medicine | Admitting: Family Medicine

## 2020-07-05 VITALS — HR 69 | Temp 98.4°F | Resp 16

## 2020-07-05 DIAGNOSIS — Z8701 Personal history of pneumonia (recurrent): Secondary | ICD-10-CM

## 2020-07-05 DIAGNOSIS — J189 Pneumonia, unspecified organism: Secondary | ICD-10-CM | POA: Diagnosis present

## 2020-07-05 DIAGNOSIS — R0602 Shortness of breath: Secondary | ICD-10-CM

## 2020-07-05 MED ORDER — FLOVENT HFA 44 MCG/ACT IN AERO: 2.0000 | INHALATION_SPRAY | Freq: Two times a day (BID) | RESPIRATORY_TRACT | 1 refills | Status: DC

## 2020-07-05 NOTE — Progress Notes (Signed)
Patient ID: Lisa [name], female    DOB: [birth year], 40 y.o.   MRN: [id number]   Chief Complaint  Patient presents with  . Pneumonia    Was dx 10 days ago and had 2 rounds of antibiotics and still SOB   Subjective:  CC: shortness of breath s/p pneumonia  This is not a new problem.  Presents today for an acute visit with a complaint of feels like something is sitting on the chest, fatigue, coughing which is worse at night.  Was diagnosed with pneumonia on March 13 at an urgent care, prescribed albuterol and azithromycin and doxycycline.  Has completed both antibiotics, still complains of shortness of breath with exertion.  Reports that when she is involved in any activity she has to sit down to recover.  She has taken 2 Covid test which have both been negative.  Reports that she is also having a sensation of choking on her food which started at the end of February.  Denies fever chills, endorses fatigue choking sensation chest pain with deep inspiration and shortness of breath.  She is able to converse throughout interview without obvious shortness of breath.    Medical History Ellise has a past medical history of Anxiety, Headache(784.0), Hypertension, Neuromuscular disorder ([place]), PONV (postoperative nausea and vomiting), and Thyroid nodule.   Outpatient Encounter Medications as of 07/05/2020  Medication Sig  . fluticasone (FLOVENT HFA) 44 MCG/ACT inhaler Inhale 2 puffs into the lungs 2 (two) times daily.  Marland Kitchen albuterol (VENTOLIN HFA) 108 (90 Base) MCG/ACT inhaler Inhale 2 puffs into the lungs every 4 (four) hours as needed for wheezing.  . citalopram (CELEXA) 40 MG tablet TAKE 1 TABLET BY MOUTH  DAILY  . levothyroxine (SYNTHROID) 100 MCG tablet TAKE 1 TABLET BY MOUTH  DAILY BEFORE BREAKFAST  . ondansetron (ZOFRAN) 8 MG tablet Take 1 tablet (8 mg total) by mouth every 8 (eight) hours as needed for nausea.  Marland Kitchen PRESCRIPTION MEDICATION ocrevus infections twice yearly  . propranolol  (INDERAL) 40 MG tablet TAKE 1 TABLET BY MOUTH  TWICE DAILY   No facility-administered encounter medications on file as of 07/05/2020.     Review of Systems  Constitutional: Positive for fatigue. Negative for chills and fever.  HENT: Negative for congestion.   Respiratory: Positive for cough, choking, chest tightness and shortness of breath.        Feels like choking on food.  Cardiovascular: Positive for chest pain.       Pain with deep inspiration.   Gastrointestinal: Negative for abdominal pain.  Neurological: Negative for headaches.     Vitals Pulse 69   Temp 98.4 F (36.9 C)   Resp 16   SpO2 98%   Objective:   Physical Exam Vitals reviewed.  Constitutional:      Appearance: Normal appearance.  HENT:     Right Ear: Tympanic membrane normal.     Left Ear: Tympanic membrane normal.     Nose: Nose normal.     Mouth/Throat:     Mouth: Mucous membranes are moist.  Cardiovascular:     Rate and Rhythm: Normal rate and regular rhythm.     Heart sounds: Normal heart sounds.  Pulmonary:     Effort: Pulmonary effort is normal.     Breath sounds: Normal breath sounds.     Comments: Breath sounds clear, slightly diminished in left bases. Skin:    General: Skin is warm and dry.  Neurological:     General: No focal deficit  present.     Mental Status: She is alert.  Psychiatric:        Behavior: Behavior normal.      Assessment and Plan   1. SOB (shortness of breath) - DG Chest 2 View - fluticasone (FLOVENT HFA) 44 MCG/ACT inhaler; Inhale 2 puffs into the lungs 2 (two) times daily.  Dispense: 1 each; Refill: 1  2. History of pneumonia - DG Chest 2 View   Will send for chest x-ray to assess lung status.  She has a history of multiple sclerosis, cannot take oral steroids, will call neurology to see if inhaled steroids are contraindicated.  No acute distress noted in office today, able to converse throughout visit without obvious shortness of breath.  Oxygen  saturation 98%.  Update: Chest x-ray negative.  Stephenia notified; neurology reports okay to use ICS (per Benjamine Mola),  will order Flovent for chest tightness.  Antibiotic not needed, no pneumonia.  Agrees with plan of care discussed today. Understands warning signs to seek further care: chest pain, shortness of breath, any significant change in health.  Understands to follow-up if symptoms do not improve, or worsen.  Will notify of chest x-ray results later today once they become available.  will order antibiotic if needed and inhaled corticosteroid for her lung symptoms, if not contraindicated per neurology.  Pneumonia not found on chest x-ray, no antibiotics needed.  If symptoms do not improve, will send to gastroenterologist for the sensation of choking with food.    Pecolia Ades, NP 07/05/20

## 2020-07-06 ENCOUNTER — Telehealth: Payer: Self-pay | Admitting: *Deleted

## 2020-07-06 NOTE — Telephone Encounter (Signed)
Per Santiago Glad NP:  Please call and check on her today. I would like to know if the inhaler has made a difference in her symptoms.

## 2020-07-06 NOTE — Telephone Encounter (Signed)
Patient stated the inhaler has made her cough more which she feels is loosening things up- she states she is feeling fine

## 2020-09-22 ENCOUNTER — Other Ambulatory Visit: Payer: Self-pay

## 2020-09-22 ENCOUNTER — Encounter (HOSPITAL_COMMUNITY): Payer: Self-pay

## 2020-09-22 ENCOUNTER — Emergency Department (HOSPITAL_COMMUNITY)
Admission: EM | Admit: 2020-09-22 | Discharge: 2020-09-22 | Disposition: A | Discharge status: Home / Self Care | Payer: 59 | Attending: Emergency Medicine | Admitting: Emergency Medicine

## 2020-09-22 DIAGNOSIS — R202 Paresthesia of skin: Secondary | ICD-10-CM | POA: Diagnosis not present

## 2020-09-22 DIAGNOSIS — I10 Essential (primary) hypertension: Secondary | ICD-10-CM | POA: Diagnosis not present

## 2020-09-22 DIAGNOSIS — Z87891 Personal history of nicotine dependence: Secondary | ICD-10-CM | POA: Diagnosis not present

## 2020-09-22 DIAGNOSIS — G43809 Other migraine, not intractable, without status migrainosus: Secondary | ICD-10-CM | POA: Diagnosis not present

## 2020-09-22 DIAGNOSIS — H538 Other visual disturbances: Secondary | ICD-10-CM | POA: Insufficient documentation

## 2020-09-22 DIAGNOSIS — R519 Headache, unspecified: Secondary | ICD-10-CM | POA: Diagnosis present

## 2020-09-22 LAB — BASIC METABOLIC PANEL
Anion gap: 5 (ref 5–15)
BUN: 10 mg/dL (ref 6–20)
CO2: 25 mmol/L (ref 22–32)
Calcium: 8.4 mg/dL — ABNORMAL LOW (ref 8.9–10.3)
Chloride: 103 mmol/L (ref 98–111)
Creatinine, Ser: 0.6 mg/dL (ref 0.44–1.00)
GFR, Estimated: >60 mL/min (ref >60)
Glucose, Bld: 106 mg/dL — ABNORMAL HIGH (ref 70–99)
Potassium: 3.6 mmol/L (ref 3.5–5.1)
Sodium: 133 mmol/L — ABNORMAL LOW (ref 135–145)

## 2020-09-22 LAB — CBC
HCT: 36.6 % (ref 36.0–46.0)
Hemoglobin: 11.5 g/dL — ABNORMAL LOW (ref 12.0–15.0)
MCH: 27 pg (ref 26.0–34.0)
MCHC: 31.4 g/dL (ref 30.0–36.0)
MCV: 85.9 fL (ref 80.0–100.0)
Platelets: 293 10*3/uL (ref 150–400)
RBC: 4.26 MIL/uL (ref 3.87–5.11)
RDW: 13.8 % (ref 11.5–15.5)
WBC: 7.1 10*3/uL (ref 4.0–10.5)
nRBC: 0 % (ref 0.0–0.2)

## 2020-09-22 MED ORDER — KETOROLAC TROMETHAMINE 15 MG/ML IJ SOLN
15.0000 mg | Freq: Once | INTRAMUSCULAR | Status: AC
  Administered 2020-09-22: 15 mg via INTRAVENOUS
  Filled 2020-09-22: qty 1

## 2020-09-22 MED ORDER — DIPHENHYDRAMINE HCL 50 MG/ML IJ SOLN
25.0000 mg | Freq: Once | INTRAMUSCULAR | Status: AC
  Administered 2020-09-22: 25 mg via INTRAVENOUS
  Filled 2020-09-22: qty 1

## 2020-09-22 MED ORDER — PROCHLORPERAZINE EDISYLATE 10 MG/2ML IJ SOLN
10.0000 mg | Freq: Once | INTRAMUSCULAR | Status: AC
  Administered 2020-09-22: 10 mg via INTRAVENOUS
  Filled 2020-09-22: qty 2

## 2020-09-22 MED ORDER — SODIUM CHLORIDE 0.9 % IV BOLUS
1000.0000 mL | Freq: Once | INTRAVENOUS | Status: AC
  Administered 2020-09-22: 1000 mL via INTRAVENOUS

## 2020-09-22 NOTE — Discharge Instructions (Addendum)
You were evaluated in the Emergency Department and after careful evaluation, we did not find any emergent condition requiring admission or further testing in the hospital.  Your exam/testing today was overall reassuring.  Symptoms seem to be due to a migraine.  We are glad that your symptoms went away here in the emergency department with medications.  Recommend follow-up with your neurologist to tell them about your symptoms.  Please return to the Emergency Department if you experience any worsening of your condition.  Thank you for allowing [place] to be a part of your care.

## 2020-09-22 NOTE — ED Triage Notes (Addendum)
Pov from home with cc of migraine that started around 7pm that had slowly been getting worse. Said that the pain is behind her left eye. Also causing cloudy vision in that eye. Said that she has MS and it making it flare. Has chronic migraines, takes propanolol everyday and hasnt had a migraine in a while   R eye 20/30   L eye 20/40 Both Eyes 20/25

## 2020-09-22 NOTE — ED Provider Notes (Signed)
[facility] Emergency Department Provider Note MRN:  [id number]  Arrival date & time: 09/22/20     Chief Complaint   Migraine   History of Present Illness   Lisa [name] is a 40 y.o. year-old female with a history of MS, migraines presenting to the ED with chief complaint of headache.  Gradual onset headache yesterday afternoon, worsening over the past several hours.  Now noticing some paresthesias to the right arm for the past 4-5 hours.  Has had symptoms like this in the arm with MS flares in the past.  Headache is left-sided, feels like a pressure behind the left eye.  Some blurred vision to the left eye as well.  Headache worse with bright lights and loud noises, moderate to severe.  Denies any other symptoms, no chest pain, no fever.  Review of Systems  A complete 10 system review of systems was obtained and all systems are negative except as noted in the HPI and PMH.   Patient's Health History    Past Medical History:  Diagnosis Date   Anxiety    Headache(784.0)    Hypertension    Neuromuscular disorder ([place])    sus   PONV (postoperative nausea and vomiting)    Thyroid nodule     Past Surgical History:  Procedure Laterality Date   ADENOIDECTOMY     CESAREAN SECTION     CESAREAN SECTION     THYROIDECTOMY  06/18/2011   Procedure: THYROIDECTOMY;  Surgeon: Melida Quitter, MD;  Location: [facility];  Service: ENT;  Laterality: Right;   TONSILLECTOMY     TYMPANOPLASTY      History reviewed. No pertinent family history.  Social History   Socioeconomic History   Marital status: Married    Spouse name: Not on file   Number of children: Not on file   Years of education: Not on file   Highest education level: Not on file  Occupational History   Not on file  Tobacco Use   Smoking status: Former    Pack years: 0.00   Smokeless tobacco: Never  Vaping Use   Vaping Use: Never used  Substance and Sexual Activity   Alcohol use: Yes    Comment:  1/month   Drug use: No   Sexual activity: Yes    Birth control/protection: None  Other Topics Concern   Not on file  Social History Narrative   Not on file   Social Determinants of Health   Financial Resource Strain: Not on file  Food Insecurity: Not on file  Transportation Needs: Not on file  Physical Activity: Not on file  Stress: Not on file  Social Connections: Not on file  Intimate Partner Violence: Not on file     Physical Exam   Vitals:   09/22/20 0500 09/22/20 0530  BP: 103/79 115/72  Pulse: 62 66  Resp:    Temp:    SpO2: 98% 100%    CONSTITUTIONAL: Well-appearing, NAD NEURO:  Alert and oriented x 3, subjective decreased sensation to the right arm, decreased grip strength to right arm EYES:  eyes equal and reactive ENT/NECK:  no LAD, no JVD CARDIO: Regular rate, well-perfused, normal S1 and S2 PULM:  CTAB no wheezing or rhonchi GI/GU:  normal bowel sounds, non-distended, non-tender MSK/SPINE:  No gross deformities, no edema SKIN:  no rash, atraumatic PSYCH:  Appropriate speech and behavior  *Additional and/or pertinent findings included in MDM below  Diagnostic and Interventional Summary    EKG Interpretation  Date/Time:    Ventricular Rate:    PR Interval:    QRS Duration:   QT Interval:    QTC Calculation:   R Axis:     Text Interpretation:          Labs Reviewed  CBC - Abnormal; Notable for the following components:      Result Value   Hemoglobin 11.5 (*)    All other components within normal limits  BASIC METABOLIC PANEL    No orders to display    Medications  ketorolac (TORADOL) 15 MG/ML injection 15 mg (15 mg Intravenous Given 09/22/20 0530)  diphenhydrAMINE (BENADRYL) injection 25 mg (25 mg Intravenous Given 09/22/20 0530)  prochlorperazine (COMPAZINE) injection 10 mg (10 mg Intravenous Given 09/22/20 0530)  sodium chloride 0.9 % bolus 1,000 mL (1,000 mLs Intravenous New Bag/Given 09/22/20 0529)     Procedures  /  Critical  Care Procedures  ED Course and Medical Decision Making  I have reviewed the triage vital signs, the nursing notes, and pertinent available records from the EMR.  Listed above are laboratory and imaging tests that I personally ordered, reviewed, and interpreted and then considered in my medical decision making (see below for details).  Suspect complex migraine versus MS flare.  Highly doubt acute ischemic stroke given patient's lack of risk factors.  These neurological findings on exam were first noticed 4 and half hours ago, unsure when they actually started.  Given the very mild nature of these neurological findings and this timeframe, she is not a candidate for tPA, no indication for code stroke initiation.  Plan is to provide migraine cocktail and reassess.  If all symptoms resolved after migraine cocktail perhaps we can contribute this to a complex migraine and discharge.  If still having lingering neurological complaints or deficits, would need MRI to evaluate for MS flare.     Patient has experienced complete resolution of symptoms after migraine cocktail.  No headache, neurological exam is now normal, normal grip strength, normal sensation bilaterally.  Ambulating without issue.  Appropriate for discharge with close neurology follow-up.  Favoring complex migraine.  Barth Kirks. Sedonia Small, MD [place] [email or web address]  Final Clinical Impressions(s) / ED Diagnoses     ICD-10-CM   1. Other migraine without status migrainosus, not intractable  G43.809       ED Discharge Orders     None        Discharge Instructions Discussed with and Provided to Patient:     Discharge Instructions      You were evaluated in the Emergency Department and after careful evaluation, we did not find any emergent condition requiring admission or further testing in the hospital.  Your exam/testing today was overall reassuring.  Symptoms seem to be due  to a migraine.  We are glad that your symptoms went away here in the emergency department with medications.  Recommend follow-up with your neurologist to tell them about your symptoms.  Please return to the Emergency Department if you experience any worsening of your condition.  Thank you for allowing [place] to be a part of your care.         Maudie Flakes, MD 09/22/20 [phone]

## 2020-09-23 ENCOUNTER — Ambulatory Visit
Admission: EM | Admit: 2020-09-23 | Discharge: 2020-09-23 | Disposition: A | Discharge status: Home / Self Care | Payer: 59 | Attending: Emergency Medicine | Admitting: Emergency Medicine

## 2020-09-23 ENCOUNTER — Encounter: Payer: Self-pay | Admitting: Emergency Medicine

## 2020-09-23 ENCOUNTER — Other Ambulatory Visit: Payer: Self-pay

## 2020-09-23 DIAGNOSIS — G43009 Migraine without aura, not intractable, without status migrainosus: Secondary | ICD-10-CM

## 2020-09-23 MED ORDER — DEXAMETHASONE SODIUM PHOSPHATE 10 MG/ML IJ SOLN
10.0000 mg | Freq: Once | INTRAMUSCULAR | Status: AC
  Administered 2020-09-23: 10 mg via INTRAMUSCULAR

## 2020-09-23 MED ORDER — KETOROLAC TROMETHAMINE 30 MG/ML IJ SOLN
30.0000 mg | Freq: Once | INTRAMUSCULAR | Status: AC
  Administered 2020-09-23: 30 mg via INTRAMUSCULAR

## 2020-09-23 NOTE — Discharge Instructions (Signed)
Unable to rule out stroke in urgent care setting.  Offered patient further evaluation and management in the ED.  Patient declines at this time and would like to try outpatient therapy first.  Aware of the risk associated with this decision including missed diagnosis, organ damage, organ failure, and/or death.  Patient aware and in agreement.     Migraine cocktail given in office Rest and drink plenty of fluids Use OTC medications as needed for symptomatic relief Follow up with PCP if symptoms persists Return or go to the ER if you have any new or worsening symptoms such as fever, chills, nausea, vomiting, chest pain, shortness of breath, cough, vision changes, worsening headache despite treatment, slurred speech, facial asymmetry, weakness in arms or legs, etc..Marland Kitchen

## 2020-09-23 NOTE — ED Provider Notes (Signed)
[facility]   [id number] 09/23/20 Arrival Time: 1911  [id number]  SUBJECTIVE:  MALIE KASHANI is a 40 y.o. female who complains of migraine x 4 days.  Denies a precipitating event, or recent head trauma.  Hx significant for MS, recently started new medication and is suspicious that may be causing her migraines.  Patient localizes her pain to the RT side of head.  Describes the pain as constant and throbbing in character.  Seen in ED and treated with toradol and benadryl with temporary relief.  Requests another migraine cocktail.  Symptoms are made worse with light.  Complains of associated blurred vision, and numbness/ tingling in RT Arm, but states this is typical for her MS.  Patient denies fever, chills, aura, rhinorrhea, watery eyes, chest pain, SOB, abdominal pain, weakness, numbness or tingling, slurred speech.     ROS: As per HPI.  All other pertinent ROS negative.     Past Medical History:  Diagnosis Date   Anxiety    Headache(784.0)    Hypertension    Neuromuscular disorder ([place])    sus   PONV (postoperative nausea and vomiting)    Thyroid nodule    Past Surgical History:  Procedure Laterality Date   ADENOIDECTOMY     CESAREAN SECTION     CESAREAN SECTION     THYROIDECTOMY  06/18/2011   Procedure: THYROIDECTOMY;  Surgeon: Melida Quitter, MD;  Location: [facility];  Service: ENT;  Laterality: Right;   TONSILLECTOMY     TYMPANOPLASTY     Allergies  Allergen Reactions   Sulfa Antibiotics Anaphylaxis   Darvocet [Propoxyphene N-Acetaminophen] Other (See Comments)    Reaction:  Hallucinations    Cephalexin Rash   Imitrex [Sumatriptan Base] Other (See Comments)    Reaction:  Makes pts jaw lock-up   Penicillins Rash and Other (See Comments)    Has patient had a PCN reaction causing immediate rash, facial/tongue/throat swelling, SOB or lightheadedness with hypotension: No Has patient had a PCN reaction causing severe rash involving mucus membranes or skin  necrosis: No Has patient had a PCN reaction that required hospitalization: No Has patient had a PCN reaction occurring within the last 10 years: No If all of the above answers are "NO", then may proceed with Cephalosporin use.   No current facility-administered medications on file prior to encounter.   Current Outpatient Medications on File Prior to Encounter  Medication Sig Dispense Refill   albuterol (VENTOLIN HFA) 108 (90 Base) MCG/ACT inhaler Inhale 2 puffs into the lungs every 4 (four) hours as needed for wheezing. 1 each 2   citalopram (CELEXA) 40 MG tablet TAKE 1 TABLET BY MOUTH  DAILY 90 tablet 1   fluticasone (FLOVENT HFA) 44 MCG/ACT inhaler Inhale 2 puffs into the lungs 2 (two) times daily. 1 each 1   levothyroxine (SYNTHROID) 100 MCG tablet TAKE 1 TABLET BY MOUTH  DAILY BEFORE BREAKFAST 90 tablet 1   ondansetron (ZOFRAN) 8 MG tablet Take 1 tablet (8 mg total) by mouth every 8 (eight) hours as needed for nausea. 20 tablet 2   PRESCRIPTION MEDICATION ocrevus infections twice yearly     propranolol (INDERAL) 40 MG tablet TAKE 1 TABLET BY MOUTH  TWICE DAILY 180 tablet 1   Social History   Socioeconomic History   Marital status: Married    Spouse name: Not on file   Number of children: Not on file   Years of education: Not on file   Highest education level: Not on file  Occupational History   Not on file  Tobacco Use   Smoking status: Former    Pack years: 0.00   Smokeless tobacco: Never  Vaping Use   Vaping Use: Never used  Substance and Sexual Activity   Alcohol use: Yes    Comment: 1/month   Drug use: No   Sexual activity: Yes    Birth control/protection: None  Other Topics Concern   Not on file  Social History Narrative   Not on file   Social Determinants of Health   Financial Resource Strain: Not on file  Food Insecurity: Not on file  Transportation Needs: Not on file  Physical Activity: Not on file  Stress: Not on file  Social Connections: Not on file   Intimate Partner Violence: Not on file   No family history on file.  OBJECTIVE:  Vitals:   09/23/20 1919  BP: 128/83  Pulse: 68  Resp: 18  Temp: 97.7 F (36.5 C)  TempSrc: Tympanic  SpO2: 98%    General appearance: alert; no distress Eyes: PERRLA; EOMI HENT: normocephalic; atraumatic Neck: supple with FROM Lungs: clear to auscultation bilaterally Heart: regular rate and rhythm.   Extremities: no edema; symmetrical with no gross deformities Skin: warm and dry Neurologic: CN 2-12 grossly intact; finger to nose without difficulty; normal gait; strength and sensation intact bilaterally about the upper and lower extremities; negative pronator drift Psychological: alert and cooperative; normal mood and affect  ASSESSMENT & PLAN:  1. Migraine without aura and without status migrainosus, not intractable     Meds ordered this encounter  Medications   ketorolac (TORADOL) 30 MG/ML injection 30 mg   dexamethasone (DECADRON) injection 10 mg   Unable to rule out stroke in urgent care setting.  Offered patient further evaluation and management in the ED.  Patient declines at this time and would like to try outpatient therapy first.  Aware of the risk associated with this decision including missed diagnosis, organ damage, organ failure, and/or death.  Patient aware and in agreement.     Migraine cocktail given in office Rest and drink plenty of fluids Use OTC medications as needed for symptomatic relief Follow up with PCP if symptoms persists Return or go to the ER if you have any new or worsening symptoms such as fever, chills, nausea, vomiting, chest pain, shortness of breath, cough, vision changes, worsening headache despite treatment, slurred speech, facial asymmetry, weakness in arms or legs, etc...  Reviewed expectations re: course of current medical issues. Questions answered. Outlined signs and symptoms indicating need for more acute intervention. Patient verbalized  understanding. After Visit Summary given.    Lestine Box, PA-C 09/23/20 1944

## 2020-09-23 NOTE — ED Triage Notes (Signed)
Headache x 4 days.  Seen at [facility] ED on 09/22/20 for same and given migraine cocktail which worked.  Headache came back yesterday morning.

## 2020-10-06 ENCOUNTER — Ambulatory Visit: Payer: 59 | Admitting: Family Medicine

## 2020-10-06 ENCOUNTER — Encounter: Payer: Self-pay | Admitting: Family Medicine

## 2020-10-06 ENCOUNTER — Other Ambulatory Visit: Payer: Self-pay

## 2020-10-06 VITALS — BP 101/70 | HR 68 | Temp 98.4°F

## 2020-10-06 DIAGNOSIS — G35 Multiple sclerosis: Secondary | ICD-10-CM

## 2020-10-06 DIAGNOSIS — E049 Nontoxic goiter, unspecified: Secondary | ICD-10-CM

## 2020-10-06 NOTE — Progress Notes (Signed)
   Subjective:    Patient ID: Lisa [name], female    DOB: 1980/04/26, 40 y.o.   MRN: [id number]  HPI  Patient arrives for a follow up on recent hospitalization for MS flare Patient also having some congestion and drainage issues Patient also needs a ultrasound of her thyroid. Recently in the hospital We discussed the findings from the MRI including the thyroid nodule enlargement. Review of Systems     Objective:   Physical Exam Notable weakness on the right side.  Lungs clear heart regular otherwise examination good  No tenderness of the thyroid no nodules appreciated     Assessment & Plan:  1. Enlarged thyroid Recommend lab work as well as ultrasound.  Await the results.  May need referral to endocrinology - US THYROID - TSH - T4, free - T3 - Thyroid Peroxidase Antibodies (TPO) ([facility])  Morbid obesity portion control MS recently in the hospital with flareup has notable weakness on the right side disabled because of this

## 2020-10-07 LAB — THYROID PEROXIDASE ANTIBODY: Thyroperoxidase Ab SerPl-aCnc: 177 IU/mL — ABNORMAL HIGH (ref 0–34)

## 2020-10-07 LAB — T3: T3, Total: 104 ng/dL (ref 71–180)

## 2020-10-07 LAB — T4, FREE: Free T4: 1.28 ng/dL (ref 0.82–1.77)

## 2020-10-07 LAB — TSH: TSH: 1.43 u[IU]/mL (ref 0.450–4.500)

## 2020-10-08 ENCOUNTER — Other Ambulatory Visit: Payer: Self-pay

## 2020-10-08 ENCOUNTER — Ambulatory Visit (INDEPENDENT_AMBULATORY_CARE_PROVIDER_SITE_OTHER): Payer: 59

## 2020-10-08 ENCOUNTER — Ambulatory Visit
Admission: RE | Admit: 2020-10-08 | Discharge: 2020-10-08 | Disposition: A | Discharge status: Home / Self Care | Payer: 59 | Source: Ambulatory Visit | Attending: Family Medicine | Admitting: Family Medicine

## 2020-10-08 VITALS — BP 105/72 | HR 60 | Temp 99.0°F | Resp 20

## 2020-10-08 DIAGNOSIS — B9789 Other viral agents as the cause of diseases classified elsewhere: Secondary | ICD-10-CM | POA: Insufficient documentation

## 2020-10-08 DIAGNOSIS — R059 Cough, unspecified: Secondary | ICD-10-CM | POA: Diagnosis not present

## 2020-10-08 DIAGNOSIS — J069 Acute upper respiratory infection, unspecified: Secondary | ICD-10-CM | POA: Diagnosis not present

## 2020-10-08 DIAGNOSIS — J028 Acute pharyngitis due to other specified organisms: Secondary | ICD-10-CM | POA: Diagnosis present

## 2020-10-08 LAB — POCT RAPID STREP A (OFFICE): Rapid Strep A Screen: NEGATIVE

## 2020-10-08 NOTE — ED Triage Notes (Signed)
Pt presents with cough sore throat and hoarseness for several weeks

## 2020-10-08 NOTE — Discharge Instructions (Signed)
Your rapid strep test is negative.  A throat culture is pending; we will call you if it is positive requiring treatment.    Chest xray negative for pneumonia today  Continue supportive treatment at home  Your COVID and Influenza tests are pending.  You should self quarantine until the test results are back.    Take Tylenol or ibuprofen as needed for fever or discomfort.  Rest and keep yourself hydrated.    Follow-up with your primary care provider if your symptoms are not improving.

## 2020-10-08 NOTE — ED Provider Notes (Signed)
[place]   [id number] 10/08/20 Arrival Time: 3235   CC: COVID symptoms  SUBJECTIVE: History from: patient.  Lisa [name] is a 40 y.o. female who presents with sore throat, cough, fatigue for the last  3-4 days. Denies sick exposure to COVID, flu or strep. Denies recent travel. Has positive history of Covid. Has not completed Covid vaccines. Has not completed flu vaccine. Has taken mucinex DM and ibuprofen. Has had recent pneumonia about 2 months ago, hx MS and is on immunosuppressant. There are no aggravating or alleviating factors. Denies previous symptoms in the past. Denies fever, sinus pain, rhinorrhea, SOB, wheezing, chest pain, nausea, changes in bowel or bladder habits.    ROS: As per HPI.  All other pertinent ROS negative.     Past Medical History:  Diagnosis Date   Anxiety    Headache(784.0)    Hypertension    Neuromuscular disorder ([place])    sus   PONV (postoperative nausea and vomiting)    Thyroid nodule    Past Surgical History:  Procedure Laterality Date   ADENOIDECTOMY     CESAREAN SECTION     CESAREAN SECTION     THYROIDECTOMY  06/18/2011   Procedure: THYROIDECTOMY;  Surgeon: Melida Quitter, MD;  Location: [facility] OR;  Service: ENT;  Laterality: Right;   TONSILLECTOMY     TYMPANOPLASTY     Allergies  Allergen Reactions   Sulfa Antibiotics Anaphylaxis   Darvocet [Propoxyphene N-Acetaminophen] Other (See Comments)    Reaction:  Hallucinations    Cephalexin Rash   Imitrex [Sumatriptan Base] Other (See Comments)    Reaction:  Makes pts jaw lock-up   Penicillins Rash and Other (See Comments)    Has patient had a PCN reaction causing immediate rash, facial/tongue/throat swelling, SOB or lightheadedness with hypotension: No Has patient had a PCN reaction causing severe rash involving mucus membranes or skin necrosis: No Has patient had a PCN reaction that required hospitalization: No Has patient had a PCN reaction occurring within the last 10 years:  No If all of the above answers are "NO", then may proceed with Cephalosporin use.   No current facility-administered medications on file prior to encounter.   Current Outpatient Medications on File Prior to Encounter  Medication Sig Dispense Refill   amantadine (SYMMETREL) 100 MG capsule Take 100 mg by mouth 2 (two) times daily.     citalopram (CELEXA) 40 MG tablet TAKE 1 TABLET BY MOUTH  DAILY 90 tablet 1   levothyroxine (SYNTHROID) 100 MCG tablet TAKE 1 TABLET BY MOUTH  DAILY BEFORE BREAKFAST 90 tablet 1   ondansetron (ZOFRAN) 8 MG tablet Take 1 tablet (8 mg total) by mouth every 8 (eight) hours as needed for nausea. 20 tablet 2   oxybutynin (DITROPAN-XL) 5 MG 24 hr tablet Take 1 tablet by mouth daily.     PRESCRIPTION MEDICATION ocrevus infusions twice yearly     propranolol (INDERAL) 40 MG tablet TAKE 1 TABLET BY MOUTH  TWICE DAILY 180 tablet 1   Social History   Socioeconomic History   Marital status: Married    Spouse name: Not on file   Number of children: Not on file   Years of education: Not on file   Highest education level: Not on file  Occupational History   Not on file  Tobacco Use   Smoking status: Former    Pack years: 0.00   Smokeless tobacco: Never  Vaping Use   Vaping Use: Never used  Substance and Sexual Activity  Alcohol use: Yes    Comment: 1/month   Drug use: No   Sexual activity: Yes    Birth control/protection: None  Other Topics Concern   Not on file  Social History Narrative   Not on file   Social Determinants of Health   Financial Resource Strain: Not on file  Food Insecurity: Not on file  Transportation Needs: Not on file  Physical Activity: Not on file  Stress: Not on file  Social Connections: Not on file  Intimate Partner Violence: Not on file   History reviewed. No pertinent family history.  OBJECTIVE:  Vitals:   10/08/20 1237  BP: 105/72  Pulse: 60  Resp: 20  Temp: 99 F (37.2 C)  SpO2: 97%     General appearance:  alert; appears fatigued, but nontoxic; speaking in full sentences and tolerating own secretions HEENT: NCAT; Ears: EACs clear, TMs pearly gray; Eyes: PERRL.  EOM grossly intact. Sinuses: nontender; Nose: nares patent with clear rhinorrhea, Throat: oropharynx erythematous, cobblestoning present, tonsils non erythematous or enlarged, uvula midline  Neck: supple without LAD Lungs: unlabored respirations, symmetrical air entry; cough: mild; no respiratory distress; diminished lung sounds throughout Heart: regular rate and rhythm.  Radial pulses 2+ symmetrical bilaterally Skin: warm and dry Psychological: alert and cooperative; normal mood and affect  LABS:  Results for orders placed or performed during the hospital encounter of 10/08/20 (from the past 24 hour(s))  POCT rapid strep A     Status: None   Collection Time: 10/08/20 12:40 PM  Result Value Ref Range   Rapid Strep A Screen Negative Negative     ASSESSMENT & PLAN:  1. Viral URI with cough   2. Sore throat (viral)    Your rapid strep test is negative.  A throat culture is pending; we will call you if it is positive requiring treatment.   Chest xray negative for pneumonia today Continue supportive care at home COVID and flu testing ordered.  It will take between 2-3 days for test results. Someone will contact you regarding abnormal results.   Work note provided Patient should remain in quarantine until they have received Covid results.  If negative you may resume normal activities (go back to work/school) while practicing hand hygiene, social distance, and mask wearing.  If positive, patient should remain in quarantine for at least 5 days from symptom onset AND greater than 72 hours after symptoms resolution (absence of fever without the use of fever-reducing medication and improvement in respiratory symptoms), whichever is longer Get plenty of rest and push fluids Use OTC zyrtec for nasal congestion, runny nose, and/or sore  throat Use OTC flonase for nasal congestion and runny nose Use medications daily for symptom relief Use OTC medications like ibuprofen or tylenol as needed fever or pain Call or go to the ED if you have any new or worsening symptoms such as fever, worsening cough, shortness of breath, chest tightness, chest pain, turning blue, changes in mental status.  Reviewed expectations re: course of current medical issues. Questions answered. Outlined signs and symptoms indicating need for more acute intervention. Patient verbalized understanding. After Visit Summary given.          Faustino Congress, NP 10/08/20 1341

## 2020-10-09 LAB — COVID-19, FLU A+B NAA
Influenza A, NAA: NOT DETECTED
Influenza B, NAA: NOT DETECTED
SARS-CoV-2, NAA: NOT DETECTED

## 2020-10-11 ENCOUNTER — Encounter (HOSPITAL_COMMUNITY): Payer: Self-pay | Admitting: Emergency Medicine

## 2020-10-11 ENCOUNTER — Emergency Department (HOSPITAL_COMMUNITY)
Admission: EM | Admit: 2020-10-11 | Discharge: 2020-10-11 | Disposition: A | Discharge status: Home / Self Care | Payer: 59 | Attending: Emergency Medicine | Admitting: Emergency Medicine

## 2020-10-11 ENCOUNTER — Other Ambulatory Visit: Payer: Self-pay

## 2020-10-11 ENCOUNTER — Emergency Department (HOSPITAL_COMMUNITY): Payer: 59

## 2020-10-11 DIAGNOSIS — Z79899 Other long term (current) drug therapy: Secondary | ICD-10-CM | POA: Insufficient documentation

## 2020-10-11 DIAGNOSIS — I10 Essential (primary) hypertension: Secondary | ICD-10-CM | POA: Insufficient documentation

## 2020-10-11 DIAGNOSIS — Y9389 Activity, other specified: Secondary | ICD-10-CM | POA: Insufficient documentation

## 2020-10-11 DIAGNOSIS — Z23 Encounter for immunization: Secondary | ICD-10-CM | POA: Insufficient documentation

## 2020-10-11 DIAGNOSIS — S51811A Laceration without foreign body of right forearm, initial encounter: Secondary | ICD-10-CM

## 2020-10-11 DIAGNOSIS — Z87891 Personal history of nicotine dependence: Secondary | ICD-10-CM | POA: Insufficient documentation

## 2020-10-11 DIAGNOSIS — Y92007 Garden or yard of unspecified non-institutional (private) residence as the place of occurrence of the external cause: Secondary | ICD-10-CM | POA: Diagnosis not present

## 2020-10-11 DIAGNOSIS — E039 Hypothyroidism, unspecified: Secondary | ICD-10-CM | POA: Diagnosis not present

## 2020-10-11 DIAGNOSIS — W01198A Fall on same level from slipping, tripping and stumbling with subsequent striking against other object, initial encounter: Secondary | ICD-10-CM | POA: Diagnosis not present

## 2020-10-11 DIAGNOSIS — S59911A Unspecified injury of right forearm, initial encounter: Secondary | ICD-10-CM | POA: Diagnosis present

## 2020-10-11 LAB — CULTURE, GROUP A STREP (THRC)

## 2020-10-11 MED ORDER — TETANUS-DIPHTH-ACELL PERTUSSIS 5-2.5-18.5 LF-MCG/0.5 IM SUSY
0.5000 mL | PREFILLED_SYRINGE | Freq: Once | INTRAMUSCULAR | Status: AC
  Administered 2020-10-11: 0.5 mL via INTRAMUSCULAR
  Filled 2020-10-11: qty 0.5

## 2020-10-11 MED ORDER — IBUPROFEN 400 MG PO TABS
600.0000 mg | ORAL_TABLET | Freq: Once | ORAL | Status: AC
  Administered 2020-10-11: 600 mg via ORAL
  Filled 2020-10-11: qty 1

## 2020-10-11 MED ORDER — LIDOCAINE-EPINEPHRINE (PF) 2 %-1:200000 IJ SOLN
20.0000 mL | Freq: Once | INTRAMUSCULAR | Status: AC
  Administered 2020-10-11: 20 mL via INTRADERMAL
  Filled 2020-10-11: qty 20

## 2020-10-11 NOTE — ED Triage Notes (Addendum)
Pt fell while working outside.  Reports MS flare up.  Approx 2-3 cm laceration to R forearm.  Bleeding controlled.  Bandage placed at triage.
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
## 2020-10-11 NOTE — Discharge Instructions (Addendum)
1. Medications: Tylenol or ibuprofen for pain, usual home medications  2. Treatment: ice for swelling, keep wound clean with warm soap and water and keep bandage dry, do not submerge in water for 24 hours  3. Follow Up: Please return in 10-14 days to have your stitches removed or sooner if you have concerns. Stitches can also be removed by primary care doctor or urgent careReturn to the emergency department for increased redness, drainage of pus from the wound   WOUND CARE  Keep area clean and dry for 24 hours. Do not remove bandage, if applied.  After 24 hours, remove bandage and wash wound gently with mild soap and warm water. Reapply a new bandage after cleaning wound, if directed.   Continue daily cleansing with soap and water until stitches/staples are removed.  Do not apply any ointments or creams to the wound while stitches/staples are in place, as this may cause delayed healing. Return if you experience any of the following signs of infection: Swelling, redness, pus drainage, streaking, fever >101.0 F  Return if you experience excessive bleeding that does not stop after 15-20 minutes of constant, firm pressure.
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
## 2020-10-20 ENCOUNTER — Other Ambulatory Visit: Payer: Self-pay

## 2020-10-20 ENCOUNTER — Ambulatory Visit (HOSPITAL_COMMUNITY)
Admission: RE | Admit: 2020-10-20 | Discharge: 2020-10-20 | Disposition: A | Discharge status: Home / Self Care | Payer: 59 | Source: Ambulatory Visit | Attending: Family Medicine | Admitting: Family Medicine

## 2020-10-20 DIAGNOSIS — E049 Nontoxic goiter, unspecified: Secondary | ICD-10-CM | POA: Insufficient documentation

## 2020-10-31 ENCOUNTER — Other Ambulatory Visit: Payer: Self-pay | Admitting: Family Medicine

## 2020-12-20 DIAGNOSIS — Z029 Encounter for administrative examinations, unspecified: Secondary | ICD-10-CM

## 2020-12-23 ENCOUNTER — Emergency Department (HOSPITAL_COMMUNITY)
Admission: EM | Admit: 2020-12-23 | Discharge: 2020-12-23 | Discharge status: Against Medical Advice | Payer: 59 | Attending: Emergency Medicine | Admitting: Emergency Medicine

## 2020-12-23 DIAGNOSIS — R109 Unspecified abdominal pain: Secondary | ICD-10-CM | POA: Insufficient documentation

## 2020-12-23 DIAGNOSIS — Z5321 Procedure and treatment not carried out due to patient leaving prior to being seen by health care provider: Secondary | ICD-10-CM | POA: Diagnosis not present

## 2020-12-23 NOTE — ED Triage Notes (Signed)
Pt c/o intermittent gallbladder pain x38mo hx same so recognized symptoms. Tried to get in w PCP for sick visit, no appts available. Went to [facility] sent to ED for further eval/US. Endorses N/V, Diarrhea x15moPO intake "runs straight through me."  Hx MS  6/10, sharp pain, "just off center" to R side, unwell

## 2020-12-23 NOTE — ED Notes (Signed)
Pt was advised to stay but still decided to leave

## 2021-01-23 ENCOUNTER — Other Ambulatory Visit: Payer: Self-pay | Admitting: Family Medicine

## 2021-01-24 ENCOUNTER — Other Ambulatory Visit: Payer: Self-pay | Admitting: Family Medicine

## 2021-04-28 ENCOUNTER — Ambulatory Visit: Payer: 59 | Admitting: Family Medicine

## 2021-05-02 ENCOUNTER — Encounter: Payer: Self-pay | Admitting: Emergency Medicine

## 2021-05-02 ENCOUNTER — Other Ambulatory Visit: Payer: Self-pay

## 2021-05-02 ENCOUNTER — Ambulatory Visit (INDEPENDENT_AMBULATORY_CARE_PROVIDER_SITE_OTHER): Payer: 59

## 2021-05-02 ENCOUNTER — Ambulatory Visit
Admission: EM | Admit: 2021-05-02 | Discharge: 2021-05-02 | Disposition: A | Discharge status: Home / Self Care | Payer: 59 | Attending: Student | Admitting: Student

## 2021-05-02 DIAGNOSIS — R0602 Shortness of breath: Secondary | ICD-10-CM

## 2021-05-02 DIAGNOSIS — J069 Acute upper respiratory infection, unspecified: Secondary | ICD-10-CM | POA: Diagnosis not present

## 2021-05-02 DIAGNOSIS — G35 Multiple sclerosis: Secondary | ICD-10-CM

## 2021-05-02 DIAGNOSIS — Z1152 Encounter for screening for COVID-19: Secondary | ICD-10-CM | POA: Diagnosis not present

## 2021-05-02 MED ORDER — PREDNISONE 20 MG PO TABS: 40.0000 mg | ORAL_TABLET | Freq: Every day | ORAL | 0 refills | Status: AC

## 2021-05-02 MED ORDER — ALBUTEROL SULFATE HFA 108 (90 BASE) MCG/ACT IN AERS: 1.0000 | INHALATION_SPRAY | Freq: Four times a day (QID) | RESPIRATORY_TRACT | 0 refills | Status: DC | PRN

## 2021-05-02 NOTE — ED Provider Notes (Signed)
[facility]    CSN: [id number] Arrival date & time: 05/02/21  1410      History   Chief Complaint No chief complaint on file.   HPI Lisa [name] is a 41 y.o. female presenting with viral syndrome for 5 days.  Medical history multiple sclerosis, pneumonia, shortness of breath, obesity.  Describes fevers, cough - productive of thick brown, vomiting, indigestion. Has coughed so hard that she vomited twice. Nausea. No diarrhea, no abd pain. Denies SOB. Generalized malaise. Temperature running 101 per home thermometer. Ibuprofen providing some relief. Has attempted benedryl without relief. Denies CP, dizziness, weakness.   HPI  Past Medical History:  Diagnosis Date   Anxiety    Headache(784.0)    Hypertension    Neuromuscular disorder ([place])    sus   PONV (postoperative nausea and vomiting)    Thyroid nodule     Patient Active Problem List   Diagnosis Date Noted   SOB (shortness of breath) 07/05/2020   Pneumonia due to infectious organism 07/05/2020   History of pneumonia 07/05/2020   Generalized anxiety disorder 03/28/2020   Multiple sclerosis ([place]) 12/07/2018   History of numbness 04/29/2018   Numbness in left leg 04/29/2018   Gastroesophageal reflux disease without esophagitis 07/02/2015   Hypothyroidism 10/24/2014   Morbid obesity ([place]) 10/21/2014   Thyroid activity decreased 07/16/2014   Migraine without aura and without status migrainosus, not intractable 07/16/2014   Anxiety 10/01/2012   Thyroid nodule 06/18/2011    Past Surgical History:  Procedure Laterality Date   ADENOIDECTOMY     [place]  06/18/2011   Procedure: THYROIDECTOMY;  Surgeon: Melida Quitter, MD;  Location: [facility];  Service: ENT;  Laterality: Right;   TONSILLECTOMY     TYMPANOPLASTY      OB History     Gravida  2   Para  2   Term  2   Preterm      AB      Living  2      SAB      IAB      Ectopic       Multiple      Live Births  2            Home Medications    Prior to Admission medications   Medication Sig Start Date End Date Taking? Authorizing Provider  albuterol (VENTOLIN HFA) 108 (90 Base) MCG/ACT inhaler Inhale 1-2 puffs into the lungs every 6 (six) hours as needed for wheezing or shortness of breath. 05/02/21  Yes Hazel Sams, PA-C  predniSONE (DELTASONE) 20 MG tablet Take 2 tablets (40 mg total) by mouth daily for 5 days. Take with breakfast or lunch. Avoid NSAIDs (ibuprofen, etc) while taking this medication. 05/02/21 05/07/21 Yes Hazel Sams, PA-C  amantadine (SYMMETREL) 100 MG capsule Take 100 mg by mouth 2 (two) times daily. 09/09/20   [provider]  citalopram (CELEXA) 40 MG tablet TAKE 1 TABLET BY MOUTH  DAILY 01/24/21   Kathyrn Drown, MD  levothyroxine (SYNTHROID) 100 MCG tablet TAKE 1 TABLET BY MOUTH  DAILY BEFORE BREAKFAST 01/24/21   Luking, Elayne Snare, MD  ondansetron (ZOFRAN) 8 MG tablet Take 1 tablet (8 mg total) by mouth every 8 (eight) hours as needed for nausea. 12/29/19   Kathyrn Drown, MD  oxybutynin (DITROPAN-XL) 5 MG 24 hr tablet Take 1 tablet by mouth daily. 10/03/20   [provider]  PRESCRIPTION MEDICATION ocrevus infusions twice yearly    [provider]  propranolol (INDERAL) 40 MG tablet TAKE 1 TABLET BY MOUTH  TWICE DAILY 01/25/21   Kathyrn Drown, MD    Family History History reviewed. No pertinent family history.  Social History Social History   Tobacco Use   Smoking status: Former   Smokeless tobacco: Never  Scientific laboratory technician Use: Never used  Substance Use Topics   Alcohol use: Yes    Comment: 1/month   Drug use: No     Allergies   Sulfa antibiotics, Darvocet [propoxyphene n-acetaminophen], Cephalexin, Imitrex [sumatriptan base], and Penicillins   Review of Systems Review of Systems  Constitutional:  Positive for fever. Negative for appetite change and chills.  HENT:  Positive for congestion.  Negative for ear pain, rhinorrhea, sinus pressure, sinus pain and sore throat.   Eyes:  Negative for redness and visual disturbance.  Respiratory:  Positive for cough. Negative for chest tightness, shortness of breath and wheezing.   Cardiovascular:  Negative for chest pain and palpitations.  Gastrointestinal:  Positive for vomiting. Negative for abdominal pain, constipation, diarrhea and nausea.  Genitourinary:  Negative for dysuria, frequency and urgency.  Musculoskeletal:  Negative for myalgias.  Neurological:  Negative for dizziness, weakness and headaches.  Psychiatric/Behavioral:  Negative for confusion.   All other systems reviewed and are negative.   Physical Exam Triage Vital Signs ED Triage Vitals  Enc Vitals Group     BP 05/02/21 1422 119/81     Pulse Rate 05/02/21 1422 68     Resp 05/02/21 1422 18     Temp 05/02/21 1422 98.2 F (36.8 C)     Temp Source 05/02/21 1422 Oral     SpO2 05/02/21 1422 96 %     Weight --      Height --      Head Circumference --      Peak Flow --      Pain Score 05/02/21 1423 4     Pain Loc --      Pain Edu? --      Excl. in [place]? --    No data found.  Updated Vital Signs BP 119/81 (BP Location: Right Arm)    Pulse 68    Temp 98.2 F (36.8 C) (Oral)    Resp 18    LMP 04/18/2021 (Approximate)    SpO2 96%   Visual Acuity Right Eye Distance:   Left Eye Distance:   Bilateral Distance:    Right Eye Near:   Left Eye Near:    Bilateral Near:     Physical Exam Vitals reviewed.  Constitutional:      General: She is not in acute distress.    Appearance: Normal appearance. She is ill-appearing.  HENT:     Head: Normocephalic and atraumatic.     Right Ear: Tympanic membrane, ear canal and external ear normal. No tenderness. No middle ear effusion. There is no impacted cerumen. Tympanic membrane is not perforated, erythematous, retracted or bulging.     Left Ear: Tympanic membrane, ear canal and external ear normal. No tenderness.  No  middle ear effusion. There is no impacted cerumen. Tympanic membrane is not perforated, erythematous, retracted or bulging.     Nose: Nose normal. No congestion.     Mouth/Throat:     Mouth: Mucous membranes are moist.     Pharynx: Uvula midline. No oropharyngeal exudate or posterior oropharyngeal erythema.  Eyes:  Extraocular Movements: Extraocular movements intact.     Pupils: Pupils are equal, round, and reactive to light.  Cardiovascular:     Rate and Rhythm: Normal rate and regular rhythm.     Heart sounds: Normal heart sounds.  Pulmonary:     Effort: Pulmonary effort is normal.     Breath sounds: Decreased breath sounds and wheezing present. No rhonchi or rales.     Comments: Few expiratory wheezes and decreased breath sounds.  Abdominal:     Palpations: Abdomen is soft.     Tenderness: There is no abdominal tenderness. There is no guarding or rebound.  Lymphadenopathy:     Cervical: No cervical adenopathy.     Right cervical: No superficial cervical adenopathy.    Left cervical: No superficial cervical adenopathy.  Neurological:     General: No focal deficit present.     Mental Status: She is alert and oriented to person, place, and time.  Psychiatric:        Mood and Affect: Mood normal.        Behavior: Behavior normal.        Thought Content: Thought content normal.        Judgment: Judgment normal.     UC Treatments / Results  Labs (all labs ordered are listed, but only abnormal results are displayed) Labs Reviewed  COVID-19, FLU A+B NAA    EKG   Radiology DG Chest 2 View  Result Date: 05/02/2021 CLINICAL DATA:  Shortness of breath. EXAM: CHEST - 2 VIEW COMPARISON:  10/08/2020 FINDINGS: The heart size and mediastinal contours are within normal limits. Both lungs are clear. The visualized skeletal structures are unremarkable. IMPRESSION: No active cardiopulmonary disease. Electronically Signed   By: Nolon Nations M.D.   On: 05/02/2021 14:52     Procedures Procedures (including critical care time)  Medications Ordered in UC Medications - No data to display  Initial Impression / Assessment and Plan / UC Course  I have reviewed the triage vital signs and the nursing notes.  Pertinent labs & imaging results that were available during my care of the patient were reviewed by me and considered in my medical decision making (see chart for details).     This patient is a very pleasant 41 y.o. year old female presenting with viral URI with cough.. Afebrile, nontachy. LMP 04/18/2021, States she is not pregnant or breastfeeding. Multiple sclerosis- no new deficit.   CXR - No active cardiopulmonary disease. Covid and influenza PCR sent. Symptoms x5 days, she is out of antiviral window.  Prednisone and albuterol inhaler sent. Continue OTC medications.  ED return precautions discussed. Patient verbalizes understanding and agreement.     Final Clinical Impressions(s) / UC Diagnoses   Final diagnoses:  Encounter for screening for COVID-19  Multiple sclerosis ([place])  Viral URI with cough     Discharge Instructions      -Your chest xray looks good. There is no pneumonia.  -Promethazine DM cough syrup for congestion/cough. This could make you drowsy, so take at night before bed. -Albuterol inhaler as needed for cough, wheezing, shortness of breath, 1 to 2 puffs every 6 hours as needed. -With a virus, you're typically contagious for 5-7 days, or as long as you're having fevers.  -Follow-up if symptoms getting worse instead of better, like shortness of breath, fever/chills, etc.     ED Prescriptions     Medication Sig Dispense Auth. Provider   predniSONE (DELTASONE) 20 MG tablet Take 2 tablets (40 mg  total) by mouth daily for 5 days. Take with breakfast or lunch. Avoid NSAIDs (ibuprofen, etc) while taking this medication. 10 tablet Hazel Sams, PA-C   albuterol (VENTOLIN HFA) 108 (90 Base) MCG/ACT inhaler Inhale 1-2 puffs  into the lungs every 6 (six) hours as needed for wheezing or shortness of breath. 1 each Hazel Sams, PA-C      PDMP not reviewed this encounter.   Hazel Sams, PA-C 05/02/21 [phone]

## 2021-05-02 NOTE — Discharge Instructions (Addendum)
-  Your chest xray looks good. There is no pneumonia.  -Promethazine DM cough syrup for congestion/cough. This could make you drowsy, so take at night before bed. -Albuterol inhaler as needed for cough, wheezing, shortness of breath, 1 to 2 puffs every 6 hours as needed. -With a virus, you're typically contagious for 5-7 days, or as long as you're having fevers.  -Follow-up if symptoms getting worse instead of better, like shortness of breath, fever/chills, etc.

## 2021-05-02 NOTE — ED Triage Notes (Signed)
Fever, nausea, head congestion x 5 days.  States she feel worse than when she had covid.  Feels a burning in her chest when she breathes

## 2021-05-03 LAB — COVID-19, FLU A+B NAA
Influenza A, NAA: NOT DETECTED
Influenza B, NAA: NOT DETECTED
SARS-CoV-2, NAA: NOT DETECTED

## 2021-05-08 ENCOUNTER — Ambulatory Visit: Payer: 59 | Admitting: Family Medicine

## 2021-05-08 ENCOUNTER — Other Ambulatory Visit: Payer: Self-pay

## 2021-05-08 VITALS — BP 105/72 | HR 66 | Temp 97.7°F | Ht 69.0 in | Wt 253.0 lb

## 2021-05-08 DIAGNOSIS — Z13220 Encounter for screening for lipoid disorders: Secondary | ICD-10-CM | POA: Diagnosis not present

## 2021-05-08 DIAGNOSIS — G43009 Migraine without aura, not intractable, without status migrainosus: Secondary | ICD-10-CM | POA: Diagnosis not present

## 2021-05-08 DIAGNOSIS — E890 Postprocedural hypothyroidism: Secondary | ICD-10-CM

## 2021-05-08 DIAGNOSIS — G35 Multiple sclerosis: Secondary | ICD-10-CM

## 2021-05-08 MED ORDER — LEVOTHYROXINE SODIUM 100 MCG PO TABS: 100.0000 ug | ORAL_TABLET | Freq: Every day | ORAL | 2 refills | Status: DC

## 2021-05-08 MED ORDER — CITALOPRAM HYDROBROMIDE 40 MG PO TABS: 40.0000 mg | ORAL_TABLET | Freq: Every day | ORAL | 2 refills | Status: DC

## 2021-05-08 MED ORDER — PROPRANOLOL HCL ER 80 MG PO CP24: 80.0000 mg | ORAL_CAPSULE | Freq: Every day | ORAL | 2 refills | Status: DC

## 2021-05-08 NOTE — Progress Notes (Signed)
° °  Subjective:    Patient ID: Lisa [name], female    DOB: 1981-01-12, 41 y.o.   MRN: [id number]  HPI Patient here for follow up on medications.  She has MS She does need refills on her medicines Postoperative hypothyroidism - Plan: TSH + free T4  Multiple sclerosis ([facility])  Migraine without aura and without status migrainosus, not intractable  Screening for lipid disorders - Plan: Lipid Profile She will get refills of her MS medicine from her MS doctor Migraines under better control with the longer acting propranolol Does take her thyroid medicine   Review of Systems     Objective:   Physical Exam  General-in no acute distress Eyes-no discharge Lungs-respiratory rate normal, CTA CV-no murmurs,RRR Extremities skin warm dry no edema Neuro grossly normal Behavior normal, alert       Assessment & Plan:  1. Postoperative hypothyroidism Check lab work continue medication refills given - TSH + free T4  2. Multiple sclerosis ([place]) She is to get refills from her MS doctor  3. Migraine without aura and without status migrainosus, not intractable Propranolol switch To long-acting  4. Screening for lipid disorders Screening lipid - Lipid Profile  Follow-up in 1 years time follow-up sooner problems

## 2021-09-18 ENCOUNTER — Telehealth: Payer: Self-pay | Admitting: *Deleted

## 2021-09-18 DIAGNOSIS — E611 Iron deficiency: Secondary | ICD-10-CM

## 2021-09-18 NOTE — Telephone Encounter (Signed)
Patient states her specialist at [facility] told her her Iron was low and she needs referral for iron infusion. Patient would like referral to hematology for iron infusion and is aware that the infusion center in now in [place]

## 2021-09-18 NOTE — Telephone Encounter (Signed)
Please initiate

## 2021-10-01 NOTE — Progress Notes (Deleted)
NO SHOW

## 2021-10-02 ENCOUNTER — Inpatient Hospital Stay (HOSPITAL_COMMUNITY): Payer: 59 | Attending: Hematology | Admitting: Hematology

## 2021-11-06 ENCOUNTER — Ambulatory Visit: Payer: 59 | Admitting: Family Medicine

## 2021-12-07 ENCOUNTER — Other Ambulatory Visit: Payer: Self-pay | Admitting: Obstetrics and Gynecology

## 2021-12-07 DIAGNOSIS — N644 Mastodynia: Secondary | ICD-10-CM

## 2021-12-12 ENCOUNTER — Ambulatory Visit
Admission: RE | Admit: 2021-12-12 | Discharge: 2021-12-12 | Disposition: A | Discharge status: Home / Self Care | Payer: 59 | Source: Ambulatory Visit | Attending: Obstetrics and Gynecology | Admitting: Obstetrics and Gynecology

## 2021-12-12 DIAGNOSIS — N644 Mastodynia: Secondary | ICD-10-CM

## 2021-12-20 ENCOUNTER — Other Ambulatory Visit: Payer: 59

## 2022-05-15 ENCOUNTER — Other Ambulatory Visit: Payer: Self-pay

## 2022-05-15 MED ORDER — PROPRANOLOL HCL ER 80 MG PO CP24: 80.0000 mg | ORAL_CAPSULE | Freq: Every day | ORAL | 0 refills | Status: DC

## 2022-06-18 ENCOUNTER — Ambulatory Visit (HOSPITAL_COMMUNITY)
Admission: RE | Admit: 2022-06-18 | Discharge: 2022-06-18 | Disposition: A | Discharge status: Home / Self Care | Payer: 59 | Source: Ambulatory Visit | Attending: Family Medicine | Admitting: Family Medicine

## 2022-06-18 ENCOUNTER — Ambulatory Visit: Payer: 59 | Admitting: Family Medicine

## 2022-06-18 VITALS — BP 105/74 | Ht 69.0 in | Wt 209.6 lb

## 2022-06-18 DIAGNOSIS — E890 Postprocedural hypothyroidism: Secondary | ICD-10-CM | POA: Diagnosis not present

## 2022-06-18 DIAGNOSIS — G35 Multiple sclerosis: Secondary | ICD-10-CM | POA: Diagnosis not present

## 2022-06-18 DIAGNOSIS — R61 Generalized hyperhidrosis: Secondary | ICD-10-CM

## 2022-06-18 DIAGNOSIS — E611 Iron deficiency: Secondary | ICD-10-CM | POA: Diagnosis not present

## 2022-06-18 MED ORDER — CITALOPRAM HYDROBROMIDE 40 MG PO TABS: 40.0000 mg | ORAL_TABLET | Freq: Every day | ORAL | 2 refills | Status: DC

## 2022-06-18 MED ORDER — LEVOTHYROXINE SODIUM 100 MCG PO TABS: 100.0000 ug | ORAL_TABLET | Freq: Every day | ORAL | 2 refills | Status: DC

## 2022-06-18 NOTE — Progress Notes (Signed)
   Subjective:    Patient ID: Lisa [name], female    DOB: [birth year], 42 y.o.   MRN: [id number]  HPI  Patient arrives for a follow up on hypothyroidism.  She takes her medicine regular basis States overall her moods are doing well Energy level fair Has MS which causes a lot of numbness weakness and fatigue Denies any recent illness or infection No fevers or chills Has been losing weight but that is mainly because she has been trying to eat healthy   Patient sates she is having problems with night sweats every night She states over the past 6 months having to get up several times per night At least once per night having severe sweats that cause her to have the sheets and clothing changed Review of Systems     Objective:   Physical Exam General-in no acute distress Eyes-no discharge Lungs-respiratory rate normal, CTA CV-no murmurs,RRR Extremities skin warm dry no edema Neuro grossly normal Behavior normal, alert        Assessment & Plan:  1. Postoperative hypothyroidism Check lab work.  Await thyroid test - TSH - T4, free  2. Multiple sclerosis ([place]) She is on treatments by specialist.  Currently the institution that she goes to is having a difficult time getting ongoing specialist to be available they are in the process of hiring more   3. Low iron Check lab work await the results  4. Night sweats Patient relates over the past 6 months intense night sweats.  She has to change her shirt and the sheets almost every single night she has been losing weight but she has been doing so because she has been trying to eat very healthy we will do some initial testing await results - DG Chest 2 View - CBC with Differential/Platelet - HIV Antibody (routine testing w rflx) - C-reactive protein - CMP14+EGFR - Iron, TIBC and Ferritin Panel - Urinalysis

## 2022-06-19 ENCOUNTER — Telehealth: Payer: Self-pay | Admitting: Family Medicine

## 2022-06-19 ENCOUNTER — Telehealth: Payer: Self-pay

## 2022-06-19 LAB — CBC WITH DIFFERENTIAL/PLATELET
Basophils Absolute: 0.1 10*3/uL (ref 0.0–0.2)
Basos: 1 %
EOS (ABSOLUTE): 0.1 10*3/uL (ref 0.0–0.4)
Eos: 2 %
Hematocrit: 36.3 % (ref 34.0–46.6)
Hemoglobin: 11.1 g/dL (ref 11.1–15.9)
Immature Grans (Abs): 0 10*3/uL (ref 0.0–0.1)
Immature Granulocytes: 0 %
Lymphocytes Absolute: 1.2 10*3/uL (ref 0.7–3.1)
Lymphs: 18 %
MCH: 24.3 pg — ABNORMAL LOW (ref 26.6–33.0)
MCHC: 30.6 g/dL — ABNORMAL LOW (ref 31.5–35.7)
MCV: 79 fL (ref 79–97)
Monocytes Absolute: 0.6 10*3/uL (ref 0.1–0.9)
Monocytes: 9 %
Neutrophils Absolute: 4.5 10*3/uL (ref 1.4–7.0)
Neutrophils: 70 %
Platelets: 330 10*3/uL (ref 150–450)
RBC: 4.57 x10E6/uL (ref 3.77–5.28)
RDW: 14.3 % (ref 11.7–15.4)
WBC: 6.4 10*3/uL (ref 3.4–10.8)

## 2022-06-19 LAB — TSH: TSH: 1.47 u[IU]/mL (ref 0.450–4.500)

## 2022-06-19 LAB — URINALYSIS
Bilirubin, UA: NEGATIVE
Glucose, UA: NEGATIVE
Leukocytes,UA: NEGATIVE
Nitrite, UA: NEGATIVE
RBC, UA: NEGATIVE
Specific Gravity, UA: 1.025 (ref 1.005–1.030)
Urobilinogen, Ur: 0.2 mg/dL (ref 0.2–1.0)
pH, UA: 6 (ref 5.0–7.5)

## 2022-06-19 LAB — CMP14+EGFR
ALT: 11 IU/L (ref 0–32)
AST: 14 IU/L (ref 0–40)
Albumin/Globulin Ratio: 1.9 (ref 1.2–2.2)
Albumin: 4.6 g/dL (ref 3.9–4.9)
Alkaline Phosphatase: 64 IU/L (ref 44–121)
BUN/Creatinine Ratio: 19 (ref 9–23)
BUN: 14 mg/dL (ref 6–20)
Bilirubin Total: 0.4 mg/dL (ref 0.0–1.2)
CO2: 22 mmol/L (ref 20–29)
Calcium: 9.4 mg/dL (ref 8.7–10.2)
Chloride: 101 mmol/L (ref 96–106)
Creatinine, Ser: 0.75 mg/dL (ref 0.57–1.00)
Globulin, Total: 2.4 g/dL (ref 1.5–4.5)
Glucose: 92 mg/dL (ref 70–99)
Potassium: 4.7 mmol/L (ref 3.5–5.2)
Sodium: 138 mmol/L (ref 134–144)
Total Protein: 7 g/dL (ref 6.0–8.5)
eGFR: 104 mL/min/{1.73_m2} (ref >59)

## 2022-06-19 LAB — C-REACTIVE PROTEIN: CRP: <1 mg/L (ref 0–10)

## 2022-06-19 LAB — IRON,TIBC AND FERRITIN PANEL
Ferritin: 8 ng/mL — ABNORMAL LOW (ref 15–150)
Iron Saturation: 7 % — CL (ref 15–55)
Iron: 33 ug/dL (ref 27–159)
Total Iron Binding Capacity: 470 ug/dL — ABNORMAL HIGH (ref 250–450)
UIBC: 437 ug/dL — ABNORMAL HIGH (ref 131–425)

## 2022-06-19 LAB — HIV ANTIBODY (ROUTINE TESTING W REFLEX): HIV Screen 4th Generation wRfx: NONREACTIVE

## 2022-06-19 LAB — T4, FREE: Free T4: 1.43 ng/dL (ref 0.82–1.77)

## 2022-06-19 MED ORDER — PROPRANOLOL HCL ER 80 MG PO CP24: 80.0000 mg | ORAL_CAPSULE | Freq: Every day | ORAL | 1 refills | Status: DC

## 2022-06-19 NOTE — Telephone Encounter (Signed)
Prescription sent electronically to pharmacy.   Telephone call no answer

## 2022-06-19 NOTE — Telephone Encounter (Signed)
Prescription Request  06/19/2022  LOV: 06/18/2022  What is the name of the medication or equipment? propranolol ER (INDERAL LA) 80 MG 24 hr capsule   Have you contacted your pharmacy to request a refill? Yes   Which pharmacy would you like this sent to?  [facility] [address], [place] [address] Phone: [phone]  Fax: [phone]   Patient notified that their request is being sent to the clinical staff for review and that they should receive a response within 2 business days.   Please advise at [facility] [phone]

## 2022-06-19 NOTE — Telephone Encounter (Signed)
Patient called to check on results from recent labs

## 2022-06-20 NOTE — Telephone Encounter (Signed)
I did discuss the results Iron deficient anemia Also heavy menstruations-she was encouraged to reach out to her gynecologist to get reevaluated perhaps pelvic ultrasound Nurses-please go ahead with consult with Dr. Delton Coombes regarding iron deficient anemia and night sweats Also please set out FIT test kit for patient to do a home stool test for blood that she will bring back to [place] she was told to come by Thursday Friday or Monday to pick this up  Please do the referral, please send patient a MyChart message letting her know the referral has been placed

## 2022-06-22 ENCOUNTER — Other Ambulatory Visit: Payer: Self-pay

## 2022-06-22 DIAGNOSIS — E611 Iron deficiency: Secondary | ICD-10-CM

## 2022-06-22 DIAGNOSIS — D508 Other iron deficiency anemias: Secondary | ICD-10-CM

## 2022-06-22 DIAGNOSIS — D509 Iron deficiency anemia, unspecified: Secondary | ICD-10-CM

## 2022-06-22 NOTE — Telephone Encounter (Signed)
Patient is aware per drs notes

## 2022-06-28 NOTE — Telephone Encounter (Signed)
Patient aware.

## 2022-08-02 NOTE — Progress Notes (Signed)
[facility] [address], [place] [address]   Clinic Day:  08/03/2022  Referring physician: Babs Sciara, MD  Patient Care Team: Babs Sciara, MD as PCP - General (Family Medicine) Doreatha Massed, MD as Medical Oncologist (Hematology)   ASSESSMENT & PLAN:   Assessment:  1.  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 - Labs on 06/18/2022: Ferritin 8, percent saturation 7, hemoglobin 11. - Positive for ice pica.  No prior history of transfusion or parenteral iron. - She cannot tolerate oral iron therapy. - She reports night sweats for the past 3 to 4 months, 3 times per week, drenching type.  2.  Social/family history: - Lives at home with her husband and children.  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  Non-smoker. - MGM: Breast cancer, PGM: Colon cancer, maternal first cousin died of TNBC at 54.  3.  Multiple sclerosis: - Usual symptoms: Numbness on the right side of the body, balance problems and vision issues. Ramiro Harvest every 6 months for the past 3 years.  Plan:  1.  Severe iron deficiency anemia from blood loss: - We reviewed labs from 06/18/2022. - We will see if night sweats improved with parenteral iron therapy. - As she cannot tolerate oral iron therapy, I have recommended parenteral iron therapy.  No sign of infection at this time. - We will give her Venofer 500 mg IV x 2 doses.  Discussed side effects including rare chance of anaphylactic reaction. - RTC 8 weeks with repeat CBC, ferritin and iron panel.   Orders Placed This Encounter  Procedures   CBC with Differential    Standing Status:   Future    Standing Expiration Date:   08/03/2023   Ferritin    Standing Status:   Future    Standing Expiration Date:   08/03/2023   Iron and TIBC ([facility] ONLY)    Standing Status:   Future    Standing Expiration Date:    08/03/2023      I,Katie Daubenspeck,acting as a scribe for Doreatha Massed, MD.,have documented all relevant documentation on the behalf of Doreatha Massed, MD,as directed by  Doreatha Massed, MD while in the presence of Doreatha Massed, MD.   I, Doreatha Massed MD, have reviewed the above documentation for accuracy and completeness, and I agree with the above.   Doreatha Massed, MD   4/19/20244:42 PM  CHIEF COMPLAINT/PURPOSE OF CONSULT:   Diagnosis: iron deficiency anemia  Current Therapy: Parenteral iron therapy  HISTORY OF PRESENT ILLNESS:   Lisa [name] is a 42 y.o. female presenting to clinic today for evaluation of iron deficiency anemia at the request of Dr. Gerda Diss.  Today, she states that she is doing well overall. Her appetite level is at 100%. Her energy level is at 50%.  She was found to have abnormal CBC from 11/2018, when her hemoglobin dropped below 12. An iron panel obtained in 09/2021 showed iron of 29 and ferritin of 4. More recently, she had repeat labs with her PCP showing-- iron 33, ferritin 8, hg 11.1. She does not take oral iron.  She has a history of MS, followed by neurology with [facility].  She reports ice pica.  PAST MEDICAL HISTORY:   Past Medical History: Past Medical History:  Diagnosis Date   Anxiety    Headache(784.0)    Hypertension    Neuromuscular disorder (HCC)  sus   PONV (postoperative nausea and vomiting)    Thyroid nodule     Surgical History: Past Surgical History:  Procedure Laterality Date   ADENOIDECTOMY     CESAREAN SECTION     CESAREAN SECTION     THYROIDECTOMY  06/18/2011   Procedure: THYROIDECTOMY;  Surgeon: Christia Reading, MD;  Location: [facility] OR;  Service: ENT;  Laterality: Right;   TONSILLECTOMY     TYMPANOPLASTY      Social History: Social History   Socioeconomic History   Marital status: Married    Spouse name: Not on file   Number of children: Not on file   Years of education: Not on file    Highest education level: Not on file  Occupational History   Not on file  Tobacco Use   Smoking status: Former   Smokeless tobacco: Never  Vaping Use   Vaping Use: Never used  Substance and Sexual Activity   Alcohol use: Yes    Comment: 1/month   Drug use: No   Sexual activity: Yes    Birth control/protection: None  Other Topics Concern   Not on file  Social History Narrative   Not on file   Social Determinants of Health   Financial Resource Strain: Not on file  Food Insecurity: No Food Insecurity (08/03/2022)   Hunger Vital Sign    Worried About Running Out of Food in the Last Year: Never true    Ran Out of Food in the Last Year: Never true  Transportation Needs: No Transportation Needs (08/03/2022)   PRAPARE - Administrator, Civil Service (Medical): No    Lack of Transportation (Non-Medical): No  Physical Activity: Not on file  Stress: Not on file  Social Connections: Not on file  Intimate Partner Violence: Not At Risk (08/03/2022)   Humiliation, Afraid, Rape, and Kick questionnaire    Fear of Current or Ex-Partner: No    Emotionally Abused: No    Physically Abused: No    Sexually Abused: No    Family History: Family History  Problem Relation Age of Onset   Breast cancer Maternal Grandmother 67   Breast cancer Cousin 40       maternal first cousin    Current Medications:  Current Outpatient Medications:    albuterol (VENTOLIN HFA) 108 (90 Base) MCG/ACT inhaler, Inhale 1-2 puffs into the lungs every 6 (six) hours as needed for wheezing or shortness of breath., Disp: 1 each, Rfl: 0   amantadine (SYMMETREL) 100 MG capsule, Take 100 mg by mouth 2 (two) times daily., Disp: , Rfl:    citalopram (CELEXA) 40 MG tablet, Take 1 tablet (40 mg total) by mouth daily., Disp: 90 tablet, Rfl: 2   levothyroxine (SYNTHROID) 100 MCG tablet, Take 1 tablet (100 mcg total) by mouth daily before breakfast., Disp: 90 tablet, Rfl: 2   ondansetron (ZOFRAN) 8 MG tablet,  Take 1 tablet (8 mg total) by mouth every 8 (eight) hours as needed for nausea., Disp: 20 tablet, Rfl: 2   oxybutynin (DITROPAN-XL) 5 MG 24 hr tablet, Take 1 tablet by mouth daily., Disp: , Rfl:    PRESCRIPTION MEDICATION, ocrevus infusions twice yearly, Disp: , Rfl:    propranolol ER (INDERAL LA) 80 MG 24 hr capsule, Take 1 capsule (80 mg total) by mouth daily., Disp: 90 capsule, Rfl: 1   Allergies: Allergies  Allergen Reactions   Sulfa Antibiotics Anaphylaxis   Darvocet [Propoxyphene N-Acetaminophen] Other (See Comments)    Reaction:  Hallucinations  Cephalexin Rash   Imitrex [Sumatriptan Base] Other (See Comments)    Reaction:  Makes pts jaw lock-up   Penicillins Rash and Other (See Comments)    Has patient had a PCN reaction causing immediate rash, facial/tongue/throat swelling, SOB or lightheadedness with hypotension: No Has patient had a PCN reaction causing severe rash involving mucus membranes or skin necrosis: No Has patient had a PCN reaction that required hospitalization: No Has patient had a PCN reaction occurring within the last 10 years: No If all of the above answers are "NO", then may proceed with Cephalosporin use.    REVIEW OF SYSTEMS:   Review of Systems  Constitutional:  Negative for chills, fatigue and fever.  HENT:   Negative for lump/mass, mouth sores, nosebleeds, sore throat and trouble swallowing.   Eyes:  Negative for eye problems.  Respiratory:  Negative for cough and shortness of breath.   Cardiovascular:  Negative for chest pain, leg swelling and palpitations.  Gastrointestinal:  Negative for abdominal pain, constipation, diarrhea, nausea and vomiting.  Genitourinary:  Positive for bladder incontinence. Negative for difficulty urinating, dysuria, frequency, hematuria and nocturia.   Musculoskeletal:  Negative for arthralgias, back pain, flank pain, myalgias and neck pain.  Skin:  Negative for itching and rash.  Neurological:  Negative for dizziness,  headaches and numbness.  Hematological:  Does not bruise/bleed easily.  Psychiatric/Behavioral:  Positive for sleep disturbance. Negative for depression and suicidal ideas. The patient is nervous/anxious.   All other systems reviewed and are negative.    VITALS:   Blood pressure 111/79, pulse (!) 57, temperature 98.3 F (36.8 C), temperature source Oral, resp. rate 17, height 5\' 9"  (1.753 m), weight 210 lb 11.2 oz (95.6 kg), SpO2 100 %.  Wt Readings from Last 3 Encounters:  08/03/22 210 lb 11.2 oz (95.6 kg)  06/18/22 209 lb 9.6 oz (95.1 kg)  05/08/21 253 lb (114.8 kg)    Body mass index is 31.11 kg/m.   PHYSICAL EXAM:   Physical Exam Vitals and nursing note reviewed. Exam conducted with a chaperone present.  Constitutional:      Appearance: Normal appearance.  Cardiovascular:     Rate and Rhythm: Normal rate and regular rhythm.     Pulses: Normal pulses.     Heart sounds: Normal heart sounds.  Pulmonary:     Effort: Pulmonary effort is normal.     Breath sounds: Normal breath sounds.  Abdominal:     Palpations: Abdomen is soft. There is no hepatomegaly, splenomegaly or mass.     Tenderness: There is no abdominal tenderness.  Musculoskeletal:     Right lower leg: No edema.     Left lower leg: No edema.  Lymphadenopathy:     Cervical: No cervical adenopathy.     Right cervical: No superficial, deep or posterior cervical adenopathy.    Left cervical: No superficial, deep or posterior cervical adenopathy.     Upper Body:     Right upper body: No supraclavicular or axillary adenopathy.     Left upper body: No supraclavicular or axillary adenopathy.  Neurological:     General: No focal deficit present.     Mental Status: She is alert and oriented to person, place, and time.  Psychiatric:        Mood and Affect: Mood normal.        Behavior: Behavior normal.     LABS:      Latest Ref Rng & Units 06/18/2022   10:06 AM 09/22/2020  4:57 AM 12/08/2018    4:37 AM  CBC   WBC 3.4 - 10.8 x10E3/uL 6.4  7.1  15.2   Hemoglobin 11.1 - 15.9 g/dL 16.1  09.6  04.5   Hematocrit 34.0 - 46.6 % 36.3  36.6  36.6   Platelets 150 - 450 x10E3/uL 330  293  252       Latest Ref Rng & Units 06/18/2022   10:06 AM 09/22/2020    4:57 AM 12/09/2018    4:36 AM  CMP  Glucose 70 - 99 mg/dL 92  409  811   BUN 6 - 20 mg/dL Creatinine 0.57 - 1.00 mg/dL 9.14  7.82  9.56   Sodium 134 - 144 mmol/L 138  133  138   Potassium 3.5 - 5.2 mmol/L 4.7  3.6  4.4   Chloride 96 - 106 mmol/L 101  103  107   CO2 20 - 29 mmol/L Calcium 8.7 - 10.2 mg/dL 9.4  8.4  8.9   Total Protein 6.0 - 8.5 g/dL 7.0     Total Bilirubin 0.0 - 1.2 mg/dL 0.4     Alkaline Phos 44 - 121 IU/L 64     AST 0 - 40 IU/L 14     ALT 0 - 32 IU/L 11        No results found for: "CEA1", "CEA" / No results found for: "CEA1", "CEA" No results found for: "PSA1" No results found for: "OZH086" No results found for: "CAN125"  No results found for: "TOTALPROTELP", "ALBUMINELP", "A1GS", "A2GS", "BETS", "BETA2SER", "GAMS", "MSPIKE", "SPEI" Lab Results  Component Value Date   TIBC 470 (H) 06/18/2022   FERRITIN 8 (L) 06/18/2022   IRONPCTSAT 7 (LL) 06/18/2022   No results found for: "LDH"   STUDIES:   No results found.

## 2022-08-03 ENCOUNTER — Inpatient Hospital Stay: Payer: 59 | Attending: Hematology | Admitting: Hematology

## 2022-08-03 VITALS — BP 111/79 | HR 57 | Temp 98.3°F | Resp 17 | Ht 69.0 in | Wt 210.7 lb

## 2022-08-03 DIAGNOSIS — D500 Iron deficiency anemia secondary to blood loss (chronic): Secondary | ICD-10-CM | POA: Diagnosis present

## 2022-08-03 DIAGNOSIS — N920 Excessive and frequent menstruation with regular cycle: Secondary | ICD-10-CM | POA: Diagnosis not present

## 2022-08-03 DIAGNOSIS — G35 Multiple sclerosis: Secondary | ICD-10-CM | POA: Diagnosis not present

## 2022-08-03 DIAGNOSIS — Z87891 Personal history of nicotine dependence: Secondary | ICD-10-CM | POA: Diagnosis not present

## 2022-08-03 DIAGNOSIS — D509 Iron deficiency anemia, unspecified: Secondary | ICD-10-CM

## 2022-08-03 NOTE — Patient Instructions (Signed)
[facility]  Discharge Instructions  You were seen and examined today by Dr. Ellin Saba. Dr. Ellin Saba is a hematologist, meaning that he specializes in blood abnormalities. Dr. Ellin Saba discussed your past medical history, family history of cancers/blood conditions and the events that led to you being here today.  You were referred to Dr. Ellin Saba due to anemia.  Dr. Ellin Saba has recommended two iron infusions.  Follow-up as scheduled.  Thank you for choosing [name] to provide your oncology and hematology care.   To afford each patient quality time with our provider, please arrive at least 15 minutes before your scheduled appointment time. You may need to reschedule your appointment if you arrive late (10 or more minutes). Arriving late affects you and other patients whose appointments are after yours.  Also, if you miss three or more appointments without notifying the office, you may be dismissed from the clinic at the provider's discretion.    Again, thank you for choosing [facility].  Our hope is that these requests will decrease the amount of time that you wait before being seen by our physicians.   If you have a lab appointment with the Cancer Center - please note that after April 8th, all labs will be drawn in the cancer center.  You do not have to check in or register with the main entrance as you have in the past but will complete your check-in at the cancer center.            _____________________________________________________________  Should you have questions after your visit to [facility], please contact our office at [phone] and follow the prompts.  Our office hours are 8:00 a.m. to 4:30 p.m. Monday - Thursday and 8:00 a.m. to 2:30 p.m. Friday.  Please note that voicemails left after 4:00 p.m. may not be returned until the following business day.  We are closed weekends and all  major holidays.  You do have access to a nurse 24-7, just call the main number to the clinic [phone] and do not press any options, hold on the line and a nurse will answer the phone.    For prescription refill requests, have your pharmacy contact our office and allow 72 hours.    Masks are no longer required in the cancer centers. If you would like for your care team to wear a mask while they are taking care of you, please let them know. You may have one support person who is at least 40 years old accompany you for your appointments.

## 2022-08-07 ENCOUNTER — Inpatient Hospital Stay: Payer: 59

## 2022-08-07 VITALS — BP 112/75 | HR 63 | Temp 97.2°F | Resp 18

## 2022-08-07 DIAGNOSIS — D509 Iron deficiency anemia, unspecified: Secondary | ICD-10-CM

## 2022-08-07 DIAGNOSIS — D500 Iron deficiency anemia secondary to blood loss (chronic): Secondary | ICD-10-CM | POA: Diagnosis not present

## 2022-08-07 MED ORDER — DIPHENHYDRAMINE HCL 50 MG/ML IJ SOLN
25.0000 mg | Freq: Once | INTRAMUSCULAR | Status: AC
  Administered 2022-08-07: 25 mg via INTRAVENOUS

## 2022-08-07 MED ORDER — FAMOTIDINE IN NACL 20-0.9 MG/50ML-% IV SOLN
20.0000 mg | Freq: Once | INTRAVENOUS | Status: AC
  Administered 2022-08-07: 20 mg via INTRAVENOUS
  Filled 2022-08-07: qty 50

## 2022-08-07 MED ORDER — SODIUM CHLORIDE 0.9 % IV SOLN
500.0000 mg | Freq: Once | INTRAVENOUS | Status: AC
  Administered 2022-08-07: 500 mg via INTRAVENOUS
  Filled 2022-08-07: qty 25

## 2022-08-07 MED ORDER — ACETAMINOPHEN 325 MG PO TABS
650.0000 mg | ORAL_TABLET | Freq: Once | ORAL | Status: AC
  Administered 2022-08-07: 650 mg via ORAL
  Filled 2022-08-07: qty 2

## 2022-08-07 MED ORDER — METHYLPREDNISOLONE SODIUM SUCC 125 MG IJ SOLR
125.0000 mg | Freq: Once | INTRAMUSCULAR | Status: AC
  Administered 2022-08-07: 125 mg via INTRAVENOUS
  Filled 2022-08-07: qty 2

## 2022-08-07 MED ORDER — CETIRIZINE HCL 10 MG PO TABS
10.0000 mg | ORAL_TABLET | Freq: Once | ORAL | Status: AC
  Administered 2022-08-07: 10 mg via ORAL
  Filled 2022-08-07: qty 1

## 2022-08-07 MED ORDER — SODIUM CHLORIDE 0.9 % IV SOLN: Freq: Once | INTRAVENOUS | Status: AC

## 2022-08-07 NOTE — Progress Notes (Signed)
Notified by RN of hypersensitivity reaction to iron, as further described in nurse note. Physical exam shows ankle edema and hives of bilateral lower extremities. Vital signs stable, lungs CTA bilaterally.  No wheezing or stridor.  No itching throat, tongue swelling, lip swelling, or shortness of breath.  Patient received premedication with p.o. Tylenol, IV Solu-Medrol, IV Pepcid, and p.o. Zyrtec.  We gave 25 mg IV Benadryl for HSR symptoms, with notable improvement in symptoms after 15 minutes.  We will add Venofer to her list of allergies and we will schedule her for Ferrlecit 250 mg x 2.  We will give premedication with p.o. Tylenol, IV cetirizine, IV Solu-Medrol, and IV Pepcid.  Carnella Guadalajara, PA-C  08/07/22 2:46 PM

## 2022-08-07 NOTE — Patient Instructions (Addendum)
[facility]  Discharge Instructions:   - - - - - - - - - - - - - - - - - -  You had mild ALLERGIC REACTION to your IV iron (Venofer/iron sucrose) today.  Since your symptoms improved with IV Benadryl, it is safe for you to return home.  You can continue to take over-the-counter Benadryl (diphenhydramine) 25 mg as needed for any worsening hives, and as recommended by instructions on bottle.  If you continue to have hives, swelling, or other symptoms after 24 hours, please call the clinic for further instructions.  If you have any new or worsening symptoms (especially any lip/face/throat/tongue swelling, difficulty breathing, chest pain, lightheadedness, or passing out) you should go immediately to the EMERGENCY DEPARTMENT.   - - - - - - - - - - - - - - - - - -  Thank you for choosing [facility] to provide your oncology and hematology care.  If you have a lab appointment with the Cancer Center - please note that after April 8th, 2024, all labs will be drawn in the cancer center.  You do not have to check in or register with the main entrance as you have in the past but will complete your check-in in the cancer center.  Wear comfortable clothing and clothing appropriate for easy access to any Portacath or PICC line.   We strive to give you quality time with your provider. You may need to reschedule your appointment if you arrive late (15 or more minutes).  Arriving late affects you and other patients whose appointments are after yours.  Also, if you miss three or more appointments without notifying the office, you may be dismissed from the clinic at the provider's discretion.      For prescription refill requests, have your pharmacy contact our office and allow 72 hours for refills to be completed.    Today you received the following chemotherapy and/or immunotherapy agents Venofer      To help prevent nausea and vomiting after your treatment, we encourage  you to take your nausea medication as directed.  BELOW ARE SYMPTOMS THAT SHOULD BE REPORTED IMMEDIATELY: *FEVER GREATER THAN 100.4 F (38 C) OR HIGHER *CHILLS OR SWEATING *NAUSEA AND VOMITING THAT IS NOT CONTROLLED WITH YOUR NAUSEA MEDICATION *UNUSUAL SHORTNESS OF BREATH *UNUSUAL BRUISING OR BLEEDING *URINARY PROBLEMS (pain or burning when urinating, or frequent urination) *BOWEL PROBLEMS (unusual diarrhea, constipation, pain near the anus) TENDERNESS IN MOUTH AND THROAT WITH OR WITHOUT PRESENCE OF ULCERS (sore throat, sores in mouth, or a toothache) UNUSUAL RASH, SWELLING OR PAIN  UNUSUAL VAGINAL DISCHARGE OR ITCHING   Items with * indicate a potential emergency and should be followed up as soon as possible or go to the Emergency Department if any problems should occur.  Please show the CHEMOTHERAPY ALERT CARD or IMMUNOTHERAPY ALERT CARD at check-in to the Emergency Department and triage nurse.  Should you have questions after your visit or need to cancel or reschedule your appointment, please contact [facility] [phone]  and follow the prompts.  Office hours are 8:00 a.m. to 4:30 p.m. Monday - Friday. Please note that voicemails left after 4:00 p.m. may not be returned until the following business day.  We are closed weekends and major holidays. You have access to a nurse at all times for urgent questions. Please call the main number to the clinic [phone] and follow the prompts.  For any non-urgent questions,  you may also contact your provider using MyChart. We now offer e-Visits for anyone 81 and older to request care online for non-urgent symptoms. For details visit [email or web address].   Also download the MyChart app! Go to the app store, search "MyChart", open the app, select [place], and log in with your MyChart username and password.

## 2022-08-07 NOTE — Progress Notes (Signed)
Hypersensitivity Reaction note  Date of event: 08/07/22 Time of event: 1350 Generic name of drug involved: iron sucrose Name of provider notified of the hypersensitivity reaction: Dr. Ellin Saba and Rojelio Brenner PA Was agent that likely caused hypersensitivity reaction added to Allergies List within EMR? yes Chain of events including reaction signs/symptoms, treatment administered, and outcome (e.g., drug resumed; drug discontinued; sent to Emergency Department; etc.) 1350 three minutes until wait time complete,  patient called out complaining of burning, swelling, and hives in the bilateral lower legs.  Vital signs stable and WNL.  1353 MD notified saline rate increasedto 999.  1359 c/o knee stiffness.  1402 hive now moving up lower calves.  1403 hand and fingers stiff. 1405 PA in with patient.  Vital signs still stable and WNL.  Slight SOB. 1410 25 mg IV benadryl given.  1422 Hives improving slightly, legs still burning and joints stiff.  PA notified.  Monitor for an additional 15 minutes per PA.  Patient has received 500 cc bolus of saline.  1440 Dr. Ellin Saba in with patient.  PA changing Iron in the treatment plan.  1455 per PA patient is free to go.  Patient advised that if symptoms worsen or new symptoms appear including chest pain or SOB to go directly to the Emergency department.  Patient agreeable.   Vital signs stable.  No complaints at this time.  Discharge from clinic ambulatory in stable condition.  Alert and oriented X 3.  Follow up with [facility] as scheduled.   Worthy Rancher, RN 08/07/2022 3:12 PM

## 2022-08-07 NOTE — Progress Notes (Signed)
Patient presents today for Venofer infusion per providers order.  Vital signs WNL.  Patient has no new complaints at this time.  Talked with the patient about potential side effects and possible staining of the skin with Venofer infusion.  Patient expressed understanding.  Peripheral IV started and blood return noted pre and post infusion.  1350 three minutes until wait time complete,  patient called out complaining of burning, swelling, and hives in the bilateral lower legs.  Vital signs stable and WNL.  1353 MD notified saline rate increasedto 999.  1359 c/o knee stiffness.  1402 hive now moving up lower calves.  1403 hand and fingers stiff. 1405 PA in with patient.  Vital signs still stable and WNL.  Slight SOB. 1410 25 mg IV benadryl given.  1422 Hives improving slightly, legs still burning and joints stiff.  PA notified.  Monitor for an additional 15 minutes per PA.  Patient has received 500 cc bolus of saline.  1440 Dr. Ellin Saba in with patient.  PA changing Iron in the treatment plan.  1455 per PA patient is free to go.  Patient advised that if symptoms worsen or new symptoms appear including chest pain or SOB to go directly to the Emergency department.  Patient agreeable.   Vital signs stable.  No complaints at this time.  Discharge from clinic ambulatory in stable condition.  Alert and oriented X 3.  Follow up with [facility] as scheduled.

## 2022-08-09 MED FILL — Sod Ferric Gluc Cmplx in Sucrose IV Soln 12.5 MG/ML (Fe Eq): INTRAVENOUS | Qty: 20 | Status: AC

## 2022-08-10 ENCOUNTER — Inpatient Hospital Stay: Payer: 59

## 2022-08-10 ENCOUNTER — Other Ambulatory Visit: Payer: Self-pay | Admitting: Physician Assistant

## 2022-08-10 NOTE — Progress Notes (Signed)
Ms. Finan presented for iron infusion today, but noted that after being released from clinic by Dr. Ellin Saba on 08/07/2022, she experienced a syncopal episode while waiting for her ride to pick her up at the main entrance.  She experienced general malaise for the next few days, although she is overall improving.  Hives and ankle edema have resolved.  We will hold off on giving any IV iron today and will decrease the dose of her Ferrlecit to give 125 mg infusion x 4 (rather than 250 mg x 2).  These will be given a week apart with extensive premedication.  Patient verbalizes understanding and agreement with the above plan.  Carnella Guadalajara, PA-C 08/10/22 11:54 AM

## 2022-08-10 NOTE — Progress Notes (Signed)
Will hold off on this infusion today per Rojelio Brenner PA-C. Will follow up as scheduled.

## 2022-08-12 ENCOUNTER — Encounter (HOSPITAL_COMMUNITY): Payer: Self-pay | Admitting: *Deleted

## 2022-08-12 ENCOUNTER — Emergency Department (HOSPITAL_COMMUNITY)
Admission: EM | Admit: 2022-08-12 | Discharge: 2022-08-12 | Disposition: A | Discharge status: Home / Self Care | Payer: 59 | Attending: Emergency Medicine | Admitting: Emergency Medicine

## 2022-08-12 ENCOUNTER — Other Ambulatory Visit: Payer: Self-pay

## 2022-08-12 ENCOUNTER — Emergency Department (HOSPITAL_COMMUNITY): Payer: 59

## 2022-08-12 DIAGNOSIS — R110 Nausea: Secondary | ICD-10-CM | POA: Diagnosis not present

## 2022-08-12 DIAGNOSIS — R1013 Epigastric pain: Secondary | ICD-10-CM | POA: Diagnosis present

## 2022-08-12 LAB — COMPREHENSIVE METABOLIC PANEL
ALT: 14 U/L (ref 0–44)
AST: 14 U/L — ABNORMAL LOW (ref 15–41)
Albumin: 4.4 g/dL (ref 3.5–5.0)
Alkaline Phosphatase: 51 U/L (ref 38–126)
Anion gap: 7 (ref 5–15)
BUN: 13 mg/dL (ref 6–20)
CO2: 26 mmol/L (ref 22–32)
Calcium: 8.9 mg/dL (ref 8.9–10.3)
Chloride: 103 mmol/L (ref 98–111)
Creatinine, Ser: 0.7 mg/dL (ref 0.44–1.00)
GFR, Estimated: >60 mL/min (ref >60)
Glucose, Bld: 82 mg/dL (ref 70–99)
Potassium: 3.6 mmol/L (ref 3.5–5.1)
Sodium: 136 mmol/L (ref 135–145)
Total Bilirubin: 0.8 mg/dL (ref 0.3–1.2)
Total Protein: 7.5 g/dL (ref 6.5–8.1)

## 2022-08-12 LAB — CBC WITH DIFFERENTIAL/PLATELET
Abs Immature Granulocytes: 0.04 10*3/uL (ref 0.00–0.07)
Basophils Absolute: 0.1 10*3/uL (ref 0.0–0.1)
Basophils Relative: 1 %
Eosinophils Absolute: 0.2 10*3/uL (ref 0.0–0.5)
Eosinophils Relative: 3 %
HCT: 38.9 % (ref 36.0–46.0)
Hemoglobin: 11.9 g/dL — ABNORMAL LOW (ref 12.0–15.0)
Immature Granulocytes: 1 %
Lymphocytes Relative: 19 %
Lymphs Abs: 1.1 10*3/uL (ref 0.7–4.0)
MCH: 24.8 pg — ABNORMAL LOW (ref 26.0–34.0)
MCHC: 30.6 g/dL (ref 30.0–36.0)
MCV: 81.2 fL (ref 80.0–100.0)
Monocytes Absolute: 0.5 10*3/uL (ref 0.1–1.0)
Monocytes Relative: 8 %
Neutro Abs: 4.2 10*3/uL (ref 1.7–7.7)
Neutrophils Relative %: 68 %
Platelets: 294 10*3/uL (ref 150–400)
RBC: 4.79 MIL/uL (ref 3.87–5.11)
RDW: 16.1 % — ABNORMAL HIGH (ref 11.5–15.5)
WBC: 6.1 10*3/uL (ref 4.0–10.5)
nRBC: 0 % (ref 0.0–0.2)

## 2022-08-12 LAB — URINALYSIS, ROUTINE W REFLEX MICROSCOPIC
Bilirubin Urine: NEGATIVE
Glucose, UA: NEGATIVE mg/dL
Hgb urine dipstick: NEGATIVE
Ketones, ur: NEGATIVE mg/dL
Leukocytes,Ua: NEGATIVE
Nitrite: NEGATIVE
Protein, ur: NEGATIVE mg/dL
Specific Gravity, Urine: 1.004 — ABNORMAL LOW (ref 1.005–1.030)
pH: 8 (ref 5.0–8.0)

## 2022-08-12 LAB — POC URINE PREG, ED: Preg Test, Ur: NEGATIVE

## 2022-08-12 LAB — I-STAT BETA HCG BLOOD, ED (MC, WL, AP ONLY): I-stat hCG, quantitative: <5 m[IU]/mL (ref <5)

## 2022-08-12 LAB — LIPASE, BLOOD: Lipase: 36 U/L (ref 11–51)

## 2022-08-12 MED ORDER — SUCRALFATE 1 G PO TABS: 1.0000 g | ORAL_TABLET | Freq: Three times a day (TID) | ORAL | 0 refills | Status: DC

## 2022-08-12 MED ORDER — OMEPRAZOLE 20 MG PO CPDR: 20.0000 mg | DELAYED_RELEASE_CAPSULE | Freq: Every day | ORAL | 0 refills | Status: DC

## 2022-08-12 MED ORDER — FENTANYL CITRATE PF 50 MCG/ML IJ SOSY
50.0000 ug | PREFILLED_SYRINGE | Freq: Once | INTRAMUSCULAR | Status: AC
  Administered 2022-08-12: 50 ug via INTRAVENOUS
  Filled 2022-08-12: qty 1

## 2022-08-12 MED ORDER — ONDANSETRON HCL 4 MG/2ML IJ SOLN
4.0000 mg | Freq: Once | INTRAMUSCULAR | Status: AC
  Administered 2022-08-12: 4 mg via INTRAVENOUS
  Filled 2022-08-12: qty 2

## 2022-08-12 MED ORDER — SODIUM CHLORIDE 0.9 % IV SOLN: INTRAVENOUS | Status: DC

## 2022-08-12 MED ORDER — IOHEXOL 9 MG/ML PO SOLN
500.0000 mL | ORAL | Status: AC
  Administered 2022-08-12 (×2): 500 mL via ORAL

## 2022-08-12 MED ORDER — IOHEXOL 300 MG/ML  SOLN
100.0000 mL | Freq: Once | INTRAMUSCULAR | Status: AC | PRN
  Administered 2022-08-12: 100 mL via INTRAVENOUS

## 2022-08-12 NOTE — ED Provider Notes (Signed)
[place] EMERGENCY DEPARTMENT AT [facility] Provider Note   CSN: [id number] Arrival date & time: 08/12/22  4098     History  Chief Complaint  Patient presents with   Abdominal Pain    Lisa [name] is a 42 y.o. female.  HPI Patient with multiple sclerosis presents with abdominal pain, nausea. Patient also has anemia and received iron transfusion earlier this week.  She notes that she developed allergic reaction to that, received medications, was able to go home, but has had worsening abdominal pain since that time, epigastric, nonradiating, worse with attempted eating.     Home Medications Prior to Admission medications   Medication Sig Start Date End Date Taking? Authorizing Provider  amantadine (SYMMETREL) 100 MG capsule Take 100 mg by mouth 2 (two) times daily. 09/09/20  Yes [provider]  baclofen (LIORESAL) 20 MG tablet Take 20 mg by mouth at bedtime as needed for muscle spasms.   Yes [provider]  Cholecalciferol (VITAMIN D-3 PO) Take 1 capsule by mouth daily.   Yes [provider]  citalopram (CELEXA) 40 MG tablet Take 1 tablet (40 mg total) by mouth daily. 06/18/22  Yes Luking, Jonna Coup, MD  Cyanocobalamin (VITAMIN B-12 PO) Take 1 tablet by mouth daily.   Yes [provider]  ibuprofen (ADVIL) 200 MG tablet Take 800 mg by mouth 2 (two) times daily as needed for headache or moderate pain.   Yes [provider]  levothyroxine (SYNTHROID) 100 MCG tablet Take 1 tablet (100 mcg total) by mouth daily before breakfast. 06/18/22  Yes Luking, Jonna Coup, MD  Ocrelizumab (OCREVUS IV) Inject 600 mg into the vein every 6 (six) months.   Yes [provider]  omeprazole (PRILOSEC) 20 MG capsule Take 1 capsule (20 mg total) by mouth daily. Take one tablet daily 08/12/22  Yes Gerhard Munch, MD  propranolol ER (INDERAL LA) 80 MG 24 hr capsule Take 1 capsule (80 mg total) by mouth daily. 06/19/22  Yes Luking, Jonna Coup, MD   rOPINIRole (REQUIP) 0.25 MG tablet Take 0.5 mg by mouth at bedtime as needed (muscle spasms).   Yes [provider]  sucralfate (CARAFATE) 1 g tablet Take 1 tablet (1 g total) by mouth 4 (four) times daily -  with meals and at bedtime. 08/12/22  Yes Gerhard Munch, MD  albuterol (VENTOLIN HFA) 108 (90 Base) MCG/ACT inhaler Inhale 1-2 puffs into the lungs every 6 (six) hours as needed for wheezing or shortness of breath. Patient not taking: Reported on 08/12/2022 05/02/21   Rhys Martini, PA-C      Allergies    Sulfa antibiotics, Venofer [iron sucrose], Darvocet [propoxyphene n-acetaminophen], Imitrex [sumatriptan base], Keflex [cephalexin], and Penicillins    Review of Systems   Review of Systems  All other systems reviewed and are negative.   Physical Exam Updated Vital Signs BP 125/78 (BP Location: Left Arm)   Pulse 70   Temp 98.6 F (37 C) (Oral)   Resp 18   Ht 5\' 9"  (1.753 m)   Wt 95.5 kg   LMP 07/29/2022   SpO2 100%   BMI 31.09 kg/m  Physical Exam Vitals and nursing note reviewed.  Constitutional:      General: She is not in acute distress.    Appearance: She is well-developed.  HENT:     Head: Normocephalic and atraumatic.  Eyes:     Conjunctiva/sclera: Conjunctivae normal.  Pulmonary:     Effort: Pulmonary effort is normal. No respiratory  distress.     Breath sounds: No stridor.  Abdominal:     General: There is no distension.  Skin:    General: Skin is warm and dry.  Neurological:     Mental Status: She is alert and oriented to person, place, and time.     Cranial Nerves: No cranial nerve deficit.  Psychiatric:        Mood and Affect: Mood normal.     ED Results / Procedures / Treatments   Labs (all labs ordered are listed, but only abnormal results are displayed) Labs Reviewed  COMPREHENSIVE METABOLIC PANEL - Abnormal; Notable for the following components:      Result Value   AST 14 (*)    All other components within normal limits  CBC  WITH DIFFERENTIAL/PLATELET - Abnormal; Notable for the following components:   Hemoglobin 11.9 (*)    MCH 24.8 (*)    RDW 16.1 (*)    All other components within normal limits  URINALYSIS, ROUTINE W REFLEX MICROSCOPIC - Abnormal; Notable for the following components:   Color, Urine COLORLESS (*)    Specific Gravity, Urine 1.004 (*)    All other components within normal limits  LIPASE, BLOOD  POC URINE PREG, ED  I-STAT BETA HCG BLOOD, ED ([facility], [facility], [facility] ONLY)    EKG None  Radiology CT ABDOMEN PELVIS W CONTRAST  Result Date: 08/12/2022 CLINICAL DATA:  Upper abdominal pain EXAM: CT ABDOMEN AND PELVIS WITH CONTRAST TECHNIQUE: Multidetector CT imaging of the abdomen and pelvis was performed using the standard protocol following bolus administration of intravenous contrast. RADIATION DOSE REDUCTION: This exam was performed according to the departmental dose-optimization program which includes automated exposure control, adjustment of the mA and/or kV according to patient size and/or use of iterative reconstruction technique. CONTRAST:  OMNIPAQUE IOHEXOL 300 MG/ML  SOLN COMPARISON:  08/20/2012 FINDINGS: Lower chest: Unremarkable Hepatobiliary: Unremarkable Pancreas: Unremarkable Spleen: Unremarkable Adrenals/Urinary Tract: Right extrarenal pelvis and borderline right proximal hydroureter, but the ureter is of relatively normal caliber past the iliac vessel cross over and no stone is identified. No overt hydronephrosis. Urinary bladder unremarkable. No significant renal lesion identified. 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 No further imaging workup of this lesion is indicated. Stomach/Bowel: Unremarkable. Normal appendix. No findings of diverticulosis or diverticulitis. Vascular/Lymphatic: Unremarkable Reproductive: New masslike structure along the left uterine fundus/adnexa, probably just above the ovary rather than part of the ovary itself, measuring 3.9 by 3.5 by 3.1 cm.  This is probably a new sub a subserosal fibroid given similar density to the myometrium. There is also a new exophytic 1.3 cm lesion from the left posterior fundus on image 63 series 2, likewise probably a small subserosal fibroid. Small anterior uterine fibroid 1.2 cm in diameter on image 82 series 6. No endometrial thickening. Mildly irregular thin rim enhancing lesion of the right ovary measures 3.1 by 2.5 by 2.2 cm, most likely a corpus luteum or collapsed cyst given the appearance and configuration. Internal fluid density. Urine pregnancy test was negative today hence ectopic pregnancy is not suspected. Other: Trace free pelvic fluid in the cul-de-sac. Musculoskeletal: Transitional lumbosacral vertebra. IMPRESSION: 1. A specific cause for the patient's upper abdominal pain is not identified. 2. Right extrarenal pelvis and borderline right proximal hydroureter, but the ureter is of relatively normal caliber past the iliac vessel cross over and no stone is identified. No hydronephrosis. 3. Newly appreciable uterine fibroids including at least 2 subserosal fibroids. This includes a  notable subserosal fibroid extending along the adnexa above the ovary on the left. This is of similar density to the myometrium and given the presence of other fibroids is highly likely to be a benign fibroid. 4. Mildly irregular thin rim enhancing lesion of the right ovary measures 3.1 by 2.5 by 2.2 cm, most likely a corpus luteum or collapsed cyst given the appearance and configuration. 5. Trace free pelvic fluid in the cul-de-sac. 6. 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 No further imaging workup of this lesion is indicated. 7. Transitional lumbosacral vertebra. Electronically Signed   By: Gaylyn Rong M.D.   On: 08/12/2022 14:41    Procedures Procedures    Medications Ordered in ED Medications  0.9 %  sodium chloride infusion ( Intravenous New Bag/Given 08/12/22 1129)  fentaNYL  (SUBLIMAZE) injection 50 mcg (50 mcg Intravenous Given 08/12/22 1130)  ondansetron (ZOFRAN) injection 4 mg (4 mg Intravenous Given 08/12/22 1126)  iohexol (OMNIPAQUE) 9 MG/ML oral solution 500 mL (500 mLs Oral Contrast Given 08/12/22 1230)  iohexol (OMNIPAQUE) 300 MG/ML solution 100 mL (100 mLs Intravenous Contrast Given 08/12/22 1346)    ED Course/ Medical Decision Making/ A&P                             Medical Decision Making Adult female with multiple medical problems including MS, reflux, recent allergic reaction now presents with abdominal pain.  Concern for gastroesophageal etiology, but anaphylaxis with subsequent edematous mesentery was a consideration. Patient had labs CT.  Cardiac 70 sinus normal Pulse ox 100% room air normal   Amount and/or Complexity of Data Reviewed Labs: ordered. Radiology: ordered.  Risk Prescription drug management.   3:02 PM Patient awake, alert, in no distress, speaking clearly.  No ongoing complaints.  I reviewed her CT scan, discussed this as well as her labs with her. Patient had a OB follow-up earlier in the week is aware of her uterine fibroids. We discussed the otherwise generally reassuring findings, some suspicion for gastroesophageal discomfort given her prandial association with pain. Patient will start a new medication regimen, follow-up with her physician as an outpatient tomorrow.        Final Clinical Impression(s) / ED Diagnoses Final diagnoses:  Epigastric pain    Rx / DC Orders ED Discharge Orders          Ordered    sucralfate (CARAFATE) 1 g tablet  3 times daily with meals & bedtime       Note to Pharmacy: Take for one week   08/12/22 1502    omeprazole (PRILOSEC) 20 MG capsule  Daily        08/12/22 1502              Gerhard Munch, MD 08/12/22 1502

## 2022-08-12 NOTE — ED Triage Notes (Signed)
Pt c/o left upper abdominal pain; pt states she has MS and states she had an iron infusion on Tuesday last week in which she had an anaphylactic reaction, pt states since then she has been overall "not well"  Pt states her BM have been "ribbon-like" in size and light in color  Pt states she has been following up with her PCP trying to find out a cause for her symptoms

## 2022-08-12 NOTE — Discharge Instructions (Signed)
As discussed, your evaluation today has been largely reassuring.  But, it is important that you monitor your condition carefully, and do not hesitate to return to the ED if you develop new, or concerning changes in your condition. ? ?Otherwise, please follow-up with your physician for appropriate ongoing care. ? ?

## 2022-08-13 ENCOUNTER — Telehealth: Payer: Self-pay

## 2022-08-13 NOTE — Transitions of Care (Post Inpatient/ED Visit) (Signed)
   08/13/2022  Name: Lisa [name] MRN: [id number] DOB: 03-May-1980  Today's TOC FU Call Status: Today's TOC FU Call Status:: Successful TOC FU Call Competed TOC FU Call Complete Date: 08/13/22  Transition Care Management Follow-up Telephone Call Date of Discharge: 08/11/22 [facility] Type of Discharge: Emergency Department Reason for ED Visit: Other: (gastric pain) How have you been since you were released from the hospital?: Better Any questions or concerns?: No  Items Reviewed: Did you receive and understand the discharge instructions provided?: Yes Medications obtained and verified?: Yes (Medications Reviewed) Any new allergies since your discharge?: No Dietary orders reviewed?: Yes Do you have support at home?: Yes People in Home: spouse  Home Care and Equipment/Supplies: Were Home Health Services Ordered?: NA Any new equipment or medical supplies ordered?: NA  Functional Questionnaire: Do you need assistance with bathing/showering or dressing?: No Do you need assistance with meal preparation?: No Do you need assistance with eating?: No Do you have difficulty maintaining continence: No Do you need assistance with getting out of bed/getting out of a chair/moving?: No Do you have difficulty managing or taking your medications?: No  Follow up appointments reviewed: PCP Follow-up appointment confirmed?: NA Specialist Hospital Follow-up appointment confirmed?: Yes Date of Specialist follow-up appointment?: 08/20/22 Follow-Up Specialty Provider:: [facility] Do you need transportation to your follow-up appointment?: No Do you understand care options if your condition(s) worsen?: Yes-patient verbalized understanding    SIGNATURE Karena Addison, LPN [facility] Nurse Health Advisor Direct Dial [phone]

## 2022-08-16 ENCOUNTER — Inpatient Hospital Stay: Payer: 59

## 2022-08-20 ENCOUNTER — Inpatient Hospital Stay: Payer: 59 | Attending: Hematology

## 2022-08-20 VITALS — BP 106/72 | HR 55 | Temp 97.7°F | Resp 16

## 2022-08-20 DIAGNOSIS — D509 Iron deficiency anemia, unspecified: Secondary | ICD-10-CM

## 2022-08-20 DIAGNOSIS — D500 Iron deficiency anemia secondary to blood loss (chronic): Secondary | ICD-10-CM | POA: Diagnosis present

## 2022-08-20 DIAGNOSIS — D259 Leiomyoma of uterus, unspecified: Secondary | ICD-10-CM | POA: Diagnosis not present

## 2022-08-20 MED ORDER — SODIUM CHLORIDE 0.9 % IV SOLN
125.0000 mg | Freq: Once | INTRAVENOUS | Status: AC
  Administered 2022-08-20: 125 mg via INTRAVENOUS
  Filled 2022-08-20: qty 10

## 2022-08-20 MED ORDER — METHYLPREDNISOLONE SODIUM SUCC 125 MG IJ SOLR
125.0000 mg | Freq: Once | INTRAMUSCULAR | Status: AC
  Administered 2022-08-20: 125 mg via INTRAVENOUS
  Filled 2022-08-20: qty 2

## 2022-08-20 MED ORDER — CETIRIZINE HCL 10 MG/ML IV SOLN
10.0000 mg | Freq: Once | INTRAVENOUS | Status: AC
  Administered 2022-08-20: 10 mg via INTRAVENOUS
  Filled 2022-08-20: qty 1

## 2022-08-20 MED ORDER — FAMOTIDINE 20 MG IN NS 100 ML IVPB
20.0000 mg | Freq: Once | INTRAVENOUS | Status: AC
  Administered 2022-08-20: 20 mg via INTRAVENOUS
  Filled 2022-08-20: qty 100

## 2022-08-20 MED ORDER — ACETAMINOPHEN 325 MG PO TABS
650.0000 mg | ORAL_TABLET | Freq: Once | ORAL | Status: AC
  Administered 2022-08-20: 650 mg via ORAL

## 2022-08-20 MED ORDER — SODIUM CHLORIDE 0.9 % IV SOLN: Freq: Once | INTRAVENOUS | Status: AC

## 2022-08-20 MED ORDER — FAMOTIDINE IN NACL 20-0.9 MG/50ML-% IV SOLN: 20.0000 mg | Freq: Once | INTRAVENOUS | Status: DC

## 2022-08-20 NOTE — Progress Notes (Signed)
Patient presents today for iron infusion.  Patient is in satisfactory condition with no new complaints voiced.  Vital signs are stable.  IV placed in right AC.  IV flushed well with good blood return noted.  We will proceed with infusion per provider orders.   Patient tolerated treatment well with no complaints voiced.  Patient left ambulatory in stable condition.  Vital signs stable at discharge.  Follow up as scheduled.    

## 2022-08-20 NOTE — Patient Instructions (Signed)
[facility]  Discharge Instructions: Thank you for choosing [facility] to provide your oncology and hematology care.  If you have a lab appointment with the Cancer Center - please note that after April 8th, 2024, all labs will be drawn in the cancer center.  You do not have to check in or register with the main entrance as you have in the past but will complete your check-in in the cancer center.  Wear comfortable clothing and clothing appropriate for easy access to any Portacath or PICC line.   We strive to give you quality time with your provider. You may need to reschedule your appointment if you arrive late (15 or more minutes).  Arriving late affects you and other patients whose appointments are after yours.  Also, if you miss three or more appointments without notifying the office, you may be dismissed from the clinic at the provider's discretion.      For prescription refill requests, have your pharmacy contact our office and allow 72 hours for refills to be completed.    Sodium Ferric Gluconate Complex Injection What is this medication? SODIUM FERRIC GLUCONATE COMPLEX (SOE dee um FER ik GLOO koe nate KOM pleks) treats low levels of iron (iron deficiency anemia) in people with kidney disease. Iron is a mineral that plays an important role in making red blood cells, which carry oxygen from your lungs to the rest of your body. This medicine may be used for other purposes; ask your health care provider or pharmacist if you have questions. COMMON BRAND NAME(S): Ferrlecit, Nulecit What should I tell my care team before I take this medication? They need to know if you have any of the following conditions: Anemia that is not from iron deficiency High levels of iron in the blood An unusual or allergic reaction to iron, other medications, foods, dyes, or preservatives Pregnant or are trying to become pregnant Breast-feeding How should I use this  medication? This medication is injected into a vein. It is given by your care team in a hospital or clinic setting. Talk to your care team about the use of this medication in children. While it may be prescribed for children as young as 6 years for selected conditions, precautions do apply. Overdosage: If you think you have taken too much of this medicine contact a poison control center or emergency room at once. NOTE: This medicine is only for you. Do not share this medicine with others. What if I miss a dose? It is important not to miss your dose. Call your care team if you are unable to keep an appointment. What may interact with this medication? Do not take this medication with any of the following: Deferasirox Deferoxamine Dimercaprol This medication may also interact with the following: Other iron products This list may not describe all possible interactions. Give your health care provider a list of all the medicines, herbs, non-prescription drugs, or dietary supplements you use. Also tell them if you smoke, drink alcohol, or use illegal drugs. Some items may interact with your medicine. What should I watch for while using this medication? Your condition will be monitored carefully while you are receiving this medication. Visit your care team for regular checks on your progress. You may need blood work while you are taking this medication. What side effects may I notice from receiving this medication? Side effects that you should report to your care team as soon as possible: Allergic reactions--skin rash, itching, hives, swelling  of the face, lips, tongue, or throat Low blood pressure--dizziness, feeling faint or lightheaded, blurry vision Shortness of breath Side effects that usually do not require medical attention (report to your care team if they continue or are bothersome): Flushing Headache Joint pain Muscle pain Nausea Pain, redness, or irritation at injection site This  list may not describe all possible side effects. Call your doctor for medical advice about side effects. You may report side effects to FDA at 1-800-FDA-1088. Where should I keep my medication? This medication is given in a hospital or clinic and will not be stored at home. NOTE: This sheet is a summary. It may not cover all possible information. If you have questions about this medicine, talk to your doctor, pharmacist, or health care provider.  2023 Elsevier/Gold Standard (2020-08-26 00:00:00)    To help prevent nausea and vomiting after your treatment, we encourage you to take your nausea medication as directed.  BELOW ARE SYMPTOMS THAT SHOULD BE REPORTED IMMEDIATELY: *FEVER GREATER THAN 100.4 F (38 C) OR HIGHER *CHILLS OR SWEATING *NAUSEA AND VOMITING THAT IS NOT CONTROLLED WITH YOUR NAUSEA MEDICATION *UNUSUAL SHORTNESS OF BREATH *UNUSUAL BRUISING OR BLEEDING *URINARY PROBLEMS (pain or burning when urinating, or frequent urination) *BOWEL PROBLEMS (unusual diarrhea, constipation, pain near the anus) TENDERNESS IN MOUTH AND THROAT WITH OR WITHOUT PRESENCE OF ULCERS (sore throat, sores in mouth, or a toothache) UNUSUAL RASH, SWELLING OR PAIN  UNUSUAL VAGINAL DISCHARGE OR ITCHING   Items with * indicate a potential emergency and should be followed up as soon as possible or go to the Emergency Department if any problems should occur.  Please show the CHEMOTHERAPY ALERT CARD or IMMUNOTHERAPY ALERT CARD at check-in to the Emergency Department and triage nurse.  Should you have questions after your visit or need to cancel or reschedule your appointment, please contact [facility]ENTER AT [facility] [phone]  and follow the prompts.  Office hours are 8:00 a.m. to 4:30 p.m. Monday - Friday. Please note that voicemails left after 4:00 p.m. may not be returned until the following business day.  We are closed weekends and major holidays. You have access to a nurse at all times for urgent  questions. Please call the main number to the clinic [phone] and follow the prompts.  For any non-urgent questions, you may also contact your provider using MyChart. We now offer e-Visits for anyone 30 and older to request care online for non-urgent symptoms. For details visit [email or web address].   Also download the MyChart app! Go to the app store, search "MyChart", open the app, select [place], and log in with your MyChart username and password.

## 2022-08-30 ENCOUNTER — Inpatient Hospital Stay: Payer: 59

## 2022-08-30 VITALS — BP 107/69 | HR 64 | Temp 97.4°F | Resp 18

## 2022-08-30 DIAGNOSIS — D509 Iron deficiency anemia, unspecified: Secondary | ICD-10-CM

## 2022-08-30 DIAGNOSIS — D500 Iron deficiency anemia secondary to blood loss (chronic): Secondary | ICD-10-CM | POA: Diagnosis not present

## 2022-08-30 MED ORDER — CETIRIZINE HCL 10 MG/ML IV SOLN
10.0000 mg | Freq: Once | INTRAVENOUS | Status: AC
  Administered 2022-08-30: 10 mg via INTRAVENOUS
  Filled 2022-08-30: qty 1

## 2022-08-30 MED ORDER — SODIUM CHLORIDE 0.9 % IV SOLN
125.0000 mg | Freq: Once | INTRAVENOUS | Status: AC
  Administered 2022-08-30: 125 mg via INTRAVENOUS
  Filled 2022-08-30: qty 10

## 2022-08-30 MED ORDER — FAMOTIDINE IN NACL 20-0.9 MG/50ML-% IV SOLN
20.0000 mg | Freq: Once | INTRAVENOUS | Status: AC
  Administered 2022-08-30: 20 mg via INTRAVENOUS
  Filled 2022-08-30: qty 50

## 2022-08-30 MED ORDER — METHYLPREDNISOLONE SODIUM SUCC 125 MG IJ SOLR
125.0000 mg | Freq: Once | INTRAMUSCULAR | Status: AC
  Administered 2022-08-30: 125 mg via INTRAVENOUS
  Filled 2022-08-30: qty 2

## 2022-08-30 MED ORDER — SODIUM CHLORIDE 0.9 % IV SOLN: Freq: Once | INTRAVENOUS | Status: AC

## 2022-08-30 MED ORDER — ACETAMINOPHEN 325 MG PO TABS
650.0000 mg | ORAL_TABLET | Freq: Once | ORAL | Status: AC
  Administered 2022-08-30: 650 mg via ORAL
  Filled 2022-08-30: qty 2

## 2022-08-30 NOTE — Progress Notes (Signed)
Patient presents today for iron infusion. Patient is in satisfactory condition with no new complaints voiced.  Vital signs are stable.  We will proceed with infusion per provider orders.    Peripheral IV started with good blood return pre and post infusion.  Ferrlecit 125 mg given today per MD orders. Tolerated infusion without adverse affects. Vital signs stable. No complaints at this time. Discharged from clinic ambulatory in stable condition. Alert and oriented x 3. F/U with [facility] as scheduled.

## 2022-08-30 NOTE — Patient Instructions (Signed)
[facility]  Discharge Instructions: Thank you for choosing [facility] to provide your oncology and hematology care.  If you have a lab appointment with the Cancer Center - please note that after April 8th, 2024, all labs will be drawn in the cancer center.  You do not have to check in or register with the main entrance as you have in the past but will complete your check-in in the cancer center.  Wear comfortable clothing and clothing appropriate for easy access to any Portacath or PICC line.   We strive to give you quality time with your provider. You may need to reschedule your appointment if you arrive late (15 or more minutes).  Arriving late affects you and other patients whose appointments are after yours.  Also, if you miss three or more appointments without notifying the office, you may be dismissed from the clinic at the provider's discretion.      For prescription refill requests, have your pharmacy contact our office and allow 72 hours for refills to be completed.    Today you received Ferlecit 125 mg  BELOW ARE SYMPTOMS THAT SHOULD BE REPORTED IMMEDIATELY: *FEVER GREATER THAN 100.4 F (38 C) OR HIGHER *CHILLS OR SWEATING *NAUSEA AND VOMITING THAT IS NOT CONTROLLED WITH YOUR NAUSEA MEDICATION *UNUSUAL SHORTNESS OF BREATH *UNUSUAL BRUISING OR BLEEDING *URINARY PROBLEMS (pain or burning when urinating, or frequent urination) *BOWEL PROBLEMS (unusual diarrhea, constipation, pain near the anus) TENDERNESS IN MOUTH AND THROAT WITH OR WITHOUT PRESENCE OF ULCERS (sore throat, sores in mouth, or a toothache) UNUSUAL RASH, SWELLING OR PAIN  UNUSUAL VAGINAL DISCHARGE OR ITCHING   Items with * indicate a potential emergency and should be followed up as soon as possible or go to the Emergency Department if any problems should occur.  Please show the CHEMOTHERAPY ALERT CARD or IMMUNOTHERAPY ALERT CARD at check-in to the Emergency Department and triage  nurse.  Should you have questions after your visit or need to cancel or reschedule your appointment, please contact [facility] CENTER AT [facility] [phone]  and follow the prompts.  Office hours are 8:00 a.m. to 4:30 p.m. Monday - Friday. Please note that voicemails left after 4:00 p.m. may not be returned until the following business day.  We are closed weekends and major holidays. You have access to a nurse at all times for urgent questions. Please call the main number to the clinic [phone] and follow the prompts.  For any non-urgent questions, you may also contact your provider using MyChart. We now offer e-Visits for anyone 67 and older to request care online for non-urgent symptoms. For details visit [email or web address].   Also download the MyChart app! Go to the app store, search "MyChart", open the app, select [place], and log in with your MyChart username and password.

## 2022-09-06 ENCOUNTER — Inpatient Hospital Stay: Payer: 59

## 2022-09-06 VITALS — BP 100/64 | HR 59 | Temp 97.9°F | Resp 18 | Wt 210.6 lb

## 2022-09-06 DIAGNOSIS — D500 Iron deficiency anemia secondary to blood loss (chronic): Secondary | ICD-10-CM | POA: Diagnosis not present

## 2022-09-06 DIAGNOSIS — D509 Iron deficiency anemia, unspecified: Secondary | ICD-10-CM

## 2022-09-06 MED ORDER — SODIUM CHLORIDE 0.9 % IV SOLN
125.0000 mg | Freq: Once | INTRAVENOUS | Status: AC
  Administered 2022-09-06: 125 mg via INTRAVENOUS
  Filled 2022-09-06: qty 10

## 2022-09-06 MED ORDER — METHYLPREDNISOLONE SODIUM SUCC 125 MG IJ SOLR
125.0000 mg | Freq: Once | INTRAMUSCULAR | Status: AC
  Administered 2022-09-06: 125 mg via INTRAVENOUS
  Filled 2022-09-06: qty 2

## 2022-09-06 MED ORDER — CETIRIZINE HCL 10 MG/ML IV SOLN
10.0000 mg | Freq: Once | INTRAVENOUS | Status: AC
  Administered 2022-09-06: 10 mg via INTRAVENOUS
  Filled 2022-09-06: qty 1

## 2022-09-06 MED ORDER — SODIUM CHLORIDE 0.9 % IV SOLN: Freq: Once | INTRAVENOUS | Status: AC

## 2022-09-06 MED ORDER — FAMOTIDINE IN NACL 20-0.9 MG/50ML-% IV SOLN
20.0000 mg | Freq: Once | INTRAVENOUS | Status: AC
  Administered 2022-09-06: 20 mg via INTRAVENOUS
  Filled 2022-09-06: qty 50

## 2022-09-06 MED ORDER — ACETAMINOPHEN 325 MG PO TABS
650.0000 mg | ORAL_TABLET | Freq: Once | ORAL | Status: AC
  Administered 2022-09-06: 650 mg via ORAL
  Filled 2022-09-06: qty 2

## 2022-09-06 NOTE — Progress Notes (Signed)
Patient presents today for iron infusion.  Patient is in satisfactory condition with no new complaints voiced.  IV placed in R arm. IV flushed well with good blood return noted.  Vital signs are stable.  We will proceed with infusion per provider orders.    Patient called out with pain in her IV site.  No redness or edema noted.  Iron stopped.  IV site relocated to L arm.  IV flushed well with good blood return noted.    Patient tolerated infusion well with no complaints voiced.  Patient left ambulatory in stable condition.  Vital signs stable at discharge.  Follow up as scheduled.

## 2022-09-06 NOTE — Patient Instructions (Signed)
[facility] [place]  Discharge Instructions: Thank you for choosing [facility] to provide your oncology and hematology care.  If you have a lab appointment with the Cancer Center - please note that after April 8th, 2024, all labs will be drawn in the cancer center.  You do not have to check in or register with the main entrance as you have in the past but will complete your check-in in the cancer center.  Wear comfortable clothing and clothing appropriate for easy access to any Portacath or PICC line.   We strive to give you quality time with your provider. You may need to reschedule your appointment if you arrive late (15 or more minutes).  Arriving late affects you and other patients whose appointments are after yours.  Also, if you miss three or more appointments without notifying the office, you may be dismissed from the clinic at the provider's discretion.      For prescription refill requests, have your pharmacy contact our office and allow 72 hours for refills to be completed.    Sodium Ferric Gluconate Complex Injection What is this medication? SODIUM FERRIC GLUCONATE COMPLEX (SOE dee um FER ik GLOO koe nate KOM pleks) treats low levels of iron (iron deficiency anemia) in people with kidney disease. Iron is a mineral that plays an important role in making red blood cells, which carry oxygen from your lungs to the rest of your body. This medicine may be used for other purposes; ask your health care provider or pharmacist if you have questions. COMMON BRAND NAME(S): Ferrlecit, Nulecit What should I tell my care team before I take this medication? They need to know if you have any of the following conditions: Anemia that is not from iron deficiency High levels of iron in the blood An unusual or allergic reaction to iron, other medications, foods, dyes, or preservatives Pregnant or are trying to become pregnant Breast-feeding How should I use this  medication? This medication is injected into a vein. It is given by your care team in a hospital or clinic setting. Talk to your care team about the use of this medication in children. While it may be prescribed for children as young as 6 years for selected conditions, precautions do apply. Overdosage: If you think you have taken too much of this medicine contact a poison control center or emergency room at once. NOTE: This medicine is only for you. Do not share this medicine with others. What if I miss a dose? It is important not to miss your dose. Call your care team if you are unable to keep an appointment. What may interact with this medication? Do not take this medication with any of the following: Deferasirox Deferoxamine Dimercaprol This medication may also interact with the following: Other iron products This list may not describe all possible interactions. Give your health care provider a list of all the medicines, herbs, non-prescription drugs, or dietary supplements you use. Also tell them if you smoke, drink alcohol, or use illegal drugs. Some items may interact with your medicine. What should I watch for while using this medication? Your condition will be monitored carefully while you are receiving this medication. Visit your care team for regular checks on your progress. You may need blood work while you are taking this medication. What side effects may I notice from receiving this medication? Side effects that you should report to your care team as soon as possible: Allergic reactions--skin rash, itching, hives, swelling   of the face, lips, tongue, or throat Low blood pressure--dizziness, feeling faint or lightheaded, blurry vision Shortness of breath Side effects that usually do not require medical attention (report to your care team if they continue or are bothersome): Flushing Headache Joint pain Muscle pain Nausea Pain, redness, or irritation at injection site This  list may not describe all possible side effects. Call your doctor for medical advice about side effects. You may report side effects to FDA at 1-800-FDA-1088. Where should I keep my medication? This medication is given in a hospital or clinic and will not be stored at home. NOTE: This sheet is a summary. It may not cover all possible information. If you have questions about this medicine, talk to your doctor, pharmacist, or health care provider.  2023 Elsevier/Gold Standard (2020-08-26 00:00:00)    To help prevent nausea and vomiting after your treatment, we encourage you to take your nausea medication as directed.  BELOW ARE SYMPTOMS THAT SHOULD BE REPORTED IMMEDIATELY: *FEVER GREATER THAN 100.4 F (38 C) OR HIGHER *CHILLS OR SWEATING *NAUSEA AND VOMITING THAT IS NOT CONTROLLED WITH YOUR NAUSEA MEDICATION *UNUSUAL SHORTNESS OF BREATH *UNUSUAL BRUISING OR BLEEDING *URINARY PROBLEMS (pain or burning when urinating, or frequent urination) *BOWEL PROBLEMS (unusual diarrhea, constipation, pain near the anus) TENDERNESS IN MOUTH AND THROAT WITH OR WITHOUT PRESENCE OF ULCERS (sore throat, sores in mouth, or a toothache) UNUSUAL RASH, SWELLING OR PAIN  UNUSUAL VAGINAL DISCHARGE OR ITCHING   Items with * indicate a potential emergency and should be followed up as soon as possible or go to the Emergency Department if any problems should occur.  Please show the CHEMOTHERAPY ALERT CARD or IMMUNOTHERAPY ALERT CARD at check-in to the Emergency Department and triage nurse.  Should you have questions after your visit or need to cancel or reschedule your appointment, please contact [facility] [place] [phone]  and follow the prompts.  Office hours are 8:00 a.m. to 4:30 p.m. Monday - Friday. Please note that voicemails left after 4:00 p.m. may not be returned until the following business day.  We are closed weekends and major holidays. You have access to a nurse at all times for urgent  questions. Please call the main number to the clinic [phone] and follow the prompts.  For any non-urgent questions, you may also contact your provider using MyChart. We now offer e-Visits for anyone 18 and older to request care online for non-urgent symptoms. For details visit [email or web address].   Also download the MyChart app! Go to the app store, search "MyChart", open the app, select [facility], and log in with your MyChart username and password.   

## 2022-09-13 ENCOUNTER — Inpatient Hospital Stay: Payer: 59

## 2022-09-13 VITALS — BP 103/61 | HR 63 | Temp 96.4°F | Resp 18

## 2022-09-13 DIAGNOSIS — D509 Iron deficiency anemia, unspecified: Secondary | ICD-10-CM

## 2022-09-13 DIAGNOSIS — D500 Iron deficiency anemia secondary to blood loss (chronic): Secondary | ICD-10-CM | POA: Diagnosis not present

## 2022-09-13 MED ORDER — FAMOTIDINE IN NACL 20-0.9 MG/50ML-% IV SOLN
20.0000 mg | Freq: Once | INTRAVENOUS | Status: AC
  Administered 2022-09-13: 20 mg via INTRAVENOUS
  Filled 2022-09-13: qty 50

## 2022-09-13 MED ORDER — METHYLPREDNISOLONE SODIUM SUCC 125 MG IJ SOLR
125.0000 mg | Freq: Once | INTRAMUSCULAR | Status: AC
  Administered 2022-09-13: 125 mg via INTRAVENOUS
  Filled 2022-09-13: qty 2

## 2022-09-13 MED ORDER — ACETAMINOPHEN 325 MG PO TABS
650.0000 mg | ORAL_TABLET | Freq: Once | ORAL | Status: AC
  Administered 2022-09-13: 650 mg via ORAL
  Filled 2022-09-13: qty 2

## 2022-09-13 MED ORDER — CETIRIZINE HCL 10 MG/ML IV SOLN
10.0000 mg | Freq: Once | INTRAVENOUS | Status: AC
  Administered 2022-09-13: 10 mg via INTRAVENOUS
  Filled 2022-09-13: qty 1

## 2022-09-13 MED ORDER — SODIUM CHLORIDE 0.9 % IV SOLN
125.0000 mg | Freq: Once | INTRAVENOUS | Status: AC
  Administered 2022-09-13: 125 mg via INTRAVENOUS
  Filled 2022-09-13: qty 10

## 2022-09-13 MED ORDER — SODIUM CHLORIDE 0.9 % IV SOLN: Freq: Once | INTRAVENOUS | Status: AC

## 2022-09-13 NOTE — Patient Instructions (Signed)
[facility]  Discharge Instructions: Thank you for choosing [facility] to provide your oncology and hematology care.  If you have a lab appointment with the Cancer Center - please note that after April 8th, 2024, all labs will be drawn in the cancer center.  You do not have to check in or register with the main entrance as you have in the past but will complete your check-in in the cancer center.  Wear comfortable clothing and clothing appropriate for easy access to any Portacath or PICC line.   We strive to give you quality time with your provider. You may need to reschedule your appointment if you arrive late (15 or more minutes).  Arriving late affects you and other patients whose appointments are after yours.  Also, if you miss three or more appointments without notifying the office, you may be dismissed from the clinic at the provider's discretion.      For prescription refill requests, have your pharmacy contact our office and allow 72 hours for refills to be completed.    Iron Dextran Injection What is this medication? IRON DEXTRAN (EYE ern DEX tran) treats low levels of iron in your body. Iron is a mineral that plays an important role in making red blood cells, which carry oxygen from your lungs to the rest of your body. This medicine may be used for other purposes; ask your health care provider or pharmacist if you have questions. COMMON BRAND NAME(S): Dexferrum, INFeD What should I tell my care team before I take this medication? They need to know if you have any of these conditions: Anemia not caused by low iron levels Heart disease High levels of iron in the blood Kidney disease Liver disease An unusual or allergic reaction to iron, other medications, foods, dyes, or preservatives Pregnant or trying to get pregnant Breastfeeding How should I use this medication? This medication is for injection into a vein or a muscle. It is given in a  hospital or clinic setting. Talk to your care team about the use of this medication in children. While this medication may be prescribed for children as young as 104 months old for selected conditions, precautions do apply. Overdosage: If you think you have taken too much of this medicine contact a poison control center or emergency room at once. NOTE: This medicine is only for you. Do not share this medicine with others. What if I miss a dose? Keep appointments for follow-up doses. It is important not to miss your dose. Call your care team if you are unable to keep an appointment. What may interact with this medication? Do not take this medication with any of the following: Deferoxamine Dimercaprol Other iron products This medication may also interact with the following: Chloramphenicol Deferasirox This list may not describe all possible interactions. Give your health care provider a list of all the medicines, herbs, non-prescription drugs, or dietary supplements you use. Also tell them if you smoke, drink alcohol, or use illegal drugs. Some items may interact with your medicine. What should I watch for while using this medication? Visit your care team for regular checks on your progress. Tell your care team if your symptoms do not start to get better or if they get worse. You may need blood work while taking this medication. You may need to eat more foods that contain iron. Talk to your care team. Foods that contain iron include whole grains/cereals, dried fruits, beans, peas, leafy green vegetables,  and organ meats (liver, kidney). Long-term use of this medication may increase your risk of some cancers. Talk to your care team about your risk of cancer. What side effects may I notice from receiving this medication? Side effects that you should report to your care team as soon as possible: Allergic reactions--skin rash, itching, hives, swelling of the face, lips, tongue, or throat Low blood  pressure--dizziness, feeling faint or lightheaded, blurry vision Shortness of breath Side effects that usually do not require medical attention (report to your care team if they continue or are bothersome): Flushing Headache Joint pain Muscle pain Nausea Pain, redness, or irritation at injection site This list may not describe all possible side effects. Call your doctor for medical advice about side effects. You may report side effects to FDA at 1-800-FDA-1088. Where should I keep my medication? This medication is given in a hospital or clinic. It will not be stored at home. NOTE: This sheet is a summary. It may not cover all possible information. If you have questions about this medicine, talk to your doctor, pharmacist, or health care provider.  2024 Elsevier/Gold Standard (2021-10-11 00:00:00)    To help prevent nausea and vomiting after your treatment, we encourage you to take your nausea medication as directed.  BELOW ARE SYMPTOMS THAT SHOULD BE REPORTED IMMEDIATELY: *FEVER GREATER THAN 100.4 F (38 C) OR HIGHER *CHILLS OR SWEATING *NAUSEA AND VOMITING THAT IS NOT CONTROLLED WITH YOUR NAUSEA MEDICATION *UNUSUAL SHORTNESS OF BREATH *UNUSUAL BRUISING OR BLEEDING *URINARY PROBLEMS (pain or burning when urinating, or frequent urination) *BOWEL PROBLEMS (unusual diarrhea, constipation, pain near the anus) TENDERNESS IN MOUTH AND THROAT WITH OR WITHOUT PRESENCE OF ULCERS (sore throat, sores in mouth, or a toothache) UNUSUAL RASH, SWELLING OR PAIN  UNUSUAL VAGINAL DISCHARGE OR ITCHING   Items with * indicate a potential emergency and should be followed up as soon as possible or go to the Emergency Department if any problems should occur.  Please show the CHEMOTHERAPY ALERT CARD or IMMUNOTHERAPY ALERT CARD at check-in to the Emergency Department and triage nurse.  Should you have questions after your visit or need to cancel or reschedule your appointment, please contact [facility] [phone]  and follow the prompts.  Office hours are 8:00 a.m. to 4:30 p.m. Monday - Friday. Please note that voicemails left after 4:00 p.m. may not be returned until the following business day.  We are closed weekends and major holidays. You have access to a nurse at all times for urgent questions. Please call the main number to the clinic [phone] and follow the prompts.  For any non-urgent questions, you may also contact your provider using MyChart. We now offer e-Visits for anyone 59 and older to request care online for non-urgent symptoms. For details visit [email or web address].   Also download the MyChart app! Go to the app store, search "MyChart", open the app, select [facility], and log in with your MyChart username and password.

## 2022-09-13 NOTE — Progress Notes (Signed)
Patient tolerated iron infusion with no complaints voiced.  Peripheral IV site clean and dry with good blood return noted before and after infusion.  Band aid applied.  VSS with discharge and left in satisfactory condition with no s/s of distress noted.   

## 2022-09-19 ENCOUNTER — Inpatient Hospital Stay: Payer: 59 | Attending: Hematology

## 2022-09-19 DIAGNOSIS — D500 Iron deficiency anemia secondary to blood loss (chronic): Secondary | ICD-10-CM | POA: Diagnosis not present

## 2022-09-19 DIAGNOSIS — N920 Excessive and frequent menstruation with regular cycle: Secondary | ICD-10-CM | POA: Diagnosis not present

## 2022-09-19 DIAGNOSIS — D509 Iron deficiency anemia, unspecified: Secondary | ICD-10-CM

## 2022-09-19 LAB — CBC WITH DIFFERENTIAL/PLATELET
Abs Immature Granulocytes: 0.06 10*3/uL (ref 0.00–0.07)
Basophils Absolute: 0 10*3/uL (ref 0.0–0.1)
Basophils Relative: 1 %
Eosinophils Absolute: 0.1 10*3/uL (ref 0.0–0.5)
Eosinophils Relative: 2 %
HCT: 37.6 % (ref 36.0–46.0)
Hemoglobin: 12.1 g/dL (ref 12.0–15.0)
Immature Granulocytes: 1 %
Lymphocytes Relative: 18 %
Lymphs Abs: 1.2 10*3/uL (ref 0.7–4.0)
MCH: 27.9 pg (ref 26.0–34.0)
MCHC: 32.2 g/dL (ref 30.0–36.0)
MCV: 86.8 fL (ref 80.0–100.0)
Monocytes Absolute: 0.5 10*3/uL (ref 0.1–1.0)
Monocytes Relative: 8 %
Neutro Abs: 4.6 10*3/uL (ref 1.7–7.7)
Neutrophils Relative %: 70 %
Platelets: 270 10*3/uL (ref 150–400)
RBC: 4.33 MIL/uL (ref 3.87–5.11)
RDW: 18.4 % — ABNORMAL HIGH (ref 11.5–15.5)
WBC: 6.5 10*3/uL (ref 4.0–10.5)
nRBC: 0 % (ref 0.0–0.2)

## 2022-09-19 LAB — IRON AND TIBC
Iron: 96 ug/dL (ref 28–170)
Saturation Ratios: 25 % (ref 10.4–31.8)
TIBC: 392 ug/dL (ref 250–450)
UIBC: 296 ug/dL

## 2022-09-19 LAB — FERRITIN: Ferritin: 84 ng/mL (ref 11–307)

## 2022-09-25 ENCOUNTER — Inpatient Hospital Stay (HOSPITAL_BASED_OUTPATIENT_CLINIC_OR_DEPARTMENT_OTHER): Payer: 59 | Admitting: Oncology

## 2022-09-25 VITALS — BP 105/68 | HR 61 | Temp 98.8°F | Resp 17 | Ht 69.0 in | Wt 212.2 lb

## 2022-09-25 DIAGNOSIS — D509 Iron deficiency anemia, unspecified: Secondary | ICD-10-CM | POA: Diagnosis not present

## 2022-09-25 DIAGNOSIS — D500 Iron deficiency anemia secondary to blood loss (chronic): Secondary | ICD-10-CM | POA: Diagnosis not present

## 2022-09-25 NOTE — Progress Notes (Signed)
[facility] [address], [place] [address]   Clinic Day:  09/25/2022  Referring physician: Babs Sciara, MD  Patient Care Team: Babs Sciara, MD as PCP - General (Family Medicine) Doreatha Massed, MD as Medical Oncologist (Hematology)   ASSESSMENT & PLAN:   Assessment:  1.  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 - Labs on 06/18/2022: Ferritin 8, percent saturation 7, hemoglobin 11. - Positive for ice pica.  No prior history of transfusion or parenteral iron. - She cannot tolerate oral iron therapy. - She reports night sweats for the past 3 to 4 months, 3 times per week, drenching type.  2.  Social/family history: - Lives at home with her husband and children.  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  Non-smoker. - MGM: Breast cancer, PGM: Colon cancer, maternal first cousin died of TNBC at 36.  3.  Multiple sclerosis: - Usual symptoms: Numbness on the right side of the body, balance problems and vision issues. Ramiro Harvest every 6 months for the past 3 years.  Plan:  1.  Severe iron deficiency anemia from blood loss: -Labs from 09/19/22 showed a hemoglobin of 12.1, iron saturation 25% and ferritin 84.  This is a significant improvement from previous. -Received 4 doses of ferric gluconate on 08/20/2022, 08/30/2022, 09/06/2022 and 09/13/2022.  -Unable to tolerate IV Venofer.  This has been added to her allergy list.  Developed bilateral lower extremity numbness, burning and swelling with a syncopal episode following 200 mg of IV Venofer. -She is scheduled for total hysterectomy in October. -Recommend she return to clinic in 3 months for follow-up with labs CBC, ferritin and iron panel and see NP/MD to discuss further iron infusions.  PLAN SUMMARY: >> No additional iron today. >> RTC in 3 months for labs (CBC, Ferritin and iron panel)    Orders  Placed This Encounter  Procedures   CBC with Differential/Platelet    Standing Status:   Future    Standing Expiration Date:   09/25/2023   Ferritin    Standing Status:   Future    Standing Expiration Date:   09/25/2023   Iron and TIBC ([facility] ONLY)    Standing Status:   Future    Standing Expiration Date:   09/25/2023    Mauro Kaufmann, NP   6/11/202412:00 PM  CHIEF COMPLAINT/PURPOSE OF CONSULT:   Diagnosis: iron deficiency anemia  Current Therapy: Parenteral iron therapy  HISTORY OF PRESENT ILLNESS:   Nayeli is a 42 y.o. female presenting to clinic today for follow-up for iron deficiency anemia.  Received 4 doses of IV ferric gluconate between 08/20/2022-09/13/2022.  Was unable to tolerate IV Venofer.  Received 1 dose of IV Venofer on 08/07/22 and developed  lower extremity swelling, numbness and burning sensation.  Prior to leaving she had a syncopal episode without injury. This has now been added to her allergy list.  Since her IV iron infusion she has slowly started to feel better.  Appetite is still 100%.  Energy level has not significantly increased although possiblyslightly.  She is scheduled to have a total hysterectomy at the end of October.  She would like to be seen prior to this to make sure her hemoglobin is still okay for surgery.  She has history of MS and is followed by neurology at [facility].  Ramiro Harvest every 6 months and  will be due in October as well.   PAST MEDICAL HISTORY:   Past Medical History: Past Medical History:  Diagnosis Date   Anxiety    Headache(784.0)    Hypertension    Neuromuscular disorder (HCC)    sus   PONV (postoperative nausea and vomiting)    Thyroid nodule     Surgical History: Past Surgical History:  Procedure Laterality Date   ADENOIDECTOMY     CESAREAN SECTION     CESAREAN SECTION     THYROIDECTOMY  06/18/2011   Procedure: THYROIDECTOMY;  Surgeon: Christia Reading, MD;  Location: [facility] OR;   Service: ENT;  Laterality: Right;   TONSILLECTOMY     TYMPANOPLASTY      Social History: Social History   Socioeconomic History   Marital status: Married    Spouse name: Not on file   Number of children: Not on file   Years of education: Not on file   Highest education level: Not on file  Occupational History   Not on file  Tobacco Use   Smoking status: Former   Smokeless tobacco: Never  Vaping Use   Vaping Use: Never used  Substance and Sexual Activity   Alcohol use: Yes    Comment: 1/month   Drug use: No   Sexual activity: Yes    Birth control/protection: None  Other Topics Concern   Not on file  Social History Narrative   Not on file   Social Determinants of Health   Financial Resource Strain: Not on file  Food Insecurity: No Food Insecurity (08/03/2022)   Hunger Vital Sign    Worried About Running Out of Food in the Last Year: Never true    Ran Out of Food in the Last Year: Never true  Transportation Needs: No Transportation Needs (08/03/2022)   PRAPARE - Administrator, Civil Service (Medical): No    Lack of Transportation (Non-Medical): No  Physical Activity: Not on file  Stress: Not on file  Social Connections: Not on file  Intimate Partner Violence: Not At Risk (08/03/2022)   Humiliation, Afraid, Rape, and Kick questionnaire    Fear of Current or Ex-Partner: No    Emotionally Abused: No    Physically Abused: No    Sexually Abused: No    Family History: Family History  Problem Relation Age of Onset   Breast cancer Maternal Grandmother 56   Breast cancer Cousin 75       maternal first cousin    Current Medications:  Current Outpatient Medications:    amantadine (SYMMETREL) 100 MG capsule, Take 100 mg by mouth 2 (two) times daily., Disp: , Rfl:    baclofen (LIORESAL) 20 MG tablet, Take 20 mg by mouth at bedtime as needed for muscle spasms., Disp: , Rfl:    Cholecalciferol (VITAMIN D-3 PO), Take 1 capsule by mouth daily., Disp: , Rfl:     citalopram (CELEXA) 40 MG tablet, Take 1 tablet (40 mg total) by mouth daily., Disp: 90 tablet, Rfl: 2   Cyanocobalamin (VITAMIN B-12 PO), Take 1 tablet by mouth daily., Disp: , Rfl:    dalfampridine 10 MG TB12, Take by mouth., Disp: , Rfl:    ferric gluconate (FERRLECIT) 12.5 MG/ML injection, Inject by intravenous route., Disp: , Rfl:    ibuprofen (ADVIL) 200 MG tablet, Take 800 mg by mouth 2 (two) times daily as needed for headache or moderate pain., Disp: , Rfl:    levothyroxine (SYNTHROID) 100 MCG tablet, Take 1 tablet (100 mcg  total) by mouth daily before breakfast., Disp: 90 tablet, Rfl: 2   Ocrelizumab (OCREVUS IV), Inject 600 mg into the vein every 6 (six) months., Disp: , Rfl:    omeprazole (PRILOSEC) 20 MG capsule, Take 1 capsule (20 mg total) by mouth daily. Take one tablet daily, Disp: 21 capsule, Rfl: 0   propranolol ER (INDERAL LA) 80 MG 24 hr capsule, Take 1 capsule (80 mg total) by mouth daily., Disp: 90 capsule, Rfl: 1   rOPINIRole (REQUIP) 0.25 MG tablet, Take 0.5 mg by mouth at bedtime as needed (muscle spasms)., Disp: , Rfl:    sucralfate (CARAFATE) 1 g tablet, Take 1 tablet (1 g total) by mouth 4 (four) times daily -  with meals and at bedtime., Disp: 21 tablet, Rfl: 0   Allergies: Allergies  Allergen Reactions   Sulfa Antibiotics Anaphylaxis   Venofer [Iron Sucrose] Hives, Shortness Of Breath and Other (See Comments)    Also complained of knee, hand, and finger stiffness. Reaction occurred during post-infusion monitoring period. Required IV diphenhydramine. See progress note from 08/07/2022.   Darvocet [Propoxyphene N-Acetaminophen] Other (See Comments)    Hallucinations    Imitrex [Sumatriptan Base] Other (See Comments)    Lock-jaw   Keflex [Cephalexin] Rash    Childhood allergy   Penicillins Rash    Childhood allergy    REVIEW OF SYSTEMS:   Review of Systems  Constitutional:  Positive for fatigue. Negative for appetite change.  Endocrine: Positive for hot  flashes.  Musculoskeletal:  Positive for myalgias.     VITALS:   Blood pressure 105/68, pulse 61, temperature 98.8 F (37.1 C), temperature source Oral, resp. rate 17, height 5\' 9"  (1.753 m), weight 212 lb 3.2 oz (96.3 kg), SpO2 100 %.  Wt Readings from Last 3 Encounters:  09/25/22 212 lb 3.2 oz (96.3 kg)  09/06/22 210 lb 9.6 oz (95.5 kg)  08/12/22 210 lb 8.6 oz (95.5 kg)    Body mass index is 31.34 kg/m.   PHYSICAL EXAM:   Physical Exam Constitutional:      Appearance: Normal appearance.  Cardiovascular:     Rate and Rhythm: Normal rate and regular rhythm.  Pulmonary:     Effort: Pulmonary effort is normal.     Breath sounds: Normal breath sounds.  Abdominal:     General: Bowel sounds are normal.     Palpations: Abdomen is soft.  Musculoskeletal:        General: No swelling. Normal range of motion.  Neurological:     Mental Status: She is alert and oriented to person, place, and time. Mental status is at baseline.     LABS:      Latest Ref Rng & Units 09/19/2022    2:51 PM 08/12/2022   11:30 AM 06/18/2022   10:06 AM  CBC  WBC 4.0 - 10.5 K/uL 6.5  6.1  6.4   Hemoglobin 12.0 - 15.0 g/dL 16.1  09.6  04.5   Hematocrit 36.0 - 46.0 % 37.6  38.9  36.3   Platelets 150 - 400 K/uL 270  294  330       Latest Ref Rng & Units 08/12/2022   11:30 AM 06/18/2022   10:06 AM 09/22/2020    4:57 AM  CMP  Glucose 70 - 99 mg/dL 82  92  409   BUN 6 - 20 mg/dL 13  14  10    Creatinine 0.44 - 1.00 mg/dL 8.11  9.14  7.82   Sodium 135 - 145 mmol/L 136  138  133   Potassium 3.5 - 5.1 mmol/L 3.6  4.7  3.6   Chloride 98 - 111 mmol/L 103  101  103   CO2 22 - 32 mmol/L 26  22  25    Calcium 8.9 - 10.3 mg/dL 8.9  9.4  8.4   Total Protein 6.5 - 8.1 g/dL 7.5  7.0    Total Bilirubin 0.3 - 1.2 mg/dL 0.8  0.4    Alkaline Phos 38 - 126 U/L 51  64    AST 15 - 41 U/L 14  14    ALT 0 - 44 U/L 14  11       No results found for: "CEA1", "CEA" / No results found for: "CEA1", "CEA" No results found  for: "PSA1" No results found for: "ZOX096" No results found for: "CAN125"  No results found for: "TOTALPROTELP", "ALBUMINELP", "A1GS", "A2GS", "BETS", "BETA2SER", "GAMS", "MSPIKE", "SPEI" Lab Results  Component Value Date   TIBC 392 09/19/2022   TIBC 470 (H) 06/18/2022   FERRITIN 84 09/19/2022   FERRITIN 8 (L) 06/18/2022   IRONPCTSAT 25 09/19/2022   IRONPCTSAT 7 (LL) 06/18/2022   No results found for: "LDH"   STUDIES:   No results found.

## 2022-09-26 ENCOUNTER — Ambulatory Visit: Payer: 59 | Admitting: Oncology

## 2022-10-24 ENCOUNTER — Other Ambulatory Visit: Payer: Self-pay | Admitting: *Deleted

## 2022-10-24 DIAGNOSIS — D509 Iron deficiency anemia, unspecified: Secondary | ICD-10-CM

## 2022-10-24 NOTE — Progress Notes (Signed)
Patient called stating that she has been having muscle cramping and severe fatigue.  Per Durenda Hurt, will add on for labs tomorrow.  Patient aware.

## 2022-10-25 ENCOUNTER — Inpatient Hospital Stay: Payer: 59 | Attending: Hematology

## 2022-10-25 DIAGNOSIS — N920 Excessive and frequent menstruation with regular cycle: Secondary | ICD-10-CM | POA: Insufficient documentation

## 2022-10-25 DIAGNOSIS — D500 Iron deficiency anemia secondary to blood loss (chronic): Secondary | ICD-10-CM | POA: Insufficient documentation

## 2022-10-25 DIAGNOSIS — D259 Leiomyoma of uterus, unspecified: Secondary | ICD-10-CM | POA: Diagnosis not present

## 2022-10-25 DIAGNOSIS — D509 Iron deficiency anemia, unspecified: Secondary | ICD-10-CM

## 2022-10-25 LAB — CBC WITH DIFFERENTIAL/PLATELET
Abs Immature Granulocytes: 0.02 10*3/uL (ref 0.00–0.07)
Basophils Absolute: 0 10*3/uL (ref 0.0–0.1)
Basophils Relative: 1 %
Eosinophils Absolute: 0.1 10*3/uL (ref 0.0–0.5)
Eosinophils Relative: 2 %
HCT: 39.3 % (ref 36.0–46.0)
Hemoglobin: 12.9 g/dL (ref 12.0–15.0)
Immature Granulocytes: 0 %
Lymphocytes Relative: 21 %
Lymphs Abs: 1.1 10*3/uL (ref 0.7–4.0)
MCH: 29.7 pg (ref 26.0–34.0)
MCHC: 32.8 g/dL (ref 30.0–36.0)
MCV: 90.6 fL (ref 80.0–100.0)
Monocytes Absolute: 0.5 10*3/uL (ref 0.1–1.0)
Monocytes Relative: 10 %
Neutro Abs: 3.6 10*3/uL (ref 1.7–7.7)
Neutrophils Relative %: 66 %
Platelets: 259 10*3/uL (ref 150–400)
RBC: 4.34 MIL/uL (ref 3.87–5.11)
RDW: 14.9 % (ref 11.5–15.5)
WBC: 5.4 10*3/uL (ref 4.0–10.5)
nRBC: 0 % (ref 0.0–0.2)

## 2022-10-25 LAB — IRON AND TIBC
Iron: 72 ug/dL (ref 28–170)
Saturation Ratios: 17 % (ref 10.4–31.8)
TIBC: 417 ug/dL (ref 250–450)
UIBC: 345 ug/dL

## 2022-10-25 LAB — FERRITIN: Ferritin: 13 ng/mL (ref 11–307)

## 2022-10-26 ENCOUNTER — Encounter: Payer: Self-pay | Admitting: Oncology

## 2022-10-29 ENCOUNTER — Other Ambulatory Visit: Payer: Self-pay | Admitting: Physician Assistant

## 2022-10-29 NOTE — Telephone Encounter (Signed)
Please advie

## 2022-10-29 NOTE — Progress Notes (Signed)
Patient contacted triage nurse on 10/24/2022 to report severe fatigue and muscle cramping. Labs from 10/25/2022 showed normal hemoglobin 12.9, but worsening iron deficiency with ferritin 13 and iron saturation 17%. Recommend additional Ferrlecit 125 mg x 4 for treatment of symptomatic iron deficiency.  (Was unable to tolerate Ferrlecit 250 mg dose).  Each dose will be given 1 week apart. Premedications to include IV cetirizine, IV Solu-Medrol, and IV Pepcid due to history of hypersensitivity reaction to IV Venofer.  Carnella Guadalajara, PA-C 10/29/22 1:17 PM

## 2022-10-30 ENCOUNTER — Inpatient Hospital Stay: Payer: 59

## 2022-10-30 VITALS — BP 99/65 | HR 57 | Temp 98.2°F | Resp 18

## 2022-10-30 DIAGNOSIS — D500 Iron deficiency anemia secondary to blood loss (chronic): Secondary | ICD-10-CM | POA: Diagnosis not present

## 2022-10-30 DIAGNOSIS — D509 Iron deficiency anemia, unspecified: Secondary | ICD-10-CM

## 2022-10-30 MED ORDER — METHYLPREDNISOLONE SODIUM SUCC 125 MG IJ SOLR
125.0000 mg | Freq: Once | INTRAMUSCULAR | Status: AC
  Administered 2022-10-30: 125 mg via INTRAVENOUS
  Filled 2022-10-30: qty 2

## 2022-10-30 MED ORDER — CETIRIZINE HCL 10 MG/ML IV SOLN
10.0000 mg | Freq: Once | INTRAVENOUS | Status: AC
  Administered 2022-10-30: 10 mg via INTRAVENOUS
  Filled 2022-10-30: qty 1

## 2022-10-30 MED ORDER — SODIUM CHLORIDE 0.9 % IV SOLN
125.0000 mg | Freq: Once | INTRAVENOUS | Status: AC
  Administered 2022-10-30: 125 mg via INTRAVENOUS
  Filled 2022-10-30: qty 10

## 2022-10-30 MED ORDER — SODIUM CHLORIDE 0.9 % IV SOLN: Freq: Once | INTRAVENOUS | Status: AC

## 2022-10-30 MED ORDER — FAMOTIDINE IN NACL 20-0.9 MG/50ML-% IV SOLN
20.0000 mg | Freq: Once | INTRAVENOUS | Status: AC
  Administered 2022-10-30: 20 mg via INTRAVENOUS
  Filled 2022-10-30: qty 50

## 2022-10-30 MED ORDER — ACETAMINOPHEN 325 MG PO TABS
650.0000 mg | ORAL_TABLET | Freq: Once | ORAL | Status: AC
  Administered 2022-10-30: 650 mg via ORAL
  Filled 2022-10-30: qty 2

## 2022-10-30 NOTE — Progress Notes (Signed)
Patient presents today for iron infusion.  Patient is in satisfactory condition.  She c/o muscle/body aches for about 2 weeks.  She also has a MS diagnosis and it's undetermined if this is iron or MS related. Rojelio Brenner, PA-C made aware.  Vital signs are stable.  IV placed in right AC.  IV flushed well with good blood return noted.  We will proceed with infusion per provider orders.    Patient tolerated infusion well with no complaints voiced.  Patient left ambulatory in stable condition.  Vital signs stable at discharge.  Follow up as scheduled.

## 2022-10-30 NOTE — Patient Instructions (Signed)
[facility]  Discharge Instructions: Thank you for choosing [facility] to provide your oncology and hematology care.  If you have a lab appointment with the Cancer Center - please note that after April 8th, 2024, all labs will be drawn in the cancer center.  You do not have to check in or register with the main entrance as you have in the past but will complete your check-in in the cancer center.  Wear comfortable clothing and clothing appropriate for easy access to any Portacath or PICC line.   We strive to give you quality time with your provider. You may need to reschedule your appointment if you arrive late (15 or more minutes).  Arriving late affects you and other patients whose appointments are after yours.  Also, if you miss three or more appointments without notifying the office, you may be dismissed from the clinic at the provider's discretion.      For prescription refill requests, have your pharmacy contact our office and allow 72 hours for refills to be completed.    Today you received the following:  Ferrlecit.  Sodium Ferric Gluconate Complex Injection What is this medication? SODIUM FERRIC GLUCONATE COMPLEX (SOE dee um FER ik GLOO koe nate KOM pleks) treats low levels of iron (iron deficiency anemia) in people with kidney disease. Iron is a mineral that plays an important role in making red blood cells, which carry oxygen from your lungs to the rest of your body. This medicine may be used for other purposes; ask your health care provider or pharmacist if you have questions. COMMON BRAND NAME(S): Ferrlecit, Nulecit What should I tell my care team before I take this medication? They need to know if you have any of the following conditions: Anemia that is not from iron deficiency High levels of iron in the blood An unusual or allergic reaction to iron, other medications, foods, dyes, or preservatives Pregnant or are trying to become  pregnant Breast-feeding How should I use this medication? This medication is injected into a vein. It is given by your care team in a hospital or clinic setting. Talk to your care team about the use of this medication in children. While it may be prescribed for children as young as 6 years for selected conditions, precautions do apply. Overdosage: If you think you have taken too much of this medicine contact a poison control center or emergency room at once. NOTE: This medicine is only for you. Do not share this medicine with others. What if I miss a dose? It is important not to miss your dose. Call your care team if you are unable to keep an appointment. What may interact with this medication? Do not take this medication with any of the following: Deferasirox Deferoxamine Dimercaprol This medication may also interact with the following: Other iron products This list may not describe all possible interactions. Give your health care provider a list of all the medicines, herbs, non-prescription drugs, or dietary supplements you use. Also tell them if you smoke, drink alcohol, or use illegal drugs. Some items may interact with your medicine. What should I watch for while using this medication? Your condition will be monitored carefully while you are receiving this medication. Visit your care team for regular checks on your progress. You may need blood work while you are taking this medication. What side effects may I notice from receiving this medication? Side effects that you should report to your care team as soon  as possible: Allergic reactions--skin rash, itching, hives, swelling of the face, lips, tongue, or throat Low blood pressure--dizziness, feeling faint or lightheaded, blurry vision Shortness of breath Side effects that usually do not require medical attention (report to your care team if they continue or are bothersome): Flushing Headache Joint pain Muscle pain Nausea Pain,  redness, or irritation at injection site This list may not describe all possible side effects. Call your doctor for medical advice about side effects. You may report side effects to FDA at 1-800-FDA-1088. Where should I keep my medication? This medication is given in a hospital or clinic and will not be stored at home. NOTE: This sheet is a summary. It may not cover all possible information. If you have questions about this medicine, talk to your doctor, pharmacist, or health care provider.  2024 Elsevier/Gold Standard (2020-08-26 00:00:00)     To help prevent nausea and vomiting after your treatment, we encourage you to take your nausea medication as directed.  BELOW ARE SYMPTOMS THAT SHOULD BE REPORTED IMMEDIATELY: *FEVER GREATER THAN 100.4 F (38 C) OR HIGHER *CHILLS OR SWEATING *NAUSEA AND VOMITING THAT IS NOT CONTROLLED WITH YOUR NAUSEA MEDICATION *UNUSUAL SHORTNESS OF BREATH *UNUSUAL BRUISING OR BLEEDING *URINARY PROBLEMS (pain or burning when urinating, or frequent urination) *BOWEL PROBLEMS (unusual diarrhea, constipation, pain near the anus) TENDERNESS IN MOUTH AND THROAT WITH OR WITHOUT PRESENCE OF ULCERS (sore throat, sores in mouth, or a toothache) UNUSUAL RASH, SWELLING OR PAIN  UNUSUAL VAGINAL DISCHARGE OR ITCHING   Items with * indicate a potential emergency and should be followed up as soon as possible or go to the Emergency Department if any problems should occur.  Please show the CHEMOTHERAPY ALERT CARD or IMMUNOTHERAPY ALERT CARD at check-in to the Emergency Department and triage nurse.  Should you have questions after your visit or need to cancel or reschedule your appointment, please contact [facility] [phone]  and follow the prompts.  Office hours are 8:00 a.m. to 4:30 p.m. Monday - Friday. Please note that voicemails left after 4:00 p.m. may not be returned until the following business day.  We are closed weekends and major holidays. You  have access to a nurse at all times for urgent questions. Please call the main number to the clinic [phone] and follow the prompts.  For any non-urgent questions, you may also contact your provider using MyChart. We now offer e-Visits for anyone 63 and older to request care online for non-urgent symptoms. For details visit [email or web address].   Also download the MyChart app! Go to the app store, search "MyChart", open the app, select [facility], and log in with your MyChart username and password.

## 2022-11-01 ENCOUNTER — Other Ambulatory Visit: Payer: Self-pay

## 2022-11-01 ENCOUNTER — Telehealth: Payer: Self-pay

## 2022-11-01 MED ORDER — PROPRANOLOL HCL ER 80 MG PO CP24: 80.0000 mg | ORAL_CAPSULE | Freq: Every day | ORAL | 1 refills | Status: DC

## 2022-11-01 NOTE — Telephone Encounter (Signed)
Prescription Request  11/01/2022  LOV: Visit date not found  What is the name of the medication or equipment? propranolol ER (INDERAL LA) 80 MG 24 hr capsule  She has called the pharmacy and they asked [place] to send new RX  Have you contacted your pharmacy to request a refill? Yes   Which pharmacy would you like this sent to?  (Inactive see NCPDP [id number]) [facility] [place], [place] - [address] [place] [address] Phone: [phone] Fax: [phone]  [facility] - [place], [place] - [address] [place] [address] Phone: [phone] Fax: [phone]    Patient notified that their request is being sent to the clinical staff for review and that they should receive a response within 2 business days.   Please advise at Mobile [phone] (mobile)

## 2022-11-06 ENCOUNTER — Inpatient Hospital Stay: Payer: 59

## 2022-11-06 ENCOUNTER — Encounter: Payer: Self-pay | Admitting: Physician Assistant

## 2022-11-06 VITALS — BP 106/73 | HR 70 | Temp 98.3°F | Resp 16

## 2022-11-06 DIAGNOSIS — D500 Iron deficiency anemia secondary to blood loss (chronic): Secondary | ICD-10-CM | POA: Diagnosis not present

## 2022-11-06 DIAGNOSIS — D509 Iron deficiency anemia, unspecified: Secondary | ICD-10-CM

## 2022-11-06 MED ORDER — FAMOTIDINE IN NACL 20-0.9 MG/50ML-% IV SOLN
20.0000 mg | Freq: Once | INTRAVENOUS | Status: AC
  Administered 2022-11-06: 20 mg via INTRAVENOUS
  Filled 2022-11-06: qty 50

## 2022-11-06 MED ORDER — CETIRIZINE HCL 10 MG/ML IV SOLN
10.0000 mg | Freq: Once | INTRAVENOUS | Status: AC
  Administered 2022-11-06: 10 mg via INTRAVENOUS
  Filled 2022-11-06: qty 1

## 2022-11-06 MED ORDER — SODIUM CHLORIDE 0.9 % IV SOLN: Freq: Once | INTRAVENOUS | Status: AC

## 2022-11-06 MED ORDER — METHYLPREDNISOLONE SODIUM SUCC 125 MG IJ SOLR
125.0000 mg | Freq: Once | INTRAMUSCULAR | Status: AC
  Administered 2022-11-06: 125 mg via INTRAVENOUS
  Filled 2022-11-06: qty 2

## 2022-11-06 MED ORDER — ACETAMINOPHEN 325 MG PO TABS
650.0000 mg | ORAL_TABLET | Freq: Once | ORAL | Status: AC
  Administered 2022-11-06: 650 mg via ORAL
  Filled 2022-11-06: qty 2

## 2022-11-06 MED ORDER — SODIUM CHLORIDE 0.9 % IV SOLN
125.0000 mg | Freq: Once | INTRAVENOUS | Status: AC
  Administered 2022-11-06: 125 mg via INTRAVENOUS
  Filled 2022-11-06: qty 10

## 2022-11-06 NOTE — Progress Notes (Signed)
Patient presents today for Ferrlecit infusion.  Patient is in satisfactory condition with no new complaints voiced.  Vital signs are stable. Patient is requesting to talk to Jaclynn Guarneri PA about generalized joint pain/body aches and hot flashes that she has been having since before treatment and is progressively getting worse. Per patient unable to see her neurologist that specializes in MS because they have all left the practice and unsure who to talk to now.  Rojelio Brenner PA sent a message and made aware of patient's concern, will either see patient today if possible or call patient later today. Patient made aware and verbalized understanding.  We will proceed with infusion per provider orders.    Patient tolerated treatment well with no complaints voiced.  Patient left ambulatory in stable condition.  Vital signs stable at discharge.  Follow up as scheduled.

## 2022-11-06 NOTE — Patient Instructions (Signed)
[facility]  Discharge Instructions: Thank you for choosing [facility] to provide your oncology and hematology care.  If you have a lab appointment with the Cancer Center - please note that after April 8th, 2024, all labs will be drawn in the cancer center.  You do not have to check in or register with the main entrance as you have in the past but will complete your check-in in the cancer center.  Wear comfortable clothing and clothing appropriate for easy access to any Portacath or PICC line.   We strive to give you quality time with your provider. You may need to reschedule your appointment if you arrive late (15 or more minutes).  Arriving late affects you and other patients whose appointments are after yours.  Also, if you miss three or more appointments without notifying the office, you may be dismissed from the clinic at the provider's discretion.      For prescription refill requests, have your pharmacy contact our office and allow 72 hours for refills to be completed.    Today you received the following:  Ferrlecit.  Sodium Ferric Gluconate Complex Injection What is this medication? SODIUM FERRIC GLUCONATE COMPLEX (SOE dee um FER ik GLOO koe nate KOM pleks) treats low levels of iron (iron deficiency anemia) in people with kidney disease. Iron is a mineral that plays an important role in making red blood cells, which carry oxygen from your lungs to the rest of your body. This medicine may be used for other purposes; ask your health care provider or pharmacist if you have questions. COMMON BRAND NAME(S): Ferrlecit, Nulecit What should I tell my care team before I take this medication? They need to know if you have any of the following conditions: Anemia that is not from iron deficiency High levels of iron in the blood An unusual or allergic reaction to iron, other medications, foods, dyes, or preservatives Pregnant or are trying to become  pregnant Breast-feeding How should I use this medication? This medication is injected into a vein. It is given by your care team in a hospital or clinic setting. Talk to your care team about the use of this medication in children. While it may be prescribed for children as young as 6 years for selected conditions, precautions do apply. Overdosage: If you think you have taken too much of this medicine contact a poison control center or emergency room at once. NOTE: This medicine is only for you. Do not share this medicine with others. What if I miss a dose? It is important not to miss your dose. Call your care team if you are unable to keep an appointment. What may interact with this medication? Do not take this medication with any of the following: Deferasirox Deferoxamine Dimercaprol This medication may also interact with the following: Other iron products This list may not describe all possible interactions. Give your health care provider a list of all the medicines, herbs, non-prescription drugs, or dietary supplements you use. Also tell them if you smoke, drink alcohol, or use illegal drugs. Some items may interact with your medicine. What should I watch for while using this medication? Your condition will be monitored carefully while you are receiving this medication. Visit your care team for regular checks on your progress. You may need blood work while you are taking this medication. What side effects may I notice from receiving this medication? Side effects that you should report to your care team as soon  as possible: Allergic reactions--skin rash, itching, hives, swelling of the face, lips, tongue, or throat Low blood pressure--dizziness, feeling faint or lightheaded, blurry vision Shortness of breath Side effects that usually do not require medical attention (report to your care team if they continue or are bothersome): Flushing Headache Joint pain Muscle pain Nausea Pain,  redness, or irritation at injection site This list may not describe all possible side effects. Call your doctor for medical advice about side effects. You may report side effects to FDA at 1-800-FDA-1088. Where should I keep my medication? This medication is given in a hospital or clinic and will not be stored at home. NOTE: This sheet is a summary. It may not cover all possible information. If you have questions about this medicine, talk to your doctor, pharmacist, or health care provider.  2024 Elsevier/Gold Standard (2020-08-26 00:00:00)     To help prevent nausea and vomiting after your treatment, we encourage you to take your nausea medication as directed.  BELOW ARE SYMPTOMS THAT SHOULD BE REPORTED IMMEDIATELY: *FEVER GREATER THAN 100.4 F (38 C) OR HIGHER *CHILLS OR SWEATING *NAUSEA AND VOMITING THAT IS NOT CONTROLLED WITH YOUR NAUSEA MEDICATION *UNUSUAL SHORTNESS OF BREATH *UNUSUAL BRUISING OR BLEEDING *URINARY PROBLEMS (pain or burning when urinating, or frequent urination) *BOWEL PROBLEMS (unusual diarrhea, constipation, pain near the anus) TENDERNESS IN MOUTH AND THROAT WITH OR WITHOUT PRESENCE OF ULCERS (sore throat, sores in mouth, or a toothache) UNUSUAL RASH, SWELLING OR PAIN  UNUSUAL VAGINAL DISCHARGE OR ITCHING   Items with * indicate a potential emergency and should be followed up as soon as possible or go to the Emergency Department if any problems should occur.  Please show the CHEMOTHERAPY ALERT CARD or IMMUNOTHERAPY ALERT CARD at check-in to the Emergency Department and triage nurse.  Should you have questions after your visit or need to cancel or reschedule your appointment, please contact [facility] [phone]  and follow the prompts.  Office hours are 8:00 a.m. to 4:30 p.m. Monday - Friday. Please note that voicemails left after 4:00 p.m. may not be returned until the following business day.  We are closed weekends and major holidays. You  have access to a nurse at all times for urgent questions. Please call the main number to the clinic [phone] and follow the prompts.  For any non-urgent questions, you may also contact your provider using MyChart. We now offer e-Visits for anyone 63 and older to request care online for non-urgent symptoms. For details visit [email or web address].   Also download the MyChart app! Go to the app store, search "MyChart", open the app, select [facility], and log in with your MyChart username and password.

## 2022-11-07 ENCOUNTER — Encounter: Payer: Self-pay | Admitting: Physician Assistant

## 2022-11-07 ENCOUNTER — Telehealth: Payer: Self-pay | Admitting: Physician Assistant

## 2022-11-07 ENCOUNTER — Encounter: Payer: Self-pay | Admitting: Hematology

## 2022-11-07 DIAGNOSIS — G35 Multiple sclerosis: Secondary | ICD-10-CM

## 2022-11-07 NOTE — Telephone Encounter (Signed)
Per patient request, I spoke with Ms. Lisa [name] via telephone today to discuss ongoing constellation of symptoms that she is experiencing.  MYALGIAS: She continues to have aching pain in her "bones and muscles," particularly posterior upper arm and upper thighs, as well as aching pain in her throat just below her jaw.  Pain did not occur until she started receiving IV iron infusions.  Symptoms will worsen after each iron infusion, and start to improve after 3 to 4 days.  However, since she is receiving iron infusions on a nearly weekly basis, she has not had much chance for symptoms to completely resolve except for in the 6-week break that she had between her May and the July iron infusions. Suspect arthralgia and myalgia side effects secondary to IV iron. No symptoms of recurrent hypersensitivity/allergic reaction. Continue Tylenol/NSAIDs for treatment of symptoms. She is scheduled for hysterectomy in October, so hopefully this will decrease her need for IV iron to allow her more recovery between the iron infusions. We will change her remaining infusions to every other week to see if this decreases her symptom burden.  NIGHT SWEATS: Drenching night sweats with hot flashes and flushing were present prior to starting iron treatment, and do not have any temporal association with IV iron.  No other B symptoms such as fevers or unintentional weight loss.  She has not noticed any masses or lymphadenopathy. Unclear cause of night sweats.  Question if this could be related to her MS? Otherwise, suspect hormonal or endocrine etiology and recommend additional workup via PCP.  MULTIPLE SCLEROSIS: Patient was following with neurology at [facility], but reports that her entire neurology team left the practice after it was brought out by a larger group.  Therefore, she has been left without follow-up plan for treatment of her MS and requests to establish care with [facility].  Referral sent to  [facility] MS specialist (Dr. Despina Arias) at [facility].  Will route this encounter to NP Durenda Hurt, who has been following patient at hematology clinic / Cancer Center.  Carnella Guadalajara, PA-C  11/07/22 12:07 PM

## 2022-11-13 ENCOUNTER — Inpatient Hospital Stay: Payer: 59

## 2022-11-14 ENCOUNTER — Ambulatory Visit: Payer: 59 | Admitting: Nurse Practitioner

## 2022-11-20 ENCOUNTER — Inpatient Hospital Stay: Payer: 59

## 2022-11-22 ENCOUNTER — Inpatient Hospital Stay: Payer: 59 | Attending: Oncology

## 2022-11-22 VITALS — BP 105/68 | HR 58 | Temp 96.5°F | Resp 18

## 2022-11-22 DIAGNOSIS — N920 Excessive and frequent menstruation with regular cycle: Secondary | ICD-10-CM | POA: Diagnosis not present

## 2022-11-22 DIAGNOSIS — D500 Iron deficiency anemia secondary to blood loss (chronic): Secondary | ICD-10-CM | POA: Diagnosis not present

## 2022-11-22 DIAGNOSIS — D509 Iron deficiency anemia, unspecified: Secondary | ICD-10-CM

## 2022-11-22 MED ORDER — SODIUM CHLORIDE 0.9 % IV SOLN: Freq: Once | INTRAVENOUS | Status: AC

## 2022-11-22 MED ORDER — METHYLPREDNISOLONE SODIUM SUCC 125 MG IJ SOLR
125.0000 mg | Freq: Once | INTRAMUSCULAR | Status: AC
  Administered 2022-11-22: 125 mg via INTRAVENOUS
  Filled 2022-11-22: qty 2

## 2022-11-22 MED ORDER — CETIRIZINE HCL 10 MG/ML IV SOLN
10.0000 mg | Freq: Once | INTRAVENOUS | Status: AC
  Administered 2022-11-22: 10 mg via INTRAVENOUS
  Filled 2022-11-22: qty 1

## 2022-11-22 MED ORDER — SODIUM CHLORIDE 0.9 % IV SOLN
125.0000 mg | Freq: Once | INTRAVENOUS | Status: AC
  Administered 2022-11-22: 125 mg via INTRAVENOUS
  Filled 2022-11-22: qty 10

## 2022-11-22 MED ORDER — ACETAMINOPHEN 325 MG PO TABS
650.0000 mg | ORAL_TABLET | Freq: Once | ORAL | Status: AC
  Administered 2022-11-22: 650 mg via ORAL
  Filled 2022-11-22: qty 2

## 2022-11-22 MED ORDER — FAMOTIDINE IN NACL 20-0.9 MG/50ML-% IV SOLN
20.0000 mg | Freq: Once | INTRAVENOUS | Status: AC
  Administered 2022-11-22: 20 mg via INTRAVENOUS
  Filled 2022-11-22: qty 50

## 2022-11-22 NOTE — Progress Notes (Signed)
Patient tolerated iron infusion with no complaints voiced.  Peripheral IV site clean and dry with good blood return noted before and after infusion.  Band aid applied.  VSS with discharge and left in satisfactory condition with no s/s of distress noted.   

## 2022-11-22 NOTE — Patient Instructions (Signed)

## 2022-12-06 ENCOUNTER — Inpatient Hospital Stay: Payer: 59

## 2022-12-06 VITALS — BP 104/75 | HR 57 | Temp 97.9°F | Resp 18

## 2022-12-06 DIAGNOSIS — D500 Iron deficiency anemia secondary to blood loss (chronic): Secondary | ICD-10-CM | POA: Diagnosis not present

## 2022-12-06 DIAGNOSIS — D509 Iron deficiency anemia, unspecified: Secondary | ICD-10-CM

## 2022-12-06 MED ORDER — SODIUM CHLORIDE 0.9 % IV SOLN
125.0000 mg | Freq: Once | INTRAVENOUS | Status: AC
  Administered 2022-12-06: 125 mg via INTRAVENOUS
  Filled 2022-12-06: qty 10

## 2022-12-06 MED ORDER — FAMOTIDINE IN NACL 20-0.9 MG/50ML-% IV SOLN
20.0000 mg | Freq: Once | INTRAVENOUS | Status: AC
  Administered 2022-12-06: 20 mg via INTRAVENOUS
  Filled 2022-12-06: qty 50

## 2022-12-06 MED ORDER — METHYLPREDNISOLONE SODIUM SUCC 125 MG IJ SOLR
125.0000 mg | Freq: Once | INTRAMUSCULAR | Status: AC
  Administered 2022-12-06: 125 mg via INTRAVENOUS
  Filled 2022-12-06: qty 2

## 2022-12-06 MED ORDER — CETIRIZINE HCL 10 MG/ML IV SOLN
10.0000 mg | Freq: Once | INTRAVENOUS | Status: AC
  Administered 2022-12-06: 10 mg via INTRAVENOUS
  Filled 2022-12-06: qty 1

## 2022-12-06 MED ORDER — ACETAMINOPHEN 325 MG PO TABS
650.0000 mg | ORAL_TABLET | Freq: Once | ORAL | Status: AC
  Administered 2022-12-06: 650 mg via ORAL
  Filled 2022-12-06: qty 2

## 2022-12-06 MED ORDER — SODIUM CHLORIDE 0.9 % IV SOLN: Freq: Once | INTRAVENOUS | Status: AC

## 2022-12-06 NOTE — Patient Instructions (Addendum)
[facility]  Discharge Instructions: Thank you for choosing [facility] to provide your oncology and hematology care.  If you have a lab appointment with the Cancer Center - please note that after April 8th, 2024, all labs will be drawn in the cancer center.  You do not have to check in or register with the main entrance as you have in the past but will complete your check-in in the cancer center.  Wear comfortable clothing and clothing appropriate for easy access to any Portacath or PICC line.   We strive to give you quality time with your provider. You may need to reschedule your appointment if you arrive late (15 or more minutes).  Arriving late affects you and other patients whose appointments are after yours.  Also, if you miss three or more appointments without notifying the office, you may be dismissed from the clinic at the provider's discretion.      For prescription refill requests, have your pharmacy contact our office and allow 72 hours for refills to be completed.    Today you received the following:  Ferrlecit.  Sodium Ferric Gluconate Complex Injection What is this medication? SODIUM FERRIC GLUCONATE COMPLEX (SOE dee um FER ik GLOO koe nate KOM pleks) treats low levels of iron (iron deficiency anemia) in people with kidney disease. Iron is a mineral that plays an important role in making red blood cells, which carry oxygen from your lungs to the rest of your body. This medicine may be used for other purposes; ask your health care provider or pharmacist if you have questions. COMMON BRAND NAME(S): Ferrlecit, Nulecit What should I tell my care team before I take this medication? They need to know if you have any of the following conditions: Anemia that is not from iron deficiency High levels of iron in the blood An unusual or allergic reaction to iron, other medications, foods, dyes, or preservatives Pregnant or are trying to become  pregnant Breast-feeding How should I use this medication? This medication is injected into a vein. It is given by your care team in a hospital or clinic setting. Talk to your care team about the use of this medication in children. While it may be prescribed for children as young as 6 years for selected conditions, precautions do apply. Overdosage: If you think you have taken too much of this medicine contact a poison control center or emergency room at once. NOTE: This medicine is only for you. Do not share this medicine with others. What if I miss a dose? It is important not to miss your dose. Call your care team if you are unable to keep an appointment. What may interact with this medication? Do not take this medication with any of the following: Deferasirox Deferoxamine Dimercaprol This medication may also interact with the following: Other iron products This list may not describe all possible interactions. Give your health care provider a list of all the medicines, herbs, non-prescription drugs, or dietary supplements you use. Also tell them if you smoke, drink alcohol, or use illegal drugs. Some items may interact with your medicine. What should I watch for while using this medication? Your condition will be monitored carefully while you are receiving this medication. Visit your care team for regular checks on your progress. You may need blood work while you are taking this medication. What side effects may I notice from receiving this medication? Side effects that you should report to your care team as soon  as possible: Allergic reactions--skin rash, itching, hives, swelling of the face, lips, tongue, or throat Low blood pressure--dizziness, feeling faint or lightheaded, blurry vision Shortness of breath Side effects that usually do not require medical attention (report to your care team if they continue or are bothersome): Flushing Headache Joint pain Muscle pain Nausea Pain,  redness, or irritation at injection site This list may not describe all possible side effects. Call your doctor for medical advice about side effects. You may report side effects to FDA at 1-800-FDA-1088. Where should I keep my medication? This medication is given in a hospital or clinic and will not be stored at home. NOTE: This sheet is a summary. It may not cover all possible information. If you have questions about this medicine, talk to your doctor, pharmacist, or health care provider.  2024 Elsevier/Gold Standard (2020-08-26 00:00:00)     To help prevent nausea and vomiting after your treatment, we encourage you to take your nausea medication as directed.  BELOW ARE SYMPTOMS THAT SHOULD BE REPORTED IMMEDIATELY: *FEVER GREATER THAN 100.4 F (38 C) OR HIGHER *CHILLS OR SWEATING *NAUSEA AND VOMITING THAT IS NOT CONTROLLED WITH YOUR NAUSEA MEDICATION *UNUSUAL SHORTNESS OF BREATH *UNUSUAL BRUISING OR BLEEDING *URINARY PROBLEMS (pain or burning when urinating, or frequent urination) *BOWEL PROBLEMS (unusual diarrhea, constipation, pain near the anus) TENDERNESS IN MOUTH AND THROAT WITH OR WITHOUT PRESENCE OF ULCERS (sore throat, sores in mouth, or a toothache) UNUSUAL RASH, SWELLING OR PAIN  UNUSUAL VAGINAL DISCHARGE OR ITCHING   Items with * indicate a potential emergency and should be followed up as soon as possible or go to the Emergency Department if any problems should occur.  Please show the CHEMOTHERAPY ALERT CARD or IMMUNOTHERAPY ALERT CARD at check-in to the Emergency Department and triage nurse.  Should you have questions after your visit or need to cancel or reschedule your appointment, please contact [facility] [phone]  and follow the prompts.  Office hours are 8:00 a.m. to 4:30 p.m. Monday - Friday. Please note that voicemails left after 4:00 p.m. may not be returned until the following business day.  We are closed weekends and major holidays. You  have access to a nurse at all times for urgent questions. Please call the main number to the clinic [phone] and follow the prompts.  For any non-urgent questions, you may also contact your provider using MyChart. We now offer e-Visits for anyone 63 and older to request care online for non-urgent symptoms. For details visit [email or web address].   Also download the MyChart app! Go to the app store, search "MyChart", open the app, select [facility], and log in with your MyChart username and password.

## 2022-12-06 NOTE — Progress Notes (Signed)
 Patient presents today for iron infusion.  Patient is in satisfactory condition with no new complaints voiced.  Vital signs are stable.  IV placed in left AC. IV flushed well with good blood return noted.  We will proceed with infusion per provider orders.    Patient tolerated iron infusion well with no complaints voiced.  Patient left ambulatory in stable condition.  Vital signs stable at discharge.  Follow up as scheduled.

## 2022-12-20 ENCOUNTER — Inpatient Hospital Stay: Payer: 59

## 2022-12-27 ENCOUNTER — Inpatient Hospital Stay: Payer: 59 | Admitting: Oncology

## 2023-01-10 ENCOUNTER — Inpatient Hospital Stay: Payer: 59 | Attending: Oncology

## 2023-01-10 ENCOUNTER — Other Ambulatory Visit: Payer: Self-pay

## 2023-01-10 ENCOUNTER — Encounter (HOSPITAL_BASED_OUTPATIENT_CLINIC_OR_DEPARTMENT_OTHER): Payer: Self-pay | Admitting: Obstetrics and Gynecology

## 2023-01-10 DIAGNOSIS — D259 Leiomyoma of uterus, unspecified: Secondary | ICD-10-CM | POA: Diagnosis present

## 2023-01-10 DIAGNOSIS — Z01818 Encounter for other preprocedural examination: Secondary | ICD-10-CM | POA: Diagnosis present

## 2023-01-10 DIAGNOSIS — D509 Iron deficiency anemia, unspecified: Secondary | ICD-10-CM

## 2023-01-10 DIAGNOSIS — Z01812 Encounter for preprocedural laboratory examination: Secondary | ICD-10-CM | POA: Diagnosis not present

## 2023-01-10 DIAGNOSIS — D500 Iron deficiency anemia secondary to blood loss (chronic): Secondary | ICD-10-CM | POA: Insufficient documentation

## 2023-01-10 LAB — CBC WITH DIFFERENTIAL/PLATELET
Abs Immature Granulocytes: 0.02 10*3/uL (ref 0.00–0.07)
Basophils Absolute: 0 10*3/uL (ref 0.0–0.1)
Basophils Relative: 1 %
Eosinophils Absolute: 0.1 10*3/uL (ref 0.0–0.5)
Eosinophils Relative: 2 %
HCT: 39.5 % (ref 36.0–46.0)
Hemoglobin: 13.1 g/dL (ref 12.0–15.0)
Immature Granulocytes: 0 %
Lymphocytes Relative: 23 %
Lymphs Abs: 1.1 10*3/uL (ref 0.7–4.0)
MCH: 30.8 pg (ref 26.0–34.0)
MCHC: 33.2 g/dL (ref 30.0–36.0)
MCV: 92.9 fL (ref 80.0–100.0)
Monocytes Absolute: 0.4 10*3/uL (ref 0.1–1.0)
Monocytes Relative: 9 %
Neutro Abs: 3.3 10*3/uL (ref 1.7–7.7)
Neutrophils Relative %: 65 %
Platelets: 224 10*3/uL (ref 150–400)
RBC: 4.25 MIL/uL (ref 3.87–5.11)
RDW: 13.2 % (ref 11.5–15.5)
WBC: 4.9 10*3/uL (ref 4.0–10.5)
nRBC: 0 % (ref 0.0–0.2)

## 2023-01-10 LAB — IRON AND TIBC
Iron: 85 ug/dL (ref 28–170)
Saturation Ratios: 21 % (ref 10.4–31.8)
TIBC: 404 ug/dL (ref 250–450)
UIBC: 319 ug/dL

## 2023-01-10 LAB — FERRITIN: Ferritin: 23 ng/mL (ref 11–307)

## 2023-01-10 NOTE — Progress Notes (Addendum)
Spoke w/ via phone for pre-op interview---Jerrilynn Lab needs dos----UPT per anesthesia, surgeon orders pending         Lab results------01/15/23 lab appt for cbc, bmp, type & screen,  06/18/22 chest xray in Epic COVID test -----patient states asymptomatic no test needed Arrive at -------0530 on Tuesday, 01/22/2023 NPO after MN NO Solid Food.  Clear liquids from MN until---0430 Med rec completed Medications to take morning of surgery -----Celexa, Synthroid Diabetic medication -----n/a Patient instructed no nail polish to be worn day of surgery Patient instructed to bring photo id and insurance card day of surgery Patient aware to have Driver (ride ) / caregiver    for 24 hours after surgery - husband, Clifton Custard Patient Special Instructions -----Extended / overnight stay instructions given. Pre-Op special Instructions -----Requested orders from Dr. Henderson Cloud via Epic IB on 01/10/23. Patient verbalized understanding of instructions that were given at this phone interview. Patient denies chest pain, sob, fever, cough at the interview.   Patient has a hx of multiple sclerosis. She was following with [facility] neurology. Patient switched to [facility], Dr. Allena Earing, LOV 11/12/22. Patient states that currently she can walk and transfer without assistance. I spoke with Corrie Dandy, scheduler for Dr. Henderson Cloud. Corrie Dandy is requesting neurology clearance from Dr. Gaynell Face. Received neurology clearance and placed in chart on 01/10/23.

## 2023-01-10 NOTE — Progress Notes (Signed)
Your procedure is scheduled on Tuesday, January 22, 2023.  Report to [facility] AT  5:30  AM.   Call this number if you have problems the morning of surgery  :[phone].   OUR ADDRESS IS [address].  WE ARE LOCATED IN [facility].  PLEASE BRING YOUR INSURANCE CARD AND PHOTO ID DAY OF SURGERY.  ONLY 2 PEOPLE ARE ALLOWED IN  WAITING  ROOM                                      REMEMBER:  DO NOT EAT FOOD, CANDY GUM OR MINTS  AFTER MIDNIGHT THE NIGHT BEFORE YOUR SURGERY . YOU MAY HAVE CLEAR LIQUIDS FROM MIDNIGHT THE NIGHT BEFORE YOUR SURGERY UNTIL  4:30 AM. NO CLEAR LIQUIDS AFTER   4:30 AM DAY OF SURGERY.  YOU MAY  BRUSH YOUR TEETH MORNING OF SURGERY AND RINSE YOUR MOUTH OUT, NO CHEWING GUM CANDY OR MINTS.     CLEAR LIQUID DIET    Allowed      Water                                                                   Coffee and tea, regular and decaf  (NO cream or milk products of any type, may sweeten)                         Carbonated beverages, regular and diet                                    Sports drinks like Gatorade _____________________________________________________________________     TAKE ONLY THESE MEDICATIONS MORNING OF SURGERY: Celexa, Synthroid                                        DO NOT WEAR JEWERLY/  METAL/  PIERCINGS (INCLUDING NO PLASTIC PIERCINGS) DO NOT WEAR LOTIONS, POWDERS, PERFUMES OR NAIL POLISH ON YOUR FINGERNAILS. TOENAIL POLISH IS OK TO WEAR. DO NOT SHAVE FOR 48 HOURS PRIOR TO DAY OF SURGERY.  CONTACTS, GLASSES, OR DENTURES MAY NOT BE WORN TO SURGERY.  REMEMBER: NO SMOKING, VAPING ,  DRUGS OR ALCOHOL FOR 24 HOURS BEFORE YOUR SURGERY.                                    [facility] IS NOT RESPONSIBLE  FOR ANY BELONGINGS.                                                                    Marland Kitchen           [place] [facility] - Preparing  for Surgery Before surgery, you can play an important role.  Because skin  is not sterile, your skin needs to be as free of germs as possible.  You can reduce the number of germs on your skin by washing with CHG (chlorahexidine gluconate) soap before surgery.  CHG is an antiseptic cleaner which kills germs and bonds with the skin to continue killing germs even after washing. Please DO NOT use if you have an allergy to CHG or antibacterial soaps.  If your skin becomes reddened/irritated stop using the CHG and inform your nurse when you arrive at Short Stay. Do not shave (including legs and underarms) for at least 48 hours prior to the first CHG shower.  You may shave your face/neck. Please follow these instructions carefully:  1.  Shower with CHG Soap the night before surgery and the  morning of Surgery.  2.  If you choose to wash your hair, wash your hair first as usual with your  normal  shampoo.  3.  After you shampoo, rinse your hair and body thoroughly to remove the  shampoo.                                        4.  Use CHG as you would any other liquid soap.  You can apply chg directly  to the skin and wash , chg soap provided, night before and morning of your surgery.  5.  Apply the CHG Soap to your body ONLY FROM THE NECK DOWN.   Do not use on face/ open                           Wound or open sores. Avoid contact with eyes, ears mouth and genitals (private parts).                       Wash face,  Genitals (private parts) with your normal soap.             6.  Wash thoroughly, paying special attention to the area where your surgery  will be performed.  7.  Thoroughly rinse your body with warm water from the neck down.  8.  DO NOT shower/wash with your normal soap after using and rinsing off  the CHG Soap.             9.  Pat yourself dry with a clean towel.            10.  Wear clean pajamas.            11.  Place clean sheets on your bed the night of your first shower and do not  sleep with pets. Day of Surgery : Do not apply any lotions/ powders the morning of  surgery.  Please wear clean clothes to the hospital/surgery center.  IF YOU HAVE ANY SKIN IRRITATION OR PROBLEMS WITH THE SURGICAL SOAP, PLEASE GET A BAR OF GOLD DIAL SOAP AND SHOWER THE NIGHT BEFORE YOUR SURGERY AND THE MORNING OF YOUR SURGERY. PLEASE LET THE NURSE KNOW MORNING OF YOUR SURGERY IF YOU HAD ANY PROBLEMS WITH THE SURGICAL SOAP.   YOUR SURGEON MAY HAVE REQUESTED EXTENDED RECOVERY TIME AFTER YOUR SURGERY. IT COULD BE A  JUST A FEW HOURS  UP TO AN OVERNIGHT STAY.  YOUR SURGEON SHOULD HAVE DISCUSSED THIS WITH YOU  PRIOR TO YOUR SURGERY. IN THE EVENT YOU NEED TO STAY OVERNIGHT PLEASE REFER TO THE FOLLOWING GUIDELINES. YOU MAY HAVE UP TO 4 VISITORS  MAY VISIT IN THE EXTENDED RECOVERY ROOM UNTIL 800 PM ONLY.  ONE  VISITOR AGE 40 AND OVER MAY SPEND THE NIGHT AND MUST BE IN EXTENDED RECOVERY ROOM NO LATER THAN 800 PM . YOUR DISCHARGE TIME AFTER YOU SPEND THE NIGHT IS 900 AM THE MORNING AFTER YOUR SURGERY. YOU MAY PACK A SMALL OVERNIGHT BAG WITH TOILETRIES FOR YOUR OVERNIGHT STAY IF YOU WISH.  REGARDLESS OF IF YOU STAY OVER NIGHT OR ARE DISCHARGED THE SAME DAY YOU WILL BE REQUIRED TO HAVE A RESPONSIBLE ADULT (18 YRS OLD OR OLDER) STAY WITH YOU FOR AT LEAST THE FIRST 24 HOURS  YOUR PRESCRIPTION MEDICATIONS WILL BE PROVIDED DURING YOUR HOSPITAL STAY.  ________________________________________________________________________                                                        QUESTIONS Lisa [name] PRE OP NURSE PHONE [phone].

## 2023-01-15 ENCOUNTER — Encounter: Payer: Self-pay | Admitting: Hematology

## 2023-01-15 ENCOUNTER — Encounter (HOSPITAL_COMMUNITY)
Admission: RE | Admit: 2023-01-15 | Discharge: 2023-01-15 | Disposition: A | Discharge status: Home / Self Care | Payer: 59 | Source: Ambulatory Visit | Attending: Obstetrics and Gynecology | Admitting: Obstetrics and Gynecology

## 2023-01-15 DIAGNOSIS — Z01818 Encounter for other preprocedural examination: Secondary | ICD-10-CM

## 2023-01-15 DIAGNOSIS — Z01812 Encounter for preprocedural laboratory examination: Secondary | ICD-10-CM | POA: Diagnosis not present

## 2023-01-15 LAB — CBC
HCT: 41.7 % (ref 36.0–46.0)
Hemoglobin: 13.5 g/dL (ref 12.0–15.0)
MCH: 30.7 pg (ref 26.0–34.0)
MCHC: 32.4 g/dL (ref 30.0–36.0)
MCV: 94.8 fL (ref 80.0–100.0)
Platelets: 216 10*3/uL (ref 150–400)
RBC: 4.4 MIL/uL (ref 3.87–5.11)
RDW: 12.8 % (ref 11.5–15.5)
WBC: 5.2 10*3/uL (ref 4.0–10.5)
nRBC: 0 % (ref 0.0–0.2)

## 2023-01-15 LAB — BASIC METABOLIC PANEL
Anion gap: 11 (ref 5–15)
BUN: 13 mg/dL (ref 6–20)
CO2: 23 mmol/L (ref 22–32)
Calcium: 8.8 mg/dL — ABNORMAL LOW (ref 8.9–10.3)
Chloride: 105 mmol/L (ref 98–111)
Creatinine, Ser: 0.77 mg/dL (ref 0.44–1.00)
GFR, Estimated: >60 mL/min (ref >60)
Glucose, Bld: 72 mg/dL (ref 70–99)
Potassium: 3.9 mmol/L (ref 3.5–5.1)
Sodium: 139 mmol/L (ref 135–145)

## 2023-01-16 NOTE — H&P (Signed)
Lisa [name] is an 42 y.o. female. She has heavy, irregular menses leading to anemia. She cannot tolerated OCP due to Hx of side effects, MS, migraines and TIA. She had reaction to initial attempt to transfuse Fe. A different protocol was tolerated. Office U/S notes a 4 cm fibroids. An abdominal/pelvic CT measured fibroid @ 3.9 cm and a 3.1 cm corpus luteum cyst of right ovary in April of this year.  Pertinent Gynecological History: Menses: flow is excessive with use of many pads or tampons on heaviest days Bleeding:  Contraception: vasectomy DES exposure: denies Blood transfusions: none Sexually transmitted diseases: no past history Previous GYN Procedures:  Last mammogram: normal Date: 2023 Last pap: normal Date: 2023 OB History: G2, P2   Menstrual History: Menarche age:  Patient's last menstrual period was 12/25/2022 (exact date).    Past Medical History:  Diagnosis Date   Anemia    2024 recieving iron infusions / Follows with [facility].   Anxiety    Follows w/ PCP.   Headache(784.0)    hx of migraines, rarely has migraines anymore   Hypothyroidism    Multiple sclerosis (HCC)    Follows w/ [facility], Dr. Clint Bolder.   Pneumonia 06/26/2020   right lower lobe   PONV (postoperative nausea and vomiting)    Pre-diabetes    RLS (restless legs syndrome)    Stroke [facility])    Patient states that she was hospitalized around 2021 for facial weakness (pt states face was not working). She states all symptoms resolved in 4 days. Patient states she was never given a clear answer if it was a stroke or more likely an MS flare.   Thyroid nodule    Urinary incontinence    with MS flares    Past Surgical History:  Procedure Laterality Date   ADENOIDECTOMY     as a child   CESAREAN SECTION  2006   CESAREAN SECTION  2008   THYROIDECTOMY  06/18/2011   Procedure: THYROIDECTOMY;  Surgeon: Christia Reading, MD;  Location: [facility] OR;  Service: ENT;  Laterality: Right;    TONSILLECTOMY     as a child   TYMPANOPLASTY     around 2008    Family History  Problem Relation Age of Onset   Breast cancer Maternal Grandmother 51   Breast cancer Cousin 77       maternal first cousin    Social History:  reports that she has quit smoking. Her smoking use included cigarettes. She has never used smokeless tobacco. She reports that she does not currently use alcohol. She reports that she does not use drugs.  Allergies:  Allergies  Allergen Reactions   Sulfa Antibiotics Anaphylaxis   Venofer [Iron Sucrose] Hives, Shortness Of Breath and Other (See Comments)    Also complained of knee, hand, and finger stiffness. Reaction occurred during post-infusion monitoring period. Required IV diphenhydramine. See progress note from 08/07/2022.   Darvocet [Propoxyphene N-Acetaminophen] Other (See Comments)    Hallucinations    Imitrex [Sumatriptan Base] Other (See Comments)    Lock-jaw   Keflex [Cephalexin] Rash    Childhood allergy   Penicillins Rash    Childhood allergy    No medications prior to admission.    Review of Systems  Constitutional:  Negative for fever.    Weight 95.3 kg, last menstrual period 12/25/2022. Physical Exam Cardiovascular:     Rate and Rhythm: Normal rate.  Pulmonary:     Effort: Pulmonary effort is normal.  Results for orders placed or performed during the hospital encounter of 01/15/23 (from the past 24 hour(s))  CBC     Status: None   Collection Time: 01/15/23 10:44 AM  Result Value Ref Range   WBC 5.2 4.0 - 10.5 K/uL   RBC 4.40 3.87 - 5.11 MIL/uL   Hemoglobin 13.5 12.0 - 15.0 g/dL   HCT 40.9 81.1 - 91.4 %   MCV 94.8 80.0 - 100.0 fL   MCH 30.7 26.0 - 34.0 pg   MCHC 32.4 30.0 - 36.0 g/dL   RDW 78.2 95.6 - 21.3 %   Platelets 216 150 - 400 K/uL   nRBC 0.0 0.0 - 0.2 %  Type and screen [facility]     Status: None   Collection Time: 01/15/23 10:44 AM  Result Value Ref Range   ABO/RH(D) O POS    Antibody  Screen NEG    Sample Expiration 01/29/2023,2359    Extend sample reason      NO TRANSFUSIONS OR PREGNANCY IN THE PAST 3 MONTHS Performed at [facility], [address] [place], [place] [address]   Basic metabolic panel per protocol     Status: Abnormal   Collection Time: 01/15/23 10:44 AM  Result Value Ref Range   Sodium 139 135 - 145 mmol/L   Potassium 3.9 3.5 - 5.1 mmol/L   Chloride 105 98 - 111 mmol/L   CO2 23 22 - 32 mmol/L   Glucose, Bld 72 70 - 99 mg/dL   BUN 13 6 - 20 mg/dL   Creatinine, Ser 8.46 0.44 - 1.00 mg/dL   Calcium 8.8 (L) 8.9 - 10.3 mg/dL   GFR, Estimated >96 >29 mL/min   Anion gap 11 5 - 15    No results found.  Assessment/Plan: 42 yo G2P2 with heavy, irregular menses LAVH/BS with removal of an abnormal ovary discussed along with risks including infection, organ damage, bleeding/transfusion-HIV/Hep, DVT/PE, pneumonia, laparotomy. She states she understands and agrees  Roselle Locus II 01/16/2023, 9:55 AM

## 2023-01-17 ENCOUNTER — Inpatient Hospital Stay: Payer: 59 | Attending: Oncology | Admitting: Oncology

## 2023-01-17 VITALS — BP 113/82 | HR 66 | Temp 98.2°F | Resp 18 | Ht 69.0 in | Wt 220.4 lb

## 2023-01-17 DIAGNOSIS — D259 Leiomyoma of uterus, unspecified: Secondary | ICD-10-CM | POA: Insufficient documentation

## 2023-01-17 DIAGNOSIS — D500 Iron deficiency anemia secondary to blood loss (chronic): Secondary | ICD-10-CM | POA: Insufficient documentation

## 2023-01-17 DIAGNOSIS — D509 Iron deficiency anemia, unspecified: Secondary | ICD-10-CM

## 2023-01-17 NOTE — Progress Notes (Signed)
[facility] [address], [place] [address]   Clinic Day:  01/17/2023  Referring physician: Babs Sciara, MD  Patient Care Team: Babs Sciara, MD as PCP - General (Family Medicine) Doreatha Massed, MD as Medical Oncologist (Hematology)   ASSESSMENT & PLAN:   Assessment:  1.  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 - Labs on 06/18/2022: Ferritin 8, percent saturation 7, hemoglobin 11. - Positive for ice pica.  No prior history of transfusion or parenteral iron. - She cannot tolerate oral iron therapy. - She reports night sweats for the past 3 to 4 months, 3 times per week, drenching type.  2.  Social/family history: - Lives at home with her husband and children.  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  Non-smoker. - MGM: Breast cancer, PGM: Colon cancer, maternal first cousin died of TNBC at 70.  3.  Multiple sclerosis: - Usual symptoms: Numbness on the right side of the body, balance problems and vision issues. Ramiro Harvest every 6 months for the past 3 years.  Plan:  1.  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 -She received 4 doses of Ferrlecit from 10/30/2022 through 12/06/2022. -Unable to tolerate IV Venofer.   -She is scheduled for total hysterectomy on 01/22/2023. -Recommend 4 additional doses of Ferrlecit after hysterectomy and follow-up in mid March with labs a week before.  PLAN SUMMARY: >> Recommend 4 doses of Ferrlecit after hysterectomy beginning on 01/28/2023. >> RTC mid March for lab work and see provider after she returns from [place].    No orders of the defined types were placed in this encounter.   Mauro Kaufmann, NP   10/3/20249:36 AM  CHIEF COMPLAINT/PURPOSE OF CONSULT:   Diagnosis: iron deficiency anemia  Current Therapy:  Parenteral iron therapy  HISTORY OF PRESENT ILLNESS:   Lisa [name] is a 42 y.o. female presenting to clinic today for follow-up for iron deficiency anemia.  She received 4 doses of IV Ferrlecit from 10/30/2022 through 12/06/2022 with improvement of her energy levels.  She is scheduled for total hysterectomy on 01/22/2023.  She is moving to [place] on 02/22/2023-06/22/2023 for her son who will require 34-month hospitalization due to an underlying orthopedic issue.  Denies any recent hospitalizations, surgeries or changes in her baseline health.  She reports energy of 90% appetite of 100%.  Denies any pain.  She continues to have heavy menstrual cycles but is hoping after her hysterectomy all of her iron deficiency issues will resolve.  She has history of MS and is followed by neurology at [facility].  Ramiro Harvest every 6 months and her next dose will be given on 02/04/2023.   PAST MEDICAL HISTORY:   Past Medical History: Past Medical History:  Diagnosis Date   Anemia    2024 recieving iron infusions / Follows with [facility].   Anxiety    Follows w/ PCP.   Headache(784.0)    hx of migraines, rarely has migraines anymore   Hypothyroidism    Multiple sclerosis (HCC)    Follows w/ [facility], Dr. Clint Bolder.   Pneumonia 06/26/2020   right lower lobe   PONV (postoperative nausea and vomiting)    Pre-diabetes    RLS (restless legs syndrome)    Stroke [facility])    Patient states that  she was hospitalized around 2021 for facial weakness (pt states face was not working). She states all symptoms resolved in 4 days. Patient states she was never given a clear answer if it was a stroke or more likely an MS flare.   Thyroid nodule    Urinary incontinence    with MS flares    Surgical History: Past Surgical History:  Procedure Laterality Date   ADENOIDECTOMY     as a child   CESAREAN SECTION  2006   CESAREAN SECTION  2008   THYROIDECTOMY  06/18/2011   Procedure:  THYROIDECTOMY;  Surgeon: Christia Reading, MD;  Location: [facility] OR;  Service: ENT;  Laterality: Right;   TONSILLECTOMY     as a child   TYMPANOPLASTY     around 2008    Social History: Social History   Socioeconomic History   Marital status: Married    Spouse name: Not on file   Number of children: Not on file   Years of education: Not on file   Highest education level: Not on file  Occupational History   Not on file  Tobacco Use   Smoking status: Former    Types: Cigarettes   Smokeless tobacco: Never   Tobacco comments:    Only smoked in high school for about a year. Patient states she only smoked about 2 cigarettes per day.  Vaping Use   Vaping status: Never Used  Substance and Sexual Activity   Alcohol use: Not Currently   Drug use: No   Sexual activity: Yes    Birth control/protection: None  Other Topics Concern   Not on file  Social History Narrative   Not on file   Social Determinants of Health   Financial Resource Strain: Not on file  Food Insecurity: No Food Insecurity (08/03/2022)   Hunger Vital Sign    Worried About Running Out of Food in the Last Year: Never true    Ran Out of Food in the Last Year: Never true  Transportation Needs: No Transportation Needs (08/03/2022)   PRAPARE - Administrator, Civil Service (Medical): No    Lack of Transportation (Non-Medical): No  Physical Activity: Not on file  Stress: Not on file  Social Connections: Not on file  Intimate Partner Violence: Not At Risk (08/03/2022)   Humiliation, Afraid, Rape, and Kick questionnaire    Fear of Current or Ex-Partner: No    Emotionally Abused: No    Physically Abused: No    Sexually Abused: No    Family History: Family History  Problem Relation Age of Onset   Breast cancer Maternal Grandmother 14   Breast cancer Cousin 56       maternal first cousin    Current Medications:  Current Outpatient Medications:    baclofen (LIORESAL) 20 MG tablet, Take 20 mg by mouth at  bedtime as needed for muscle spasms., Disp: , Rfl:    Cholecalciferol (VITAMIN D-3 PO), Take 1 capsule by mouth daily., Disp: , Rfl:    citalopram (CELEXA) 40 MG tablet, Take 1 tablet (40 mg total) by mouth daily. (Patient taking differently: Take 20 mg by mouth daily.), Disp: 90 tablet, Rfl: 2   Cyanocobalamin (VITAMIN B-12 PO), Take 1 tablet by mouth daily., Disp: , Rfl:    ferric gluconate (FERRLECIT) 12.5 MG/ML injection, Every 2 weeks, Disp: , Rfl:    ibuprofen (ADVIL) 200 MG tablet, Take 800 mg by mouth 2 (two) times daily as needed for headache or moderate pain.,  Disp: , Rfl:    levothyroxine (SYNTHROID) 100 MCG tablet, Take 1 tablet (100 mcg total) by mouth daily before breakfast., Disp: 90 tablet, Rfl: 2   Ocrelizumab (OCREVUS IV), Inject 600 mg into the vein every 6 (six) months., Disp: , Rfl:    rOPINIRole (REQUIP) 0.25 MG tablet, Take 0.5 mg by mouth at bedtime as needed (muscle spasms)., Disp: , Rfl:    Allergies: Allergies  Allergen Reactions   Sulfa Antibiotics Anaphylaxis   Venofer [Iron Sucrose] Hives, Shortness Of Breath and Other (See Comments)    Also complained of knee, hand, and finger stiffness. Reaction occurred during post-infusion monitoring period. Required IV diphenhydramine. See progress note from 08/07/2022.   Darvocet [Propoxyphene N-Acetaminophen] Other (See Comments)    Hallucinations    Imitrex [Sumatriptan Base] Other (See Comments)    Lock-jaw   Keflex [Cephalexin] Rash    Childhood allergy   Penicillins Rash    Childhood allergy    REVIEW OF SYSTEMS:   Review of Systems  Constitutional:  Negative for fatigue.  Genitourinary:  Positive for menstrual problem and vaginal bleeding.      VITALS:   Blood pressure 113/82, pulse 66, temperature 98.2 F (36.8 C), temperature source Oral, resp. rate 18, height 5\' 9"  (1.753 m), weight 220 lb 6.4 oz (100 kg), last menstrual period 12/25/2022, SpO2 99%.  Wt Readings from Last 3 Encounters:  01/17/23 220 lb  6.4 oz (100 kg)  01/15/23 217 lb (98.4 kg)  09/25/22 212 lb 3.2 oz (96.3 kg)    Body mass index is 32.55 kg/m.   PHYSICAL EXAM:   Physical Exam Constitutional:      Appearance: Normal appearance.  Cardiovascular:     Rate and Rhythm: Normal rate and regular rhythm.  Pulmonary:     Effort: Pulmonary effort is normal.     Breath sounds: Normal breath sounds.  Abdominal:     General: Bowel sounds are normal.     Palpations: Abdomen is soft.  Musculoskeletal:        General: No swelling. Normal range of motion.  Neurological:     Mental Status: She is alert and oriented to person, place, and time. Mental status is at baseline.     LABS:      Latest Ref Rng & Units 01/15/2023   10:44 AM 01/10/2023   10:41 AM 10/25/2022   12:25 PM  CBC  WBC 4.0 - 10.5 K/uL 5.2  4.9  5.4   Hemoglobin 12.0 - 15.0 g/dL 16.1  09.6  04.5   Hematocrit 36.0 - 46.0 % 41.7  39.5  39.3   Platelets 150 - 400 K/uL 216  224  259       Latest Ref Rng & Units 01/15/2023   10:44 AM 08/12/2022   11:30 AM 06/18/2022   10:06 AM  CMP  Glucose 70 - 99 mg/dL 72  82  92   BUN 6 - 20 mg/dL 13  13  14    Creatinine 0.44 - 1.00 mg/dL 4.09  8.11  9.14   Sodium 135 - 145 mmol/L 139  136  138   Potassium 3.5 - 5.1 mmol/L 3.9  3.6  4.7   Chloride 98 - 111 mmol/L 105  103  101   CO2 22 - 32 mmol/L 23  26  22    Calcium 8.9 - 10.3 mg/dL 8.8  8.9  9.4   Total Protein 6.5 - 8.1 g/dL  7.5  7.0   Total Bilirubin 0.3 - 1.2  mg/dL  0.8  0.4   Alkaline Phos 38 - 126 U/L  51  64   AST 15 - 41 U/L  14  14   ALT 0 - 44 U/L  14  11      No results found for: "CEA1", "CEA" / No results found for: "CEA1", "CEA" No results found for: "PSA1" No results found for: "ZOX096" No results found for: "CAN125"  No results found for: "TOTALPROTELP", "ALBUMINELP", "A1GS", "A2GS", "BETS", "BETA2SER", "GAMS", "MSPIKE", "SPEI" Lab Results  Component Value Date   TIBC 404 01/10/2023   TIBC 417 10/25/2022   TIBC 392 09/19/2022   FERRITIN  23 01/10/2023   FERRITIN 13 10/25/2022   FERRITIN 84 09/19/2022   IRONPCTSAT 21 01/10/2023   IRONPCTSAT 17 10/25/2022   IRONPCTSAT 25 09/19/2022   No results found for: "LDH"   STUDIES:   No results found.

## 2023-01-21 MED ORDER — CLINDAMYCIN PHOSPHATE 900 MG/50ML IV SOLN
900.0000 mg | INTRAVENOUS | Status: AC
  Administered 2023-01-22: 900 mg via INTRAVENOUS

## 2023-01-21 MED ORDER — GENTAMICIN SULFATE 40 MG/ML IJ SOLN
5.0000 mg/kg | INTRAVENOUS | Status: AC
  Administered 2023-01-22: 390 mg via INTRAVENOUS
  Filled 2023-01-21: qty 9.75

## 2023-01-22 ENCOUNTER — Encounter (HOSPITAL_BASED_OUTPATIENT_CLINIC_OR_DEPARTMENT_OTHER): Payer: Self-pay | Admitting: Obstetrics and Gynecology

## 2023-01-22 ENCOUNTER — Encounter (HOSPITAL_BASED_OUTPATIENT_CLINIC_OR_DEPARTMENT_OTHER)
Admission: RE | Disposition: A | Discharge status: Home / Self Care | Payer: Self-pay | Source: Home / Self Care | Attending: Obstetrics and Gynecology

## 2023-01-22 ENCOUNTER — Other Ambulatory Visit: Payer: Self-pay

## 2023-01-22 ENCOUNTER — Observation Stay (HOSPITAL_BASED_OUTPATIENT_CLINIC_OR_DEPARTMENT_OTHER)
Admission: RE | Admit: 2023-01-22 | Discharge: 2023-01-23 | Disposition: A | Discharge status: Home / Self Care | Payer: 59 | Attending: Obstetrics and Gynecology | Admitting: Obstetrics and Gynecology

## 2023-01-22 ENCOUNTER — Ambulatory Visit (HOSPITAL_BASED_OUTPATIENT_CLINIC_OR_DEPARTMENT_OTHER): Payer: 59 | Admitting: Anesthesiology

## 2023-01-22 DIAGNOSIS — Z8673 Personal history of transient ischemic attack (TIA), and cerebral infarction without residual deficits: Secondary | ICD-10-CM | POA: Insufficient documentation

## 2023-01-22 DIAGNOSIS — E039 Hypothyroidism, unspecified: Secondary | ICD-10-CM | POA: Diagnosis not present

## 2023-01-22 DIAGNOSIS — Z01818 Encounter for other preprocedural examination: Secondary | ICD-10-CM

## 2023-01-22 DIAGNOSIS — N939 Abnormal uterine and vaginal bleeding, unspecified: Secondary | ICD-10-CM | POA: Insufficient documentation

## 2023-01-22 DIAGNOSIS — D259 Leiomyoma of uterus, unspecified: Secondary | ICD-10-CM

## 2023-01-22 DIAGNOSIS — Z87891 Personal history of nicotine dependence: Secondary | ICD-10-CM | POA: Diagnosis not present

## 2023-01-22 HISTORY — PX: LAPAROSCOPIC VAGINAL HYSTERECTOMY WITH SALPINGECTOMY: SHX6680

## 2023-01-22 HISTORY — DX: Restless legs syndrome: G25.81

## 2023-01-22 HISTORY — DX: Multiple sclerosis: G35

## 2023-01-22 HISTORY — DX: Cerebral infarction, unspecified: I63.9

## 2023-01-22 HISTORY — DX: Unspecified urinary incontinence: R32

## 2023-01-22 HISTORY — DX: Anemia, unspecified: D64.9

## 2023-01-22 HISTORY — DX: Hypothyroidism, unspecified: E03.9

## 2023-01-22 HISTORY — DX: Prediabetes: R73.03

## 2023-01-22 LAB — TYPE AND SCREEN
ABO/RH(D): O POS
Antibody Screen: NEGATIVE

## 2023-01-22 LAB — ABO/RH: ABO/RH(D): O POS

## 2023-01-22 LAB — POCT PREGNANCY, URINE: Preg Test, Ur: NEGATIVE

## 2023-01-22 SURGERY — HYSTERECTOMY, VAGINAL, LAPAROSCOPY-ASSISTED, WITH SALPINGECTOMY
Anesthesia: General | Site: Vagina | Laterality: Bilateral

## 2023-01-22 MED ORDER — SCOPOLAMINE 1 MG/3DAYS TD PT72
MEDICATED_PATCH | TRANSDERMAL | Status: AC
  Filled 2023-01-22: qty 1

## 2023-01-22 MED ORDER — FENTANYL CITRATE (PF) 100 MCG/2ML IJ SOLN
INTRAMUSCULAR | Status: DC | PRN
  Administered 2023-01-22: 25 ug via INTRAVENOUS
  Administered 2023-01-22 (×3): 50 ug via INTRAVENOUS
  Administered 2023-01-22: 25 ug via INTRAVENOUS

## 2023-01-22 MED ORDER — TRAMADOL HCL 50 MG PO TABS
ORAL_TABLET | ORAL | Status: AC
  Filled 2023-01-22: qty 1

## 2023-01-22 MED ORDER — ALUM & MAG HYDROXIDE-SIMETH 200-200-20 MG/5ML PO SUSP: 30.0000 mL | ORAL | Status: DC | PRN

## 2023-01-22 MED ORDER — TRAMADOL HCL 50 MG PO TABS
50.0000 mg | ORAL_TABLET | Freq: Four times a day (QID) | ORAL | Status: DC | PRN
  Administered 2023-01-22 – 2023-01-23 (×3): 50 mg via ORAL

## 2023-01-22 MED ORDER — MUPIROCIN 2 % EX OINT
1.0000 | TOPICAL_OINTMENT | Freq: Two times a day (BID) | CUTANEOUS | Status: DC
  Filled 2023-01-22: qty 22

## 2023-01-22 MED ORDER — LEVOTHYROXINE SODIUM 100 MCG PO TABS
100.0000 ug | ORAL_TABLET | Freq: Every day | ORAL | Status: DC
  Administered 2023-01-23: 100 ug via ORAL
  Filled 2023-01-22: qty 1

## 2023-01-22 MED ORDER — ACETAMINOPHEN 10 MG/ML IV SOLN
INTRAVENOUS | Status: AC
  Filled 2023-01-22: qty 100

## 2023-01-22 MED ORDER — HEMOSTATIC AGENTS (NO CHARGE) OPTIME
TOPICAL | Status: DC | PRN
Start: 2023-01-22 — End: 2023-01-22
  Administered 2023-01-22: 1 via TOPICAL

## 2023-01-22 MED ORDER — ONDANSETRON HCL 4 MG/2ML IJ SOLN
INTRAMUSCULAR | Status: AC
  Filled 2023-01-22: qty 2

## 2023-01-22 MED ORDER — IBUPROFEN 200 MG PO TABS
ORAL_TABLET | ORAL | Status: AC
  Filled 2023-01-22: qty 3

## 2023-01-22 MED ORDER — ACETAMINOPHEN 325 MG PO TABS: 325.0000 mg | ORAL_TABLET | ORAL | Status: DC | PRN

## 2023-01-22 MED ORDER — LACTATED RINGERS IV SOLN: INTRAVENOUS | Status: DC

## 2023-01-22 MED ORDER — ROCURONIUM BROMIDE 100 MG/10ML IV SOLN
INTRAVENOUS | Status: DC | PRN
  Administered 2023-01-22 (×2): 10 mg via INTRAVENOUS
  Administered 2023-01-22: 20 mg via INTRAVENOUS
  Administered 2023-01-22: 10 mg via INTRAVENOUS
  Administered 2023-01-22: 60 mg via INTRAVENOUS

## 2023-01-22 MED ORDER — FENTANYL CITRATE (PF) 100 MCG/2ML IJ SOLN
25.0000 ug | INTRAMUSCULAR | Status: DC | PRN
  Administered 2023-01-22: 25 ug via INTRAVENOUS

## 2023-01-22 MED ORDER — PROPOFOL 1000 MG/100ML IV EMUL
INTRAVENOUS | Status: AC
  Filled 2023-01-22: qty 200

## 2023-01-22 MED ORDER — LIDOCAINE HCL (CARDIAC) PF 100 MG/5ML IV SOSY
PREFILLED_SYRINGE | INTRAVENOUS | Status: DC | PRN
  Administered 2023-01-22: 50 mg via INTRAVENOUS

## 2023-01-22 MED ORDER — ONDANSETRON HCL 4 MG/2ML IJ SOLN
INTRAMUSCULAR | Status: DC | PRN
  Administered 2023-01-22: 4 mg via INTRAVENOUS

## 2023-01-22 MED ORDER — MENTHOL 3 MG MT LOZG: 1.0000 | LOZENGE | OROMUCOSAL | Status: DC | PRN

## 2023-01-22 MED ORDER — DEXMEDETOMIDINE HCL IN NACL 80 MCG/20ML IV SOLN
INTRAVENOUS | Status: DC | PRN
  Administered 2023-01-22 (×2): 4 ug via INTRAVENOUS
  Administered 2023-01-22: 8 ug via INTRAVENOUS

## 2023-01-22 MED ORDER — KETOROLAC TROMETHAMINE 30 MG/ML IJ SOLN
INTRAMUSCULAR | Status: DC | PRN
  Administered 2023-01-22: 30 mg via INTRAVENOUS

## 2023-01-22 MED ORDER — OXYCODONE HCL 5 MG PO TABS
ORAL_TABLET | ORAL | Status: AC
  Filled 2023-01-22: qty 1

## 2023-01-22 MED ORDER — STERILE WATER FOR IRRIGATION IR SOLN
Status: DC | PRN
Start: 2023-01-22 — End: 2023-01-22
  Administered 2023-01-22: 500 mL

## 2023-01-22 MED ORDER — ONDANSETRON HCL 4 MG PO TABS: 4.0000 mg | ORAL_TABLET | Freq: Four times a day (QID) | ORAL | Status: DC | PRN

## 2023-01-22 MED ORDER — BUPIVACAINE HCL (PF) 0.5 % IJ SOLN
INTRAMUSCULAR | Status: DC | PRN
  Administered 2023-01-22: 10 mL
  Administered 2023-01-22: 20 mL

## 2023-01-22 MED ORDER — POVIDONE-IODINE 10 % EX SWAB: 2.0000 | Freq: Once | CUTANEOUS | Status: DC

## 2023-01-22 MED ORDER — SENNOSIDES-DOCUSATE SODIUM 8.6-50 MG PO TABS: 1.0000 | ORAL_TABLET | Freq: Every evening | ORAL | Status: DC | PRN

## 2023-01-22 MED ORDER — ONDANSETRON HCL 4 MG/2ML IJ SOLN
4.0000 mg | Freq: Four times a day (QID) | INTRAMUSCULAR | Status: DC | PRN
  Administered 2023-01-22: 4 mg via INTRAVENOUS

## 2023-01-22 MED ORDER — DEXAMETHASONE SODIUM PHOSPHATE 4 MG/ML IJ SOLN
INTRAMUSCULAR | Status: DC | PRN
  Administered 2023-01-22: 10 mg via INTRAVENOUS

## 2023-01-22 MED ORDER — HYDROMORPHONE HCL 1 MG/ML IJ SOLN: 0.2000 mg | INTRAMUSCULAR | Status: DC | PRN

## 2023-01-22 MED ORDER — ACETAMINOPHEN 500 MG PO TABS
ORAL_TABLET | ORAL | Status: AC
  Filled 2023-01-22: qty 2

## 2023-01-22 MED ORDER — OXYCODONE HCL 5 MG PO TABS
5.0000 mg | ORAL_TABLET | Freq: Once | ORAL | Status: AC | PRN
  Administered 2023-01-22: 5 mg via ORAL

## 2023-01-22 MED ORDER — CLINDAMYCIN PHOSPHATE 900 MG/50ML IV SOLN
INTRAVENOUS | Status: AC
  Filled 2023-01-22: qty 50

## 2023-01-22 MED ORDER — SUGAMMADEX SODIUM 200 MG/2ML IV SOLN
INTRAVENOUS | Status: DC | PRN
  Administered 2023-01-22: 200 mg via INTRAVENOUS

## 2023-01-22 MED ORDER — ACETAMINOPHEN 500 MG PO TABS
1000.0000 mg | ORAL_TABLET | Freq: Four times a day (QID) | ORAL | Status: DC
  Administered 2023-01-22 – 2023-01-23 (×3): 1000 mg via ORAL

## 2023-01-22 MED ORDER — SODIUM CHLORIDE 0.9 % IR SOLN
Status: DC | PRN
  Administered 2023-01-22: 1000 mL

## 2023-01-22 MED ORDER — SOD CITRATE-CITRIC ACID 500-334 MG/5ML PO SOLN: 30.0000 mL | ORAL | Status: DC

## 2023-01-22 MED ORDER — METHOCARBAMOL 500 MG PO TABS
500.0000 mg | ORAL_TABLET | Freq: Four times a day (QID) | ORAL | Status: DC | PRN
  Administered 2023-01-22 – 2023-01-23 (×2): 500 mg via ORAL

## 2023-01-22 MED ORDER — METHOCARBAMOL 500 MG PO TABS
ORAL_TABLET | ORAL | Status: AC
  Filled 2023-01-22: qty 1

## 2023-01-22 MED ORDER — MIDAZOLAM HCL 2 MG/2ML IJ SOLN
INTRAMUSCULAR | Status: DC | PRN
  Administered 2023-01-22: 2 mg via INTRAVENOUS

## 2023-01-22 MED ORDER — 0.9 % SODIUM CHLORIDE (POUR BTL) OPTIME
TOPICAL | Status: DC | PRN
Start: 2023-01-22 — End: 2023-01-22
  Administered 2023-01-22 (×2): 500 mL

## 2023-01-22 MED ORDER — ACETAMINOPHEN 10 MG/ML IV SOLN: 1000.0000 mg | Freq: Once | INTRAVENOUS | Status: DC | PRN

## 2023-01-22 MED ORDER — DROPERIDOL 2.5 MG/ML IJ SOLN: 0.6250 mg | Freq: Once | INTRAMUSCULAR | Status: DC | PRN

## 2023-01-22 MED ORDER — ACETAMINOPHEN 10 MG/ML IV SOLN
INTRAVENOUS | Status: DC | PRN
Start: 2023-01-22 — End: 2023-01-22
  Administered 2023-01-22: 1000 mg via INTRAVENOUS

## 2023-01-22 MED ORDER — IBUPROFEN 200 MG PO TABS
600.0000 mg | ORAL_TABLET | Freq: Four times a day (QID) | ORAL | Status: DC | PRN
  Administered 2023-01-22 – 2023-01-23 (×2): 600 mg via ORAL

## 2023-01-22 MED ORDER — PROPOFOL 500 MG/50ML IV EMUL
INTRAVENOUS | Status: DC | PRN
Start: 2023-01-22 — End: 2023-01-22
  Administered 2023-01-22 (×2): 175 ug/kg/min via INTRAVENOUS

## 2023-01-22 MED ORDER — MIDAZOLAM HCL 2 MG/2ML IJ SOLN
INTRAMUSCULAR | Status: AC
  Filled 2023-01-22: qty 2

## 2023-01-22 MED ORDER — PROPOFOL 10 MG/ML IV BOLUS
INTRAVENOUS | Status: DC | PRN
  Administered 2023-01-22: 150 mg via INTRAVENOUS

## 2023-01-22 MED ORDER — FENTANYL CITRATE (PF) 100 MCG/2ML IJ SOLN
INTRAMUSCULAR | Status: AC
  Filled 2023-01-22: qty 2

## 2023-01-22 MED ORDER — SCOPOLAMINE 1 MG/3DAYS TD PT72: 1.0000 | MEDICATED_PATCH | TRANSDERMAL | Status: DC

## 2023-01-22 MED ORDER — OXYCODONE HCL 5 MG/5ML PO SOLN: 5.0000 mg | Freq: Once | ORAL | Status: AC | PRN

## 2023-01-22 MED ORDER — ACETAMINOPHEN 160 MG/5ML PO SOLN: 325.0000 mg | ORAL | Status: DC | PRN

## 2023-01-22 MED ORDER — CITALOPRAM HYDROBROMIDE 40 MG PO TABS
40.0000 mg | ORAL_TABLET | Freq: Every day | ORAL | Status: DC
  Administered 2023-01-22: 40 mg via ORAL
  Filled 2023-01-22: qty 1

## 2023-01-22 MED ORDER — GLYCOPYRROLATE 0.2 MG/ML IJ SOLN
INTRAMUSCULAR | Status: DC | PRN
  Administered 2023-01-22: .1 mg via INTRAVENOUS

## 2023-01-22 SURGICAL SUPPLY — 58 items
ADH SKN CLS APL DERMABOND .7 (GAUZE/BANDAGES/DRESSINGS) ×1
APL SRG 38 LTWT LNG FL B (MISCELLANEOUS) ×1
APPLICATOR ARISTA FLEXITIP XL (MISCELLANEOUS) IMPLANT
CATH ROBINSON RED A/P 16FR (CATHETERS) ×1 IMPLANT
COVER BACK TABLE 60X90IN (DRAPES) ×1 IMPLANT
COVER MAYO STAND STRL (DRAPES) ×2 IMPLANT
DEFOGGER SCOPE WARMER CLEARIFY (MISCELLANEOUS) IMPLANT
DERMABOND ADVANCED .7 DNX12 (GAUZE/BANDAGES/DRESSINGS) ×1 IMPLANT
DRAPE SURG IRRIG POUCH 19X23 (DRAPES) ×1 IMPLANT
DURAPREP 26ML APPLICATOR (WOUND CARE) ×1 IMPLANT
ELECT REM PT RETURN 9FT ADLT (ELECTROSURGICAL) ×1
ELECTRODE REM PT RTRN 9FT ADLT (ELECTROSURGICAL) ×1 IMPLANT
GAUZE 4X4 16PLY ~~LOC~~+RFID DBL (SPONGE) ×1 IMPLANT
GLOVE BIO SURGEON STRL SZ8 (GLOVE) ×3 IMPLANT
GLOVE BIOGEL PI IND STRL 7.0 (GLOVE) IMPLANT
GLOVE BIOGEL PI IND STRL 7.5 (GLOVE) IMPLANT
GLOVE ECLIPSE 6.5 STRL STRAW (GLOVE) ×1 IMPLANT
GOWN STRL REUS W/ TWL LRG LVL3 (GOWN DISPOSABLE) IMPLANT
GOWN STRL REUS W/TWL LRG LVL3 (GOWN DISPOSABLE) ×2 IMPLANT
GOWN STRL REUS W/TWL XL LVL3 (GOWN DISPOSABLE) ×2 IMPLANT
HEMOSTAT ARISTA ABSORB 3G PWDR (HEMOSTASIS) IMPLANT
HIBICLENS CHG 4% 4OZ (MISCELLANEOUS) IMPLANT
HOLDER FOLEY CATH W/STRAP (MISCELLANEOUS) ×1 IMPLANT
IRRIG SUCT STRYKERFLOW 2 WTIP (MISCELLANEOUS) ×1
IRRIGATION SUCT STRKRFLW 2 WTP (MISCELLANEOUS) IMPLANT
KIT PINK PAD W/HEAD ARE REST (MISCELLANEOUS) ×1
KIT PINK PAD W/HEAD ARM REST (MISCELLANEOUS) ×2 IMPLANT
KIT TURNOVER CYSTO (KITS) ×1 IMPLANT
LIGASURE IMPACT 36 18CM CVD LR (INSTRUMENTS) IMPLANT
NDL INSUFFLATION 14GA 120MM (NEEDLE) ×1 IMPLANT
NDL INSUFFLATION 14GA 150MM (NEEDLE) IMPLANT
NDL SPNL 22GX3.5 QUINCKE BK (NEEDLE) IMPLANT
NEEDLE INSUFFLATION 14GA 120MM (NEEDLE) ×1 IMPLANT
NEEDLE INSUFFLATION 14GA 150MM (NEEDLE) ×1 IMPLANT
NEEDLE SPNL 22GX3.5 QUINCKE BK (NEEDLE) ×1 IMPLANT
NS IRRIG 500ML POUR BTL (IV SOLUTION) ×1 IMPLANT
PACK LAPAROSCOPY BASIN (CUSTOM PROCEDURE TRAY) IMPLANT
PAD OB MATERNITY 4.3X12.25 (PERSONAL CARE ITEMS) ×1 IMPLANT
PAD PREP 24X48 CUFFED NSTRL (MISCELLANEOUS) ×1 IMPLANT
PENCIL SMOKE EVACUATOR (MISCELLANEOUS) IMPLANT
SEALER TISSUE G2 CVD JAW 45CM (ENDOMECHANICALS) ×1 IMPLANT
SET TUBE SMOKE EVAC HIGH FLOW (TUBING) ×1 IMPLANT
SLEEVE SCD COMPRESS KNEE MED (STOCKING) ×1 IMPLANT
SPIKE FLUID TRANSFER (MISCELLANEOUS) IMPLANT
SPONGE T-LAP 4X18 ~~LOC~~+RFID (SPONGE) IMPLANT
SUT MNCRL 0 MO-4 VIOLET 18 CR (SUTURE) ×2 IMPLANT
SUT VIC AB 4-0 PS2 18 (SUTURE) ×1 IMPLANT
SUT VICRYL 0 UR6 27IN ABS (SUTURE) ×1 IMPLANT
SYR 30ML LL (SYRINGE) ×1 IMPLANT
SYR BULB IRRIG 60ML STRL (SYRINGE) IMPLANT
TOWEL OR 17X24 6PK STRL BLUE (TOWEL DISPOSABLE) ×1 IMPLANT
TRAY FOLEY W/BAG SLVR 14FR LF (SET/KITS/TRAYS/PACK) ×1 IMPLANT
TROCAR Z-THREAD BLADED 5X100MM (TROCAR) ×1 IMPLANT
TROCAR Z-THREAD FIOS 11X100 BL (TROCAR) ×1 IMPLANT
TUBE CONNECTING 12X1/4 (SUCTIONS) IMPLANT
WARMER LAPAROSCOPE (MISCELLANEOUS) IMPLANT
WATER STERILE IRR 500ML POUR (IV SOLUTION) IMPLANT
YANKAUER SUCT BULB TIP NO VENT (SUCTIONS) IMPLANT

## 2023-01-22 NOTE — Anesthesia Preprocedure Evaluation (Addendum)
Anesthesia Evaluation  Patient identified by MRN, date of birth, ID band Patient awake    Reviewed: Allergy & Precautions, NPO status , Patient's Chart, lab work & pertinent test results  History of Anesthesia Complications (+) PONV and history of anesthetic complications  Airway Mallampati: III  TM Distance: >3 FB Neck ROM: Full  Mouth opening: Limited Mouth Opening  Dental  (+) Teeth Intact, Dental Advisory Given   Pulmonary former smoker   breath sounds clear to auscultation       Cardiovascular  Rhythm:Regular Rate:Normal     Neuro/Psych  Headaches PSYCHIATRIC DISORDERS Anxiety     CVA    GI/Hepatic Neg liver ROS,GERD  ,,  Endo/Other  Hypothyroidism    Renal/GU negative Renal ROS     Musculoskeletal negative musculoskeletal ROS (+)    Abdominal   Peds  Hematology  (+) Blood dyscrasia, anemia   Anesthesia Other Findings   Reproductive/Obstetrics                             Anesthesia Physical Anesthesia Plan  ASA: 2  Anesthesia Plan: General   Post-op Pain Management: Tylenol PO (pre-op)*   Induction: Intravenous  PONV Risk Score and Plan: 4 or greater and Ondansetron, Dexamethasone, Midazolam and Scopolamine patch - Pre-op  Airway Management Planned: Oral ETT and Video Laryngoscope Planned  Additional Equipment: None  Intra-op Plan:   Post-operative Plan: Extubation in OR  Informed Consent: I have reviewed the patients History and Physical, chart, labs and discussed the procedure including the risks, benefits and alternatives for the proposed anesthesia with the patient or authorized representative who has indicated his/her understanding and acceptance.     Dental advisory given  Plan Discussed with: CRNA  Anesthesia Plan Comments:        Anesthesia Quick Evaluation

## 2023-01-22 NOTE — Progress Notes (Signed)
01/22/2023  9:50 AM  PATIENT:  Lisa [name]  42 y.o. female  PRE-OPERATIVE DIAGNOSIS:  ABNORMAL UTERINE BLEEDING  FIBROIDS  POST-OPERATIVE DIAGNOSIS:  ABNORMAL UTERINE BLEEDING FIBROIDS  PROCEDURE:  Procedure(s): LAPAROSCOPIC ASSISTED VAGINAL HYSTERECTOMY WITH SALPINGECTOMY (Bilateral)  SURGEON:  Surgeons and Role:    * Harold Hedge, MD - Primary    * Zelphia Cairo, MD - Assisting  PHYSICIAN ASSISTANT:   ASSISTANTS:   ANESTHESIA:   general  EBL:  350 mL   BLOOD ADMINISTERED:none  DRAINS: Urinary Catheter (Foley)   LOCAL MEDICATIONS USED:  MARCAINE    and Amount: 30 ml  SPECIMEN:  Source of Specimen:  uterus bilateral fallopian tubes  DISPOSITION OF SPECIMEN:  PATHOLOGY  COUNTS:  YES  TOURNIQUET:  * No tourniquets in log *  DICTATION: .Other Dictation: Dictation Number [id number]  PLAN OF CARE: Discharge to home after PACU  PATIENT DISPOSITION:  PACU - hemodynamically stable.   Delay start of Pharmacological VTE agent (>24hrs) due to surgical blood loss or risk of bleeding: not applicable

## 2023-01-22 NOTE — Progress Notes (Signed)
Tolerating regular diet, passing flatus, good pain relief A little dizzy when up on her feet, has ambulated well in hall, voided for about 200 ml   Today's Vitals   01/22/23 1158 01/22/23 1159 01/22/23 1315 01/22/23 1402  BP:  96/68 94/62 (!) 96/58  Pulse:  (!) 59 85 66  Resp:   18   Temp:   98.1 F (36.7 C)   TempSrc:      SpO2:  100% 100% 95%  Weight:      Height:      PainSc: 6    2    Body mass index is 32.05 kg/m.   NAD, smiling Skin W&D Abdomen soft, good BS x 4  A/P: Stable         BP soft, I think it is unlikely she has PO bleeding at this point. Will continue IV fluids and discharge in am.

## 2023-01-22 NOTE — Anesthesia Procedure Notes (Signed)
Procedure Name: Intubation Date/Time: 01/22/2023 7:45 AM  Performed by: Earmon Phoenix, CRNAPre-anesthesia Checklist: Patient identified, Emergency Drugs available, Suction available, Patient being monitored and Timeout performed Patient Re-evaluated:Patient Re-evaluated prior to induction Oxygen Delivery Method: Circle system utilized Preoxygenation: Pre-oxygenation with 100% oxygen Induction Type: IV induction Ventilation: Mask ventilation without difficulty Laryngoscope Size: Glidescope and 3 Grade View: Grade I Tube type: Oral Tube size: 7.0 mm Number of attempts: 1 Airway Equipment and Method: Stylet Placement Confirmation: ETT inserted through vocal cords under direct vision, positive ETCO2 and breath sounds checked- equal and bilateral Secured at: 22 cm Tube secured with: Tape Dental Injury: Teeth and Oropharynx as per pre-operative assessment

## 2023-01-22 NOTE — Transfer of Care (Signed)
Immediate Anesthesia Transfer of Care Note  Patient: Lisa [name]  Procedure(s) Performed: LAPAROSCOPIC ASSISTED VAGINAL HYSTERECTOMY WITH SALPINGECTOMY (Bilateral: Vagina )  Patient Location: PACU  Anesthesia Type:General  Level of Consciousness: awake, alert , oriented, and patient cooperative  Airway & Oxygen Therapy: Patient Spontanous Breathing and Patient connected to nasal cannula oxygen  Post-op Assessment: Report given to RN and Post -op Vital signs reviewed and stable  Post vital signs: Reviewed and stable  Last Vitals:  Vitals Value Taken Time  BP 96/86 01/22/23 1006  Temp 36.7 C 01/22/23 1007  Pulse 69 01/22/23 1007  Resp 16 01/22/23 1007  SpO2 100 % 01/22/23 1007  Vitals shown include unfiled device data.  Last Pain:  Vitals:   01/22/23 0606  TempSrc: Oral  PainSc: 0-No pain      Patients Stated Pain Goal: 5 (01/22/23 0606)  Complications: No notable events documented.

## 2023-01-22 NOTE — Progress Notes (Signed)
No changes to H&P per patient history Reviewed procedure-LAVH/BS, removal of ovary if abnormal All questions answered She states she understands and agrees

## 2023-01-22 NOTE — Op Note (Signed)
NAMEKERYL, BREDESEN MEDICAL RECORD NO: [id number] ACCOUNT NO: [id number] DATE OF BIRTH: 03-13-1981 FACILITY: [facility] LOCATION: [facility] PHYSICIAN: Guy Sandifer. Arleta Creek, MD  Operative Report   DATE OF PROCEDURE: 01/22/2023  PREOPERATIVE DIAGNOSIS:  Abnormal uterine bleeding and fibroids.  POSTOPERATIVE DIAGNOSIS:  Abnormal uterine bleeding and fibroids.  PROCEDURE:  Laparoscopically-assisted vaginal hysterectomy with bilateral salpingectomy.  SURGEON:  Guy Sandifer. Arleta Creek, MD  ASSISTANT:  Zelphia Cairo, M.D.  ANESTHESIA:  General with endotracheal intubation.  ESTIMATED BLOOD LOSS:  350 mL.  SPECIMENS: Uterus and bilateral fallopian tubes to pathology.  INDICATION AND CONSENT: This is a 42 year old patient with known uterine leiomyomata and heavy irregular menses.  Details are dictated in history and physical.  Laparoscopically assisted vaginal hysterectomy with bilateral salpingectomy and removal of an  ovary only if abnormal has been discussed preoperatively.  Potential risks and complications are discussed preoperatively including, but not limited to infection, organ damage, bleeding requiring transfusion of blood products with HIV and hepatitis  acquisition, DVT, PE, pneumonia, laparotomy.  The patient states she understands and agrees and consent is signed on the chart.  FINDINGS:  Upper abdomen is grossly normal.  There are omental adhesions to the anterior abdominal wall just to the left of the level of the umbilicus.  There is no apparent bowel involved and no closed loops were observed.  In the pelvis, the uterus has  an approximately 4 cm pedunculated fibroid on the left fundus as well as a 2 cm pedunculated fibroid on the anterior wall.  Anterior and posterior cul-de-sacs were normal.  Ovaries are normal bilaterally.  DESCRIPTION OF PROCEDURE:  The patient was taken to the operating room.  She was identified, placed in the dorsal supine position and general  anesthesia was induced via endotracheal intubation.  She was placed in the dorsal lithotomy position.  Timeout  was done.  She was prepped abdominally with ChloraPrep, vaginally with Hibiclens.  After a 3-minute drying time a  Hulka tenaculum was placed in the uterus as a manipulator and she was draped in a sterile fashion.  Approximately 10 mL of 0.5% plain  Marcaine was injected subumbilically and suprapubically in the midline.  A small infraumbilical incision was made and a disposable Veress needle was placed on the first attempt without difficulty.  A good syringe and drop test are noted.  Two liters of  gas were insufflated under low pressure with good tympany in the right upper quadrant.  Veress needle was removed and a 10/11 trocar sleeve was placed using direct visualization with the diagnostic scope.  After placement, the operative scope was used.   A small suprapubic incision was made in the midline and a 5 mm disposable trocar is placed under direct visualization without difficulty.  The above findings were noted.  Then, using the EnSeal bipolar cutter cutting instrument, the right fallopian tube  was taken down along the mesosalpinx.  This was carried proximally.  The round ligament and uteroovarian ligament are then taken and it was carried on down to the level of vesicouterine peritoneum.  Similar procedure was carried out on the left.   Vesicouterine peritoneum was taken down bilaterally.  Good hemostasis was noted.  Instruments and suprapubic trocar sleeve were removed and attention was turned to the vagina.  Posterior cul-de-sac was entered sharply and the cervix was circumscribed  with unipolar cautery.  Mucosa was advanced sharply and bluntly.Then, using the LigaSure bipolar cutter cutting instrument, the uterosacral ligaments were taken down followed by the  bladder pillars and the  uterine vessels.  Anterior cul-de-sac was entered.  Further bites were taken to take the superior branches  of the uterine artery.  Fundus was then delivered posteriorly and the specimen was delivered.  All sutures will be 0 Monocryl unless otherwise  designated.  Uterosacral ligaments were plicated bilaterally and then plicated in the midline with a third stitch.  The cuff was closed with figure-of-eights.  Foley catheter was placed and clear urine was noted.  We are turning our attention to the  abdomen pneumoperitoneum was reintroduced and a 5 mm trocar sleeve was reintroduced suprapubically under direct visualization.  Irrigation is carried out.  Minor oozing at the cuff was controlled with bipolar cautery.  Inspection under reduced  pneumoperitoneum reveals good hemostasis.  The remaining 20 mL of 0.5% plain Marcaine is instilled into the peritoneal cavity.  Arista was placed over the cuff as well.  Suprapubic trocar sleeve was removed.  Pneumoperitoneum was reduced and the  umbilical trocar sleeve was taken out.  The umbilical incision was closed with a 0 Vicryl in subcutaneous layer and skin on both incisions was closed with 2-0 Vicryl.  Dermabond was placed.  All counts were correct.  The patient is awakened and taken to  recovery room in stable condition.   PUS D: 01/22/2023 9:57:58 am T: 01/22/2023 10:25:00 am  JOB: [id number]/ [id number]

## 2023-01-23 ENCOUNTER — Encounter (HOSPITAL_BASED_OUTPATIENT_CLINIC_OR_DEPARTMENT_OTHER): Payer: Self-pay | Admitting: Obstetrics and Gynecology

## 2023-01-23 DIAGNOSIS — D259 Leiomyoma of uterus, unspecified: Secondary | ICD-10-CM | POA: Diagnosis not present

## 2023-01-23 LAB — CBC
HCT: 32.2 % — ABNORMAL LOW (ref 36.0–46.0)
Hemoglobin: 10.7 g/dL — ABNORMAL LOW (ref 12.0–15.0)
MCH: 31 pg (ref 26.0–34.0)
MCHC: 33.2 g/dL (ref 30.0–36.0)
MCV: 93.3 fL (ref 80.0–100.0)
Platelets: 232 10*3/uL (ref 150–400)
RBC: 3.45 MIL/uL — ABNORMAL LOW (ref 3.87–5.11)
RDW: 12.5 % (ref 11.5–15.5)
WBC: 11.1 10*3/uL — ABNORMAL HIGH (ref 4.0–10.5)
nRBC: 0 % (ref 0.0–0.2)

## 2023-01-23 LAB — SURGICAL PATHOLOGY

## 2023-01-23 MED ORDER — METHOCARBAMOL 500 MG PO TABS
ORAL_TABLET | ORAL | Status: AC
  Filled 2023-01-23: qty 1

## 2023-01-23 MED ORDER — ACETAMINOPHEN 500 MG PO TABS
ORAL_TABLET | ORAL | Status: AC
  Filled 2023-01-23: qty 2

## 2023-01-23 MED ORDER — IBUPROFEN 200 MG PO TABS
ORAL_TABLET | ORAL | Status: AC
  Filled 2023-01-23: qty 3

## 2023-01-23 MED ORDER — ACETAMINOPHEN 500 MG PO TABS: 1000.0000 mg | ORAL_TABLET | Freq: Four times a day (QID) | ORAL | 0 refills | Status: DC | PRN

## 2023-01-23 MED ORDER — TRAMADOL HCL 50 MG PO TABS
ORAL_TABLET | ORAL | Status: AC
  Filled 2023-01-23: qty 1

## 2023-01-23 MED ORDER — TRAMADOL HCL 50 MG PO TABS: 50.0000 mg | ORAL_TABLET | Freq: Four times a day (QID) | ORAL | 0 refills | Status: DC | PRN

## 2023-01-23 NOTE — Anesthesia Postprocedure Evaluation (Signed)
Anesthesia Post Note  Patient: Lisa [name]  Procedure(s) Performed: LAPAROSCOPIC ASSISTED VAGINAL HYSTERECTOMY WITH SALPINGECTOMY (Bilateral: Vagina )     Patient location during evaluation: PACU Anesthesia Type: General Level of consciousness: awake and alert Pain management: pain level controlled Vital Signs Assessment: post-procedure vital signs reviewed and stable Respiratory status: spontaneous breathing, nonlabored ventilation, respiratory function stable and patient connected to nasal cannula oxygen Cardiovascular status: blood pressure returned to baseline and stable Postop Assessment: no apparent nausea or vomiting Anesthetic complications: no   No notable events documented.               Shelton Silvas

## 2023-01-23 NOTE — Progress Notes (Signed)
Feels much better today Ambulating well, good pain relief  Today's Vitals   01/22/23 2340 01/23/23 0215 01/23/23 0430 01/23/23 0600  BP:  123/69  113/72  Pulse:  70  70  Resp:  12  20  Temp:  98.2 F (36.8 C)  98.3 F (36.8 C)  TempSrc:      SpO2:  97%  97%  Weight:      Height:      PainSc: Asleep 4  Asleep 4    Body mass index is 32.05 kg/m.   Abdomen soft  Results for orders placed or performed during the hospital encounter of 01/22/23 (from the past 24 hour(s))  CBC     Status: Abnormal   Collection Time: 01/23/23 12:14 AM  Result Value Ref Range   WBC 11.1 (H) 4.0 - 10.5 K/uL   RBC 3.45 (L) 3.87 - 5.11 MIL/uL   Hemoglobin 10.7 (L) 12.0 - 15.0 g/dL   HCT 59.5 (L) 63.8 - 75.6 %   MCV 93.3 80.0 - 100.0 fL   MCH 31.0 26.0 - 34.0 pg   MCHC 33.2 30.0 - 36.0 g/dL   RDW 43.3 29.5 - 18.8 %   Platelets 232 150 - 400 K/uL   nRBC 0.0 0.0 - 0.2 %    A/P: D/C home         Tramadol 50mg  prn         FU office 1-2 weeks         She is scheduled for another iron infusion 05/30/22

## 2023-01-23 NOTE — Discharge Summary (Signed)
Physician Discharge Summary  Patient ID: Lisa [name] MRN: [id number] DOB/AGE: 40-02-1981 40 y.o.  Admit date: 01/22/2023 Discharge date: 01/23/2023  Admission Diagnoses: abnormal uterine bleeding  Discharge Diagnoses:  Principal Problem:   Abnormal uterine bleeding   Discharged Condition: good  Hospital Course: taken to OR for LAVH/BS. Postoperatively had good resumption of bowel/bladder function and good pain control. BP was initially 90s/60s with normal pulse. IV fluids were continued overnight and on day of discharge feels good. Ambulating without difficulty.  Consults: None  Significant Diagnostic Studies: labs:  Results for orders placed or performed during the hospital encounter of 01/22/23 (from the past 24 hour(s))  CBC     Status: Abnormal   Collection Time: 01/23/23 12:14 AM  Result Value Ref Range   WBC 11.1 (H) 4.0 - 10.5 K/uL   RBC 3.45 (L) 3.87 - 5.11 MIL/uL   Hemoglobin 10.7 (L) 12.0 - 15.0 g/dL   HCT 40.9 (L) 81.1 - 91.4 %   MCV 93.3 80.0 - 100.0 fL   MCH 31.0 26.0 - 34.0 pg   MCHC 33.2 30.0 - 36.0 g/dL   RDW 78.2 95.6 - 21.3 %   Platelets 232 150 - 400 K/uL   nRBC 0.0 0.0 - 0.2 %     Treatments: surgery: laparoscopically assisted vaginal hysterectomy with bilateral salpingectomy  Discharge Exam: Blood pressure 113/72, pulse 70, temperature 98.3 F (36.8 C), resp. rate 20, height 5\' 9"  (1.753 m), weight 98.4 kg, last menstrual period 01/22/2023, SpO2 97%. General appearance: alert, cooperative, and no distress GI: soft, good BX  Disposition: Discharge disposition: 01-Home or Self Care        Allergies as of 01/23/2023       Reactions   Sulfa Antibiotics Anaphylaxis   Venofer [iron Sucrose] Hives, Shortness Of Breath, Other (See Comments)   Also complained of knee, hand, and finger stiffness. Reaction occurred during post-infusion monitoring period. Required IV diphenhydramine. See progress note from 08/07/2022.   Darvocet [propoxyphene  N-acetaminophen] Other (See Comments)   Hallucinations    Imitrex [sumatriptan Base] Other (See Comments)   Lock-jaw   Keflex [cephalexin] Rash   Childhood allergy   Penicillins Rash   Childhood allergy        Medication List     STOP taking these medications    ibuprofen 200 MG tablet Commonly known as: ADVIL       TAKE these medications    acetaminophen 500 MG tablet Commonly known as: TYLENOL Take 2 tablets (1,000 mg total) by mouth every 6 (six) hours as needed.   baclofen 20 MG tablet Commonly known as: LIORESAL Take 20 mg by mouth at bedtime as needed for muscle spasms.   citalopram 40 MG tablet Commonly known as: CELEXA Take 1 tablet (40 mg total) by mouth daily. What changed: how much to take   Ferrlecit 12.5 MG/ML injection Generic drug: ferric gluconate Every 2 weeks   levothyroxine 100 MCG tablet Commonly known as: SYNTHROID Take 1 tablet (100 mcg total) by mouth daily before breakfast.   OCREVUS IV Inject 600 mg into the vein every 6 (six) months.   rOPINIRole 0.25 MG tablet Commonly known as: REQUIP Take 0.5 mg by mouth at bedtime as needed (muscle spasms).   traMADol 50 MG tablet Commonly known as: ULTRAM Take 1 tablet (50 mg total) by mouth every 6 (six) hours as needed for severe pain.   VITAMIN B-12 PO Take 1 tablet by mouth daily.   VITAMIN D-3 PO Take 1  capsule by mouth daily.         Signed: Roselle Locus II 01/23/2023, 7:40 AM

## 2023-01-28 ENCOUNTER — Inpatient Hospital Stay: Payer: 59

## 2023-01-28 VITALS — BP 116/77 | HR 77 | Temp 98.0°F | Resp 19 | Wt 217.9 lb

## 2023-01-28 DIAGNOSIS — D509 Iron deficiency anemia, unspecified: Secondary | ICD-10-CM

## 2023-01-28 DIAGNOSIS — D500 Iron deficiency anemia secondary to blood loss (chronic): Secondary | ICD-10-CM | POA: Diagnosis not present

## 2023-01-28 MED ORDER — METHYLPREDNISOLONE SODIUM SUCC 125 MG IJ SOLR
125.0000 mg | Freq: Once | INTRAMUSCULAR | Status: AC
  Administered 2023-01-28: 125 mg via INTRAVENOUS
  Filled 2023-01-28: qty 2

## 2023-01-28 MED ORDER — SODIUM CHLORIDE 0.9 % IV SOLN: Freq: Once | INTRAVENOUS | Status: AC

## 2023-01-28 MED ORDER — SODIUM CHLORIDE 0.9 % IV SOLN
125.0000 mg | Freq: Once | INTRAVENOUS | Status: AC
  Administered 2023-01-28: 125 mg via INTRAVENOUS
  Filled 2023-01-28: qty 10

## 2023-01-28 MED ORDER — FAMOTIDINE IN NACL 20-0.9 MG/50ML-% IV SOLN
20.0000 mg | Freq: Once | INTRAVENOUS | Status: AC
  Administered 2023-01-28: 20 mg via INTRAVENOUS
  Filled 2023-01-28: qty 50

## 2023-01-28 MED ORDER — ACETAMINOPHEN 325 MG PO TABS
650.0000 mg | ORAL_TABLET | Freq: Once | ORAL | Status: AC
  Administered 2023-01-28: 650 mg via ORAL
  Filled 2023-01-28: qty 2

## 2023-01-28 MED ORDER — CETIRIZINE HCL 10 MG/ML IV SOLN
10.0000 mg | Freq: Once | INTRAVENOUS | Status: AC
  Administered 2023-01-28: 10 mg via INTRAVENOUS
  Filled 2023-01-28: qty 1

## 2023-01-28 NOTE — Patient Instructions (Addendum)
[facility]  Discharge Instructions: Thank you for choosing [facility] to provide your oncology and hematology care.  If you have a lab appointment with the Cancer Center - please note that after April 8th, 2024, all labs will be drawn in the cancer center.  You do not have to check in or register with the main entrance as you have in the past but will complete your check-in in the cancer center.  Wear comfortable clothing and clothing appropriate for easy access to any Portacath or PICC line.   We strive to give you quality time with your provider. You may need to reschedule your appointment if you arrive late (15 or more minutes).  Arriving late affects you and other patients whose appointments are after yours.  Also, if you miss three or more appointments without notifying the office, you may be dismissed from the clinic at the provider's discretion.      For prescription refill requests, have your pharmacy contact our office and allow 72 hours for refills to be completed.    Today you received the following:  Ferlecit.  Sodium Ferric Gluconate Complex Injection What is this medication? SODIUM FERRIC GLUCONATE COMPLEX (SOE dee um FER ik GLOO koe nate KOM pleks) treats low levels of iron (iron deficiency anemia) in people with kidney disease. Iron is a mineral that plays an important role in making red blood cells, which carry oxygen from your lungs to the rest of your body. This medicine may be used for other purposes; ask your health care provider or pharmacist if you have questions. COMMON BRAND NAME(S): Ferrlecit, Nulecit What should I tell my care team before I take this medication? They need to know if you have any of the following conditions: Anemia that is not from iron deficiency High levels of iron in the blood An unusual or allergic reaction to iron, other medications, foods, dyes, or preservatives Pregnant or are trying to become  pregnant Breast-feeding How should I use this medication? This medication is injected into a vein. It is given by your care team in a hospital or clinic setting. Talk to your care team about the use of this medication in children. While it may be prescribed for children as young as 6 years for selected conditions, precautions do apply. Overdosage: If you think you have taken too much of this medicine contact a poison control center or emergency room at once. NOTE: This medicine is only for you. Do not share this medicine with others. What if I miss a dose? It is important not to miss your dose. Call your care team if you are unable to keep an appointment. What may interact with this medication? Do not take this medication with any of the following: Deferasirox Deferoxamine Dimercaprol This medication may also interact with the following: Other iron products This list may not describe all possible interactions. Give your health care provider a list of all the medicines, herbs, non-prescription drugs, or dietary supplements you use. Also tell them if you smoke, drink alcohol, or use illegal drugs. Some items may interact with your medicine. What should I watch for while using this medication? Your condition will be monitored carefully while you are receiving this medication. Visit your care team for regular checks on your progress. You may need blood work while you are taking this medication. What side effects may I notice from receiving this medication? Side effects that you should report to your care team as soon  as possible: Allergic reactions--skin rash, itching, hives, swelling of the face, lips, tongue, or throat Low blood pressure--dizziness, feeling faint or lightheaded, blurry vision Shortness of breath Side effects that usually do not require medical attention (report to your care team if they continue or are bothersome): Flushing Headache Joint pain Muscle pain Nausea Pain,  redness, or irritation at injection site This list may not describe all possible side effects. Call your doctor for medical advice about side effects. You may report side effects to FDA at 1-800-FDA-1088. Where should I keep my medication? This medication is given in a hospital or clinic and will not be stored at home. NOTE: This sheet is a summary. It may not cover all possible information. If you have questions about this medicine, talk to your doctor, pharmacist, or health care provider.  2024 Elsevier/Gold Standard (2020-08-26 00:00:00)     To help prevent nausea and vomiting after your treatment, we encourage you to take your nausea medication as directed.  BELOW ARE SYMPTOMS THAT SHOULD BE REPORTED IMMEDIATELY: *FEVER GREATER THAN 100.4 F (38 C) OR HIGHER *CHILLS OR SWEATING *NAUSEA AND VOMITING THAT IS NOT CONTROLLED WITH YOUR NAUSEA MEDICATION *UNUSUAL SHORTNESS OF BREATH *UNUSUAL BRUISING OR BLEEDING *URINARY PROBLEMS (pain or burning when urinating, or frequent urination) *BOWEL PROBLEMS (unusual diarrhea, constipation, pain near the anus) TENDERNESS IN MOUTH AND THROAT WITH OR WITHOUT PRESENCE OF ULCERS (sore throat, sores in mouth, or a toothache) UNUSUAL RASH, SWELLING OR PAIN  UNUSUAL VAGINAL DISCHARGE OR ITCHING   Items with * indicate a potential emergency and should be followed up as soon as possible or go to the Emergency Department if any problems should occur.  Please show the CHEMOTHERAPY ALERT CARD or IMMUNOTHERAPY ALERT CARD at check-in to the Emergency Department and triage nurse.  Should you have questions after your visit or need to cancel or reschedule your appointment, please contact [facility] [phone]  and follow the prompts.  Office hours are 8:00 a.m. to 4:30 p.m. Monday - Friday. Please note that voicemails left after 4:00 p.m. may not be returned until the following business day.  We are closed weekends and major holidays. You  have access to a nurse at all times for urgent questions. Please call the main number to the clinic [phone] and follow the prompts.  For any non-urgent questions, you may also contact your provider using MyChart. We now offer e-Visits for anyone 18 and older to request care online for non-urgent symptoms. For details visit [email or web address].   Also download the MyChart app! Go to the app store, search "MyChart", open the app, select [facility], and log in with your MyChart username and password.

## 2023-01-28 NOTE — Progress Notes (Signed)
Patient presents today for iron infusion.  Patient is in satisfactory condition with no new complaints voiced.  Vital signs are stable.  IV placed in L hand.  IV flushed well with good blood return noted.  Patient is 6 days s/p hysterectomy.  Dr. Ellin Saba aware.  We will proceed with infusion per provider orders.    Patient tolerated treatment well with no complaints voiced.  Patient left ambulatory with daughter in stable condition.  Vital signs stable at discharge.  Follow up as scheduled.

## 2023-02-07 ENCOUNTER — Other Ambulatory Visit: Payer: Self-pay

## 2023-02-07 ENCOUNTER — Emergency Department (HOSPITAL_COMMUNITY)
Admission: EM | Admit: 2023-02-07 | Discharge: 2023-02-08 | Disposition: A | Discharge status: Home / Self Care | Payer: 59 | Attending: Emergency Medicine | Admitting: Emergency Medicine

## 2023-02-07 ENCOUNTER — Encounter (HOSPITAL_COMMUNITY): Payer: Self-pay

## 2023-02-07 ENCOUNTER — Emergency Department (HOSPITAL_COMMUNITY): Payer: 59

## 2023-02-07 DIAGNOSIS — G8918 Other acute postprocedural pain: Secondary | ICD-10-CM | POA: Insufficient documentation

## 2023-02-07 DIAGNOSIS — R110 Nausea: Secondary | ICD-10-CM | POA: Diagnosis not present

## 2023-02-07 DIAGNOSIS — R1010 Upper abdominal pain, unspecified: Secondary | ICD-10-CM | POA: Diagnosis not present

## 2023-02-07 LAB — CBC
HCT: 35.8 % — ABNORMAL LOW (ref 36.0–46.0)
Hemoglobin: 11.8 g/dL — ABNORMAL LOW (ref 12.0–15.0)
MCH: 30.4 pg (ref 26.0–34.0)
MCHC: 33 g/dL (ref 30.0–36.0)
MCV: 92.3 fL (ref 80.0–100.0)
Platelets: 228 10*3/uL (ref 150–400)
RBC: 3.88 MIL/uL (ref 3.87–5.11)
RDW: 12.6 % (ref 11.5–15.5)
WBC: 12.4 10*3/uL — ABNORMAL HIGH (ref 4.0–10.5)
nRBC: 0 % (ref 0.0–0.2)

## 2023-02-07 LAB — COMPREHENSIVE METABOLIC PANEL
ALT: 30 U/L (ref 0–44)
AST: 20 U/L (ref 15–41)
Albumin: 3.9 g/dL (ref 3.5–5.0)
Alkaline Phosphatase: 54 U/L (ref 38–126)
Anion gap: 7 (ref 5–15)
BUN: 13 mg/dL (ref 6–20)
CO2: 26 mmol/L (ref 22–32)
Calcium: 8.6 mg/dL — ABNORMAL LOW (ref 8.9–10.3)
Chloride: 104 mmol/L (ref 98–111)
Creatinine, Ser: 0.52 mg/dL (ref 0.44–1.00)
GFR, Estimated: >60 mL/min (ref >60)
Glucose, Bld: 117 mg/dL — ABNORMAL HIGH (ref 70–99)
Potassium: 3.7 mmol/L (ref 3.5–5.1)
Sodium: 137 mmol/L (ref 135–145)
Total Bilirubin: 0.7 mg/dL (ref 0.3–1.2)
Total Protein: 6.5 g/dL (ref 6.5–8.1)

## 2023-02-07 LAB — URINALYSIS, ROUTINE W REFLEX MICROSCOPIC
Bilirubin Urine: NEGATIVE
Glucose, UA: NEGATIVE mg/dL
Ketones, ur: NEGATIVE mg/dL
Nitrite: NEGATIVE
Protein, ur: 100 mg/dL — AB
RBC / HPF: >50 RBC/hpf (ref 0–5)
Specific Gravity, Urine: 1.014 (ref 1.005–1.030)
WBC, UA: >50 WBC/hpf (ref 0–5)
pH: 6 (ref 5.0–8.0)

## 2023-02-07 LAB — LIPASE, BLOOD: Lipase: 28 U/L (ref 11–51)

## 2023-02-07 LAB — LACTIC ACID, PLASMA: Lactic Acid, Venous: 1.5 mmol/L (ref 0.5–1.9)

## 2023-02-07 MED ORDER — IOHEXOL 300 MG/ML  SOLN
100.0000 mL | Freq: Once | INTRAMUSCULAR | Status: AC | PRN
Start: 2023-02-07 — End: 2023-02-07
  Administered 2023-02-07: 100 mL via INTRAVENOUS

## 2023-02-07 MED FILL — Sod Ferric Gluc Cmplx in Sucrose IV Soln 12.5 MG/ML (Fe Eq): INTRAVENOUS | Qty: 10 | Status: AC

## 2023-02-07 NOTE — ED Notes (Signed)
Pt ambulated independently to restroom with normal gait

## 2023-02-07 NOTE — ED Triage Notes (Signed)
Hysterectomy 10/8 Monday started having bleeding after ocrevus transfusion for MS Pt seen surgeon today  Surgeon used silver nitrate, bleeding stopped  Pt complains of increased ABD pain and chills.

## 2023-02-07 NOTE — ED Notes (Signed)
Patient transported to CT 

## 2023-02-07 NOTE — ED Notes (Addendum)
Pt states she feels like she has been getting more bloated as the day has gone on since seeing provider.  Reports a feeling of fullness, pressure in abdomen that she hasn't felt until today.

## 2023-02-08 ENCOUNTER — Inpatient Hospital Stay: Payer: 59

## 2023-02-08 DIAGNOSIS — G8918 Other acute postprocedural pain: Secondary | ICD-10-CM | POA: Diagnosis not present

## 2023-02-08 LAB — LACTIC ACID, PLASMA: Lactic Acid, Venous: 0.9 mmol/L (ref 0.5–1.9)

## 2023-02-08 NOTE — ED Provider Notes (Signed)
[place] EMERGENCY DEPARTMENT AT [facility] Provider Note   CSN: [id number] Arrival date & time: 02/07/23  2206     History  Chief Complaint  Patient presents with   Abdominal Pain   Post-op Problem    Lisa [name] is a 42 y.o. female.  Patient underwent laparoscopic hysterectomy on October 8.  She reports she was doing well postoperatively but today started having sensation of feeling bloated with some upper abdominal cramping pain.  Mild nausea but no vomiting.       Home Medications Prior to Admission medications   Medication Sig Start Date End Date Taking? Authorizing Provider  acetaminophen (TYLENOL) 500 MG tablet Take 2 tablets (1,000 mg total) by mouth every 6 (six) hours as needed. 01/23/23   Harold Hedge, MD  baclofen (LIORESAL) 20 MG tablet Take 20 mg by mouth at bedtime as needed for muscle spasms.    [provider]  Cholecalciferol (VITAMIN D-3 PO) Take 1 capsule by mouth daily.    [provider]  citalopram (CELEXA) 40 MG tablet Take 1 tablet (40 mg total) by mouth daily. Patient taking differently: Take 20 mg by mouth daily. 06/18/22   Babs Sciara, MD  Cyanocobalamin (VITAMIN B-12 PO) Take 1 tablet by mouth daily.    [provider]  ferric gluconate (FERRLECIT) 12.5 MG/ML injection Every 2 weeks    [provider]  levothyroxine (SYNTHROID) 100 MCG tablet Take 1 tablet (100 mcg total) by mouth daily before breakfast. 06/18/22   Luking, Jonna Coup, MD  Ocrelizumab (OCREVUS IV) Inject 600 mg into the vein every 6 (six) months.    [provider]  rOPINIRole (REQUIP) 0.25 MG tablet Take 0.5 mg by mouth at bedtime as needed (muscle spasms).    [provider]  traMADol (ULTRAM) 50 MG tablet Take 1 tablet (50 mg total) by mouth every 6 (six) hours as needed for severe pain. 01/23/23   Harold Hedge, MD      Allergies    Sulfa antibiotics, Venofer [iron sucrose], Darvocet [propoxyphene  n-acetaminophen], Imitrex [sumatriptan base], Keflex [cephalexin], and Penicillins    Review of Systems   Review of Systems  Physical Exam Updated Vital Signs BP 121/71   Pulse 87   Temp 98.8 F (37.1 C) (Oral)   Resp 17   Ht 5\' 9"  (1.753 m)   Wt 90.7 kg   LMP 01/22/2023 (Exact Date)   SpO2 99%   BMI 29.53 kg/m  Physical Exam Vitals and nursing note reviewed.  Constitutional:      General: She is not in acute distress.    Appearance: She is well-developed.  HENT:     Head: Normocephalic and atraumatic.     Mouth/Throat:     Mouth: Mucous membranes are moist.  Eyes:     General: Vision grossly intact. Gaze aligned appropriately.     Extraocular Movements: Extraocular movements intact.     Conjunctiva/sclera: Conjunctivae normal.  Cardiovascular:     Rate and Rhythm: Normal rate and regular rhythm.     Pulses: Normal pulses.     Heart sounds: Normal heart sounds, S1 normal and S2 normal. No murmur heard.    No friction rub. No gallop.  Pulmonary:     Effort: Pulmonary effort is normal. No respiratory distress.     Breath sounds: Normal breath sounds.  Abdominal:     General: Bowel sounds are normal.     Palpations: Abdomen is soft.     Tenderness:  There is generalized abdominal tenderness. There is no guarding or rebound.     Hernia: No hernia is present.  Musculoskeletal:        General: No swelling.     Cervical back: Full passive range of motion without pain, normal range of motion and neck supple. No spinous process tenderness or muscular tenderness. Normal range of motion.     Right lower leg: No edema.     Left lower leg: No edema.  Skin:    General: Skin is warm and dry.     Capillary Refill: Capillary refill takes less than 2 seconds.     Findings: No ecchymosis, erythema, rash or wound.  Neurological:     General: No focal deficit present.     Mental Status: She is alert and oriented to person, place, and time.     GCS: GCS eye subscore is 4. GCS verbal  subscore is 5. GCS motor subscore is 6.     Cranial Nerves: Cranial nerves 2-12 are intact.     Sensory: Sensation is intact.     Motor: Motor function is intact.     Coordination: Coordination is intact.  Psychiatric:        Attention and Perception: Attention normal.        Mood and Affect: Mood normal.        Speech: Speech normal.        Behavior: Behavior normal.     ED Results / Procedures / Treatments   Labs (all labs ordered are listed, but only abnormal results are displayed) Labs Reviewed  COMPREHENSIVE METABOLIC PANEL - Abnormal; Notable for the following components:      Result Value   Glucose, Bld 117 (*)    Calcium 8.6 (*)    All other components within normal limits  CBC - Abnormal; Notable for the following components:   WBC 12.4 (*)    Hemoglobin 11.8 (*)    HCT 35.8 (*)    All other components within normal limits  URINALYSIS, ROUTINE W REFLEX MICROSCOPIC - Abnormal; Notable for the following components:   APPearance CLOUDY (*)    Hgb urine dipstick LARGE (*)    Protein, ur 100 (*)    Leukocytes,Ua MODERATE (*)    Bacteria, UA RARE (*)    All other components within normal limits  LIPASE, BLOOD  LACTIC ACID, PLASMA  LACTIC ACID, PLASMA    EKG EKG Interpretation Date/Time:  Thursday February 07 2023 22:51:58 EDT Ventricular Rate:  90 PR Interval:  137 QRS Duration:  97 QT Interval:  361 QTC Calculation: 442 R Axis:   82  Text Interpretation: Sinus rhythm Low voltage, precordial leads No significant change since prior 3/13 Confirmed by Meridee Score [phone]) on 02/07/2023 10:54:05 PM  Radiology CT ABDOMEN PELVIS W CONTRAST  Result Date: 02/08/2023 CLINICAL DATA:  Postoperative abdominal pain. EXAM: CT ABDOMEN AND PELVIS WITH CONTRAST TECHNIQUE: Multidetector CT imaging of the abdomen and pelvis was performed using the standard protocol following bolus administration of intravenous contrast. RADIATION DOSE REDUCTION: This exam was performed  according to the departmental dose-optimization program which includes automated exposure control, adjustment of the mA and/or kV according to patient size and/or use of iterative reconstruction technique. CONTRAST:  OMNIPAQUE IOHEXOL 300 MG/ML  SOLN COMPARISON:  CT abdomen and pelvis 08/12/2022. FINDINGS: Lower chest: No acute abnormality. Hepatobiliary: No focal liver abnormality is seen. No gallstones, gallbladder wall thickening, or biliary dilatation. Pancreas: Unremarkable. No pancreatic ductal dilatation or surrounding inflammatory  changes. Spleen: Normal in size without focal abnormality. Adrenals/Urinary Tract: There is a rounded hypodensity in the left kidney which is too small to characterize and unchanged, most likely a cyst. Otherwise, the kidneys, adrenal glands and bladder are within normal limits. Stomach/Bowel: Stomach is within normal limits. Appendix is not seen. No evidence of bowel wall thickening, distention, or inflammatory changes. Vascular/Lymphatic: No significant vascular findings are present. No enlarged abdominal or pelvic lymph nodes. Reproductive: Patient is status post interval hysterectomy. There is a small amount of fluid, stranding and air adjacent to the vaginal cuff. There is no drainable fluid collection identified. The ovaries are within normal limits. Other: No abdominal wall hernia or abnormality. No abdominopelvic ascites. Musculoskeletal: No acute or significant osseous findings. IMPRESSION: Status post interval hysterectomy. Small amount of fluid, stranding and air adjacent to the vaginal cuff. No drainable fluid collection identified. Electronically Signed   By: Darliss Cheney M.D.   On: 02/08/2023 00:21    Procedures Procedures    Medications Ordered in ED Medications  iohexol (OMNIPAQUE) 300 MG/ML solution 100 mL (100 mLs Intravenous Contrast Given 02/07/23 2358)    ED Course/ Medical Decision Making/ A&P                                 Medical  Decision Making Amount and/or Complexity of Data Reviewed Labs: ordered. Radiology: ordered.  Risk Prescription drug management.   Differential Diagnosis considered includes, but not limited to: Cholelithiasis; cholecystitis; cholangitis; bowel obstruction; esophagitis; gastritis; peptic ulcer disease; pancreatitis; cardiac.  Presents with abdominal cramping, mild nausea, sensation of bloating.  She underwent hysterectomy on October 8, had a normal postoperative recovery until discomfort began.  Symptoms worsened over the course of today.  Patient does report that she had Ocrevus transfusion for her MS this week and always gets achy and feels bad after the infusion.  No fever.  Abdominal exam is benign.  She has good bowel sounds, no focal tenderness or signs of peritonitis.  Lab work reassuring, only abnormality is slight leukocytosis.  Patient underwent CT scan to further evaluate.  Postoperative findings are present but no fluid collections, abscess, bowel obstruction or other findings to explain her symptoms.  Patient reassured.        Final Clinical Impression(s) / ED Diagnoses Final diagnoses:  Post-operative pain    Rx / DC Orders ED Discharge Orders     None         Andrew Soria, Canary Brim, MD 02/08/23 0028

## 2023-02-08 NOTE — ED Notes (Signed)
Pt returned to treatment room, stretcher locked in lowest position, call bell in reach, family at bedside. No needs expressed at this time.

## 2023-02-15 ENCOUNTER — Inpatient Hospital Stay: Payer: 59 | Attending: Hematology

## 2023-02-15 VITALS — BP 113/83 | HR 57 | Temp 98.2°F | Resp 16

## 2023-02-15 DIAGNOSIS — D500 Iron deficiency anemia secondary to blood loss (chronic): Secondary | ICD-10-CM | POA: Diagnosis present

## 2023-02-15 DIAGNOSIS — N920 Excessive and frequent menstruation with regular cycle: Secondary | ICD-10-CM | POA: Diagnosis not present

## 2023-02-15 DIAGNOSIS — D509 Iron deficiency anemia, unspecified: Secondary | ICD-10-CM

## 2023-02-15 MED ORDER — FAMOTIDINE IN NACL 20-0.9 MG/50ML-% IV SOLN
20.0000 mg | Freq: Once | INTRAVENOUS | Status: AC
  Administered 2023-02-15: 20 mg via INTRAVENOUS
  Filled 2023-02-15: qty 50

## 2023-02-15 MED ORDER — ACETAMINOPHEN 325 MG PO TABS
650.0000 mg | ORAL_TABLET | Freq: Once | ORAL | Status: AC
Start: 2023-02-15 — End: 2023-02-15
  Administered 2023-02-15: 650 mg via ORAL
  Filled 2023-02-15: qty 2

## 2023-02-15 MED ORDER — SODIUM CHLORIDE 0.9 % IV SOLN
125.0000 mg | Freq: Once | INTRAVENOUS | Status: DC
  Filled 2023-02-15: qty 10

## 2023-02-15 MED ORDER — SODIUM CHLORIDE 0.9 % IV SOLN
125.0000 mg | Freq: Once | INTRAVENOUS | Status: AC
  Administered 2023-02-15: 125 mg via INTRAVENOUS
  Filled 2023-02-15: qty 10

## 2023-02-15 MED ORDER — METHYLPREDNISOLONE SODIUM SUCC 125 MG IJ SOLR
125.0000 mg | Freq: Once | INTRAMUSCULAR | Status: AC
  Administered 2023-02-15: 125 mg via INTRAVENOUS
  Filled 2023-02-15: qty 2

## 2023-02-15 MED ORDER — CETIRIZINE HCL 10 MG/ML IV SOLN
10.0000 mg | Freq: Once | INTRAVENOUS | Status: AC
Start: 2023-02-15 — End: 2023-02-15
  Administered 2023-02-15: 10 mg via INTRAVENOUS
  Filled 2023-02-15: qty 1

## 2023-02-15 MED ORDER — SODIUM CHLORIDE 0.9 % IV SOLN: Freq: Once | INTRAVENOUS | Status: AC

## 2023-02-15 NOTE — Progress Notes (Signed)
Patient presents today for Ferrlecit iron infusion.  Ferrlecit given today per MD orders. Tolerated infusion without adverse affects. Vital signs stable. No complaints at this time. Discharged from clinic ambulatory in stable condition. Alert and oriented x 3. F/U with [facility] as scheduled.

## 2023-02-22 ENCOUNTER — Inpatient Hospital Stay: Payer: 59

## 2023-02-22 VITALS — BP 115/78 | HR 67 | Temp 96.0°F | Resp 19

## 2023-02-22 DIAGNOSIS — D500 Iron deficiency anemia secondary to blood loss (chronic): Secondary | ICD-10-CM | POA: Diagnosis not present

## 2023-02-22 DIAGNOSIS — D509 Iron deficiency anemia, unspecified: Secondary | ICD-10-CM

## 2023-02-22 MED ORDER — CETIRIZINE HCL 10 MG/ML IV SOLN
10.0000 mg | Freq: Once | INTRAVENOUS | Status: AC
  Administered 2023-02-22: 10 mg via INTRAVENOUS
  Filled 2023-02-22: qty 1

## 2023-02-22 MED ORDER — SODIUM CHLORIDE 0.9 % IV SOLN
125.0000 mg | Freq: Once | INTRAVENOUS | Status: AC
  Administered 2023-02-22: 125 mg via INTRAVENOUS
  Filled 2023-02-22: qty 125

## 2023-02-22 MED ORDER — ACETAMINOPHEN 325 MG PO TABS
650.0000 mg | ORAL_TABLET | Freq: Once | ORAL | Status: AC
Start: 2023-02-22 — End: 2023-02-22
  Administered 2023-02-22: 650 mg via ORAL
  Filled 2023-02-22: qty 2

## 2023-02-22 MED ORDER — SODIUM CHLORIDE 0.9 % IV SOLN: Freq: Once | INTRAVENOUS | Status: AC

## 2023-02-22 MED ORDER — FAMOTIDINE IN NACL 20-0.9 MG/50ML-% IV SOLN
20.0000 mg | Freq: Once | INTRAVENOUS | Status: AC
  Administered 2023-02-22: 20 mg via INTRAVENOUS
  Filled 2023-02-22: qty 50

## 2023-02-22 MED ORDER — METHYLPREDNISOLONE SODIUM SUCC 125 MG IJ SOLR
125.0000 mg | Freq: Once | INTRAMUSCULAR | Status: AC
Start: 2023-02-22 — End: 2023-02-22
  Administered 2023-02-22: 125 mg via INTRAVENOUS
  Filled 2023-02-22: qty 2

## 2023-02-22 NOTE — Progress Notes (Signed)
Patient tolerated iron infusion with no complaints voiced.  Peripheral IV site clean and dry with good blood return noted before and after infusion.  Band aid applied.  Pt observed for 30 minutes post iron infusion with no complications occurring. VSS with discharge and left in satisfactory condition with no s/s of distress noted. All follow ups as scheduled.  Lisa [name] [employer]

## 2023-02-22 NOTE — Patient Instructions (Signed)
[facility] [place] [facility] - A DEPT OF [facility]  Discharge Instructions: Thank you for choosing [facility] to provide your oncology and hematology care.  If you have a lab appointment with the Cancer Center - please note that after April 8th, 2024, all labs will be drawn in the cancer center.  You do not have to check in or register with the main entrance as you have in the past but will complete your check-in in the cancer center.  Wear comfortable clothing and clothing appropriate for easy access to any Portacath or PICC line.   We strive to give you quality time with your provider. You may need to reschedule your appointment if you arrive late (15 or more minutes).  Arriving late affects you and other patients whose appointments are after yours.  Also, if you miss three or more appointments without notifying the office, you may be dismissed from the clinic at the provider's discretion.      For prescription refill requests, have your pharmacy contact our office and allow 72 hours for refills to be completed.    Today you received the following chemotherapy and/or immunotherapy agents Ferlecit   To help prevent nausea and vomiting after your treatment, we encourage you to take your nausea medication as directed.  BELOW ARE SYMPTOMS THAT SHOULD BE REPORTED IMMEDIATELY: *FEVER GREATER THAN 100.4 F (38 C) OR HIGHER *CHILLS OR SWEATING *NAUSEA AND VOMITING THAT IS NOT CONTROLLED WITH YOUR NAUSEA MEDICATION *UNUSUAL SHORTNESS OF BREATH *UNUSUAL BRUISING OR BLEEDING *URINARY PROBLEMS (pain or burning when urinating, or frequent urination) *BOWEL PROBLEMS (unusual diarrhea, constipation, pain near the anus) TENDERNESS IN MOUTH AND THROAT WITH OR WITHOUT PRESENCE OF ULCERS (sore throat, sores in mouth, or a toothache) UNUSUAL RASH, SWELLING OR PAIN  UNUSUAL VAGINAL DISCHARGE OR ITCHING   Items with * indicate a potential emergency and should be followed up as  soon as possible or go to the Emergency Department if any problems should occur.  Please show the CHEMOTHERAPY ALERT CARD or IMMUNOTHERAPY ALERT CARD at check-in to the Emergency Department and triage nurse.  Should you have questions after your visit or need to cancel or reschedule your appointment, please contact [place] [facility] - A DEPT OF [facility] [phone]  and follow the prompts.  Office hours are 8:00 a.m. to 4:30 p.m. Monday - Friday. Please note that voicemails left after 4:00 p.m. may not be returned until the following business day.  We are closed weekends and major holidays. You have access to a nurse at all times for urgent questions. Please call the main number to the clinic [phone] and follow the prompts.  For any non-urgent questions, you may also contact your provider using MyChart. We now offer e-Visits for anyone 75 and older to request care online for non-urgent symptoms. For details visit [email or web address].   Also download the MyChart app! Go to the app store, search "MyChart", open the app, select [place], and log in with your MyChart username and password.

## 2023-05-22 ENCOUNTER — Other Ambulatory Visit: Payer: Self-pay

## 2023-05-22 ENCOUNTER — Encounter: Payer: Self-pay | Admitting: Family Medicine

## 2023-05-22 ENCOUNTER — Telehealth: Payer: Self-pay | Admitting: Family Medicine

## 2023-05-22 MED ORDER — LEVOTHYROXINE SODIUM 100 MCG PO TABS: 100.0000 ug | ORAL_TABLET | Freq: Every day | ORAL | 0 refills | Status: DC

## 2023-05-22 NOTE — Telephone Encounter (Signed)
 Nurses Please order lipid, metabolic 7, vitamin D , TSH and free T4 through [facility]  Hypothyroidism, lipid screening, hypocalcemia

## 2023-05-23 ENCOUNTER — Other Ambulatory Visit: Payer: Self-pay

## 2023-05-23 DIAGNOSIS — D509 Iron deficiency anemia, unspecified: Secondary | ICD-10-CM

## 2023-05-23 DIAGNOSIS — D508 Other iron deficiency anemias: Secondary | ICD-10-CM

## 2023-05-23 DIAGNOSIS — G35 Multiple sclerosis: Secondary | ICD-10-CM

## 2023-05-23 DIAGNOSIS — R7989 Other specified abnormal findings of blood chemistry: Secondary | ICD-10-CM

## 2023-05-23 DIAGNOSIS — E890 Postprocedural hypothyroidism: Secondary | ICD-10-CM

## 2023-05-23 DIAGNOSIS — E785 Hyperlipidemia, unspecified: Secondary | ICD-10-CM

## 2023-05-24 ENCOUNTER — Other Ambulatory Visit: Payer: Self-pay

## 2023-05-24 DIAGNOSIS — Z13220 Encounter for screening for lipoid disorders: Secondary | ICD-10-CM

## 2023-05-24 DIAGNOSIS — E785 Hyperlipidemia, unspecified: Secondary | ICD-10-CM

## 2023-05-24 DIAGNOSIS — E890 Postprocedural hypothyroidism: Secondary | ICD-10-CM

## 2023-07-03 ENCOUNTER — Other Ambulatory Visit: Payer: Self-pay

## 2023-07-03 DIAGNOSIS — D509 Iron deficiency anemia, unspecified: Secondary | ICD-10-CM

## 2023-07-04 ENCOUNTER — Inpatient Hospital Stay: Payer: 59 | Attending: Hematology

## 2023-07-04 DIAGNOSIS — Z820 Family history of epilepsy and other diseases of the nervous system: Secondary | ICD-10-CM | POA: Insufficient documentation

## 2023-07-04 DIAGNOSIS — Z87891 Personal history of nicotine dependence: Secondary | ICD-10-CM | POA: Insufficient documentation

## 2023-07-04 DIAGNOSIS — D500 Iron deficiency anemia secondary to blood loss (chronic): Secondary | ICD-10-CM | POA: Insufficient documentation

## 2023-07-04 DIAGNOSIS — D509 Iron deficiency anemia, unspecified: Secondary | ICD-10-CM

## 2023-07-04 DIAGNOSIS — N920 Excessive and frequent menstruation with regular cycle: Secondary | ICD-10-CM | POA: Diagnosis not present

## 2023-07-04 LAB — CBC WITH DIFFERENTIAL/PLATELET
Abs Immature Granulocytes: 0.02 10*3/uL (ref 0.00–0.07)
Basophils Absolute: 0 10*3/uL (ref 0.0–0.1)
Basophils Relative: 1 %
Eosinophils Absolute: 0.1 10*3/uL (ref 0.0–0.5)
Eosinophils Relative: 2 %
HCT: 42.8 % (ref 36.0–46.0)
Hemoglobin: 14.3 g/dL (ref 12.0–15.0)
Immature Granulocytes: 0 %
Lymphocytes Relative: 22 %
Lymphs Abs: 1.2 10*3/uL (ref 0.7–4.0)
MCH: 30.3 pg (ref 26.0–34.0)
MCHC: 33.4 g/dL (ref 30.0–36.0)
MCV: 90.7 fL (ref 80.0–100.0)
Monocytes Absolute: 0.5 10*3/uL (ref 0.1–1.0)
Monocytes Relative: 8 %
Neutro Abs: 3.7 10*3/uL (ref 1.7–7.7)
Neutrophils Relative %: 67 %
Platelets: 229 10*3/uL (ref 150–400)
RBC: 4.72 MIL/uL (ref 3.87–5.11)
RDW: 13 % (ref 11.5–15.5)
WBC: 5.5 10*3/uL (ref 4.0–10.5)
nRBC: 0 % (ref 0.0–0.2)

## 2023-07-04 LAB — IRON AND TIBC
Iron: 92 ug/dL (ref 28–170)
Saturation Ratios: 24 % (ref 10.4–31.8)
TIBC: 386 ug/dL (ref 250–450)
UIBC: 294 ug/dL

## 2023-07-04 LAB — FERRITIN: Ferritin: 29 ng/mL (ref 11–307)

## 2023-07-10 NOTE — Progress Notes (Unsigned)
 [facility] [address] [place] [address]   Clinic Day:  07/10/2023  Referring physician: Babs Sciara, MD  Patient Care Team: Babs Sciara, MD as PCP - General (Family Medicine) Doreatha Massed, MD as Medical Oncologist (Hematology)   ASSESSMENT & PLAN:   Assessment:  1.  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 - Positive for ice pica.  No prior history of transfusion or parenteral iron. - She cannot tolerate oral iron therapy. - She reports night sweats for the past 3 to 4 months, 3 times per week, drenching type.  2.  Social/family history: - Lives at home with her husband and children.  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  Non-smoker. - MGM: Breast cancer, PGM: Colon cancer, maternal first cousin died of TNBC at 58.  3.  Multiple sclerosis: - Usual symptoms: Numbness on the right side of the body, balance problems and vision issues. Ramiro Harvest every 6 months for the past 3 years.  Plan:  1.  Severe iron deficiency anemia from blood loss: -Labs from 07/04/2023 show hemoglobin of 14.3, MCV 90.7.  Differential is normal.  Iron saturations 24% with normal TIBC.  Ferritin is 29. -She received 3 doses of IV Ferrlecit on 01/28/2023, 02/15/2023 and 02/22/2023. -Unable to tolerate IV Venofer.   -Patient had total hysterectomy on 01/22/2023. -  PLAN SUMMARY: >> Recommend 4 doses of Ferrlecit after hysterectomy beginning on 01/28/2023. >> RTC mid March for lab work and see provider after she returns from [place].    No orders of the defined types were placed in this encounter.   Mauro Kaufmann, NP   3/26/20252:55 PM  CHIEF COMPLAINT/PURPOSE OF CONSULT:   Diagnosis: iron deficiency anemia  Current Therapy: Parenteral iron therapy  HISTORY OF PRESENT ILLNESS:   Lisa [name] is a 43 y.o. female presenting to clinic today for  follow-up for iron deficiency anemia.  She received 3 doses of Ferrlecit on 01/28/2023, 02/15/2023 and 02/22/2023.  She had total hysterectomy 01/22/2023.  She was evaluated on 02/08/2023 and Lisa [name], ED for abdominal bloating and discomfort following her hysterectomy.  Labs and imaging were reassuring and thought to be secondary to MS.  She just returned home from [place] with her son who required an orthopedic surgery and close monitoring.   She reports energy of 90% appetite of 100%.  Denies any pain.  Bleeding has improved dramatically since her hysterectomy.  She has history of MS and is followed by neurology at [facility].  Ramiro Harvest every 6 months and her next dose will be given in April .   PAST MEDICAL HISTORY:   Past Medical History: Past Medical History:  Diagnosis Date   Anemia    2024 recieving iron infusions / Follows with [facility].   Anxiety    Follows w/ PCP.   Headache(784.0)    hx of migraines, rarely has migraines anymore   Hypothyroidism    Multiple sclerosis (HCC)    Follows w/ [facility], Dr. Clint Bolder.   Pneumonia 06/26/2020   right lower lobe   PONV (postoperative nausea and vomiting)    Pre-diabetes    RLS (restless legs syndrome)    Stroke [facility])    Patient states that she was hospitalized around 2021 for facial weakness (pt states face was not working). She states all symptoms resolved in 4 days. Patient  states she was never given a clear answer if it was a stroke or more likely an MS flare.   Thyroid nodule    Urinary incontinence    with MS flares    Surgical History: Past Surgical History:  Procedure Laterality Date   ADENOIDECTOMY     as a child   CESAREAN SECTION  2006   CESAREAN SECTION  2008   LAPAROSCOPIC VAGINAL HYSTERECTOMY WITH SALPINGECTOMY Bilateral 01/22/2023   Procedure: LAPAROSCOPIC ASSISTED VAGINAL HYSTERECTOMY WITH SALPINGECTOMY;  Surgeon: Harold Hedge, MD;  Location: [facility];  Service: Gynecology;  Laterality: Bilateral;   THYROIDECTOMY  06/18/2011   Procedure: THYROIDECTOMY;  Surgeon: Christia Reading, MD;  Location: [facility] OR;  Service: ENT;  Laterality: Right;   TONSILLECTOMY     as a child   TYMPANOPLASTY     around 2008    Social History: Social History   Socioeconomic History   Marital status: Married    Spouse name: Not on file   Number of children: Not on file   Years of education: Not on file   Highest education level: Not on file  Occupational History   Not on file  Tobacco Use   Smoking status: Former    Types: Cigarettes   Smokeless tobacco: Never   Tobacco comments:    Only smoked in high school for about a year. Patient states she only smoked about 2 cigarettes per day.  Vaping Use   Vaping status: Never Used  Substance and Sexual Activity   Alcohol use: Not Currently   Drug use: No   Sexual activity: Yes    Birth control/protection: None  Other Topics Concern   Not on file  Social History Narrative   Not on file   Social Drivers of Health   Financial Resource Strain: Not on file  Food Insecurity: No Food Insecurity (08/03/2022)   Hunger Vital Sign    Worried About Running Out of Food in the Last Year: Never true    Ran Out of Food in the Last Year: Never true  Transportation Needs: No Transportation Needs (08/03/2022)   PRAPARE - Administrator, Civil Service (Medical): No    Lack of Transportation (Non-Medical): No  Physical Activity: Not on file  Stress: Not on file  Social Connections: Not on file  Intimate Partner Violence: Not At Risk (08/03/2022)   Humiliation, Afraid, Rape, and Kick questionnaire    Fear of Current or Ex-Partner: No    Emotionally Abused: No    Physically Abused: No    Sexually Abused: No    Family History: Family History  Problem Relation Age of Onset   Breast cancer Maternal Grandmother 68   Breast cancer Cousin 82       maternal first cousin    Current  Medications:  Current Outpatient Medications:    acetaminophen (TYLENOL) 500 MG tablet, Take 2 tablets (1,000 mg total) by mouth every 6 (six) hours as needed., Disp: 30 tablet, Rfl: 0   baclofen (LIORESAL) 20 MG tablet, Take 20 mg by mouth at bedtime as needed for muscle spasms., Disp: , Rfl:    Cholecalciferol (VITAMIN D-3 PO), Take 1 capsule by mouth daily., Disp: , Rfl:    citalopram (CELEXA) 40 MG tablet, Take 1 tablet (40 mg total) by mouth daily. (Patient taking differently: Take 20 mg by mouth daily.), Disp: 90 tablet, Rfl: 2   Cyanocobalamin (VITAMIN B-12 PO), Take 1 tablet by mouth daily., Disp: , Rfl:  ferric gluconate (FERRLECIT) 12.5 MG/ML injection, Every 2 weeks, Disp: , Rfl:    levothyroxine (SYNTHROID) 100 MCG tablet, Take 1 tablet (100 mcg total) by mouth daily before breakfast., Disp: 90 tablet, Rfl: 0   Ocrelizumab (OCREVUS IV), Inject 600 mg into the vein every 6 (six) months., Disp: , Rfl:    rOPINIRole (REQUIP) 0.25 MG tablet, Take 0.5 mg by mouth at bedtime as needed (muscle spasms)., Disp: , Rfl:    traMADol (ULTRAM) 50 MG tablet, Take 1 tablet (50 mg total) by mouth every 6 (six) hours as needed for severe pain., Disp: 20 tablet, Rfl: 0   Allergies: Allergies  Allergen Reactions   Sulfa Antibiotics Anaphylaxis   Venofer [Iron Sucrose] Hives, Shortness Of Breath and Other (See Comments)    Also complained of knee, hand, and finger stiffness. Reaction occurred during post-infusion monitoring period. Required IV diphenhydramine. See progress note from 08/07/2022.   Darvocet [Propoxyphene N-Acetaminophen] Other (See Comments)    Hallucinations    Imitrex [Sumatriptan Base] Other (See Comments)    Lock-jaw   Keflex [Cephalexin] Rash    Childhood allergy   Penicillins Rash    Childhood allergy    REVIEW OF SYSTEMS:   Review of Systems - Oncology   VITALS:   There were no vitals taken for this visit.  Wt Readings from Last 3 Encounters:  02/07/23 200 lb (90.7  kg)  01/28/23 217 lb 14.4 oz (98.8 kg)  01/22/23 217 lb (98.4 kg)    There is no height or weight on file to calculate BMI.   PHYSICAL EXAM:   Physical Exam  LABS:      Latest Ref Rng & Units 07/04/2023   11:03 AM 02/07/2023   10:56 PM 01/23/2023   12:14 AM  CBC  WBC 4.0 - 10.5 K/uL 5.5  12.4  11.1   Hemoglobin 12.0 - 15.0 g/dL 16.1  09.6  04.5   Hematocrit 36.0 - 46.0 % 42.8  35.8  32.2   Platelets 150 - 400 K/uL 229  228  232       Latest Ref Rng & Units 02/07/2023   10:56 PM 01/15/2023   10:44 AM 08/12/2022   11:30 AM  CMP  Glucose 70 - 99 mg/dL 409  72  82   BUN 6 - 20 mg/dL 13  13  13    Creatinine 0.44 - 1.00 mg/dL 8.11  9.14  7.82   Sodium 135 - 145 mmol/L 137  139  136   Potassium 3.5 - 5.1 mmol/L 3.7  3.9  3.6   Chloride 98 - 111 mmol/L 104  105  103   CO2 22 - 32 mmol/L 26  23  26    Calcium 8.9 - 10.3 mg/dL 8.6  8.8  8.9   Total Protein 6.5 - 8.1 g/dL 6.5   7.5   Total Bilirubin 0.3 - 1.2 mg/dL 0.7   0.8   Alkaline Phos 38 - 126 U/L 54   51   AST 15 - 41 U/L 20   14   ALT 0 - 44 U/L 30   14      No results found for: "CEA1", "CEA" / No results found for: "CEA1", "CEA" No results found for: "PSA1" No results found for: "NFA213" No results found for: "CAN125"  No results found for: "TOTALPROTELP", "ALBUMINELP", "A1GS", "A2GS", "BETS", "BETA2SER", "GAMS", "MSPIKE", "SPEI" Lab Results  Component Value Date   TIBC 386 07/04/2023   TIBC 404 01/10/2023   TIBC 417  10/25/2022   FERRITIN 29 07/04/2023   FERRITIN 23 01/10/2023   FERRITIN 13 10/25/2022   IRONPCTSAT 24 07/04/2023   IRONPCTSAT 21 01/10/2023   IRONPCTSAT 17 10/25/2022   No results found for: "LDH"   STUDIES:   No results found.

## 2023-07-11 ENCOUNTER — Inpatient Hospital Stay (HOSPITAL_BASED_OUTPATIENT_CLINIC_OR_DEPARTMENT_OTHER): Payer: 59 | Admitting: Oncology

## 2023-07-11 VITALS — BP 112/81 | HR 70 | Temp 97.0°F | Resp 18 | Wt 223.0 lb

## 2023-07-11 DIAGNOSIS — D500 Iron deficiency anemia secondary to blood loss (chronic): Secondary | ICD-10-CM | POA: Diagnosis not present

## 2023-07-11 DIAGNOSIS — D509 Iron deficiency anemia, unspecified: Secondary | ICD-10-CM

## 2023-07-11 DIAGNOSIS — G35 Multiple sclerosis: Secondary | ICD-10-CM

## 2023-07-19 ENCOUNTER — Inpatient Hospital Stay: Attending: Hematology

## 2023-07-19 VITALS — BP 119/82 | HR 63 | Temp 98.2°F | Resp 18

## 2023-07-19 DIAGNOSIS — D500 Iron deficiency anemia secondary to blood loss (chronic): Secondary | ICD-10-CM | POA: Insufficient documentation

## 2023-07-19 DIAGNOSIS — N920 Excessive and frequent menstruation with regular cycle: Secondary | ICD-10-CM | POA: Diagnosis not present

## 2023-07-19 DIAGNOSIS — D509 Iron deficiency anemia, unspecified: Secondary | ICD-10-CM

## 2023-07-19 MED ORDER — FAMOTIDINE IN NACL 20-0.9 MG/50ML-% IV SOLN
20.0000 mg | Freq: Once | INTRAVENOUS | Status: AC
Start: 2023-07-19 — End: 2023-07-19
  Administered 2023-07-19: 20 mg via INTRAVENOUS
  Filled 2023-07-19: qty 50

## 2023-07-19 MED ORDER — KETOROLAC TROMETHAMINE 30 MG/ML IJ SOLN
30.0000 mg | Freq: Once | INTRAMUSCULAR | Status: AC
Start: 2023-07-19 — End: 2023-07-19
  Administered 2023-07-19: 30 mg via INTRAVENOUS
  Filled 2023-07-19: qty 1

## 2023-07-19 MED ORDER — SODIUM CHLORIDE 0.9 % IV SOLN: Freq: Once | INTRAVENOUS | Status: AC

## 2023-07-19 MED ORDER — CETIRIZINE HCL 10 MG/ML IV SOLN
10.0000 mg | Freq: Once | INTRAVENOUS | Status: AC
  Administered 2023-07-19: 10 mg via INTRAVENOUS
  Filled 2023-07-19: qty 1

## 2023-07-19 MED ORDER — METHYLPREDNISOLONE SODIUM SUCC 125 MG IJ SOLR
125.0000 mg | Freq: Once | INTRAMUSCULAR | Status: AC
  Administered 2023-07-19: 125 mg via INTRAVENOUS
  Filled 2023-07-19: qty 2

## 2023-07-19 MED ORDER — SODIUM CHLORIDE 0.9 % IV SOLN
125.0000 mg | Freq: Once | INTRAVENOUS | Status: AC
  Administered 2023-07-19: 125 mg via INTRAVENOUS
  Filled 2023-07-19: qty 10

## 2023-07-19 NOTE — Patient Instructions (Signed)
 [facility] [place] - [facility] DE[facility]  Discharge Instructions: Thank you for choosing [facility] to provide your oncology and hematology care.  If you have a lab appointment with the Cancer Center - please note that after April 8th, 2024, all labs will be drawn in the cancer center.  You do not have to check in or register with the main entrance as you have in the past but will complete your check-in in the cancer center.  Wear comfortable clothing and clothing appropriate for easy access to any Portacath or PICC line.   We strive to give you quality time with your provider. You may need to reschedule your appointment if you arrive late (15 or more minutes).  Arriving late affects you and other patients whose appointments are after yours.  Also, if you miss three or more appointments without notifying the office, you may be dismissed from the clinic at the provider's discretion.      For prescription refill requests, have your pharmacy contact our office and allow 72 hours for refills to be completed.    Today you received Ferrlecit IV infusion.     BELOW ARE SYMPTOMS THAT SHOULD BE REPORTED IMMEDIATELY: *FEVER GREATER THAN 100.4 F (38 C) OR HIGHER *CHILLS OR SWEATING *NAUSEA AND VOMITING THAT IS NOT CONTROLLED WITH YOUR NAUSEA MEDICATION *UNUSUAL SHORTNESS OF BREATH *UNUSUAL BRUISING OR BLEEDING *URINARY PROBLEMS (pain or burning when urinating, or frequent urination) *BOWEL PROBLEMS (unusual diarrhea, constipation, pain near the anus) TENDERNESS IN MOUTH AND THROAT WITH OR WITHOUT PRESENCE OF ULCERS (sore throat, sores in mouth, or a toothache) UNUSUAL RASH, SWELLING OR PAIN  UNUSUAL VAGINAL DISCHARGE OR ITCHING   Items with * indicate a potential emergency and should be followed up as soon as possible or go to the Emergency Department if any problems should occur.  Please show the CHEMOTHERAPY ALERT CARD or IMMUNOTHERAPY ALERT CARD at  check-in to the Emergency Department and triage nurse.  Should you have questions after your visit or need to cancel or reschedule your appointment, please contact [facility]  - A DEPT OF [facility] [phone]  and follow the prompts.  Office hours are 8:00 a.m. to 4:30 p.m. Monday - Friday. Please note that voicemails left after 4:00 p.m. may not be returned until the following business day.  We are closed weekends and major holidays. You have access to a nurse at all times for urgent questions. Please call the main number to the clinic [phone] and follow the prompts.  For any non-urgent questions, you may also contact your provider using MyChart. We now offer e-Visits for anyone 24 and older to request care online for non-urgent symptoms. For details visit [email or web address].   Also download the MyChart app! Go to the app store, search "MyChart", open the app, select [place], and log in with your MyChart username and password.

## 2023-07-19 NOTE — Progress Notes (Signed)
Patient presents today for iron infusion. Patient is in satisfactory condition with no new complaints voiced.  Vital signs are stable.  We will proceed with infusion per provider orders.    Peripheral IV started with good blood return pre and post infusion.  Ferrlecit 125 mg given today per MD orders. Tolerated infusion without adverse affects. Vital signs stable. No complaints at this time. Discharged from clinic ambulatory in stable condition. Alert and oriented x 3. F/U with [facility] as scheduled.

## 2023-07-24 ENCOUNTER — Ambulatory Visit: Payer: Medicare Other | Admitting: Family Medicine

## 2023-07-24 ENCOUNTER — Other Ambulatory Visit: Payer: Self-pay

## 2023-07-24 ENCOUNTER — Encounter: Payer: Self-pay | Admitting: Family Medicine

## 2023-07-24 ENCOUNTER — Other Ambulatory Visit: Payer: Self-pay | Admitting: Medical Genetics

## 2023-07-24 VITALS — BP 108/70 | HR 70 | Temp 97.2°F | Ht 69.0 in | Wt 228.0 lb

## 2023-07-24 DIAGNOSIS — R232 Flushing: Secondary | ICD-10-CM

## 2023-07-24 DIAGNOSIS — R7989 Other specified abnormal findings of blood chemistry: Secondary | ICD-10-CM | POA: Diagnosis not present

## 2023-07-24 DIAGNOSIS — Z13220 Encounter for screening for lipoid disorders: Secondary | ICD-10-CM

## 2023-07-24 DIAGNOSIS — G35 Multiple sclerosis: Secondary | ICD-10-CM | POA: Diagnosis not present

## 2023-07-24 DIAGNOSIS — E890 Postprocedural hypothyroidism: Secondary | ICD-10-CM

## 2023-07-24 DIAGNOSIS — E559 Vitamin D deficiency, unspecified: Secondary | ICD-10-CM

## 2023-07-24 DIAGNOSIS — E785 Hyperlipidemia, unspecified: Secondary | ICD-10-CM | POA: Diagnosis not present

## 2023-07-24 DIAGNOSIS — E538 Deficiency of other specified B group vitamins: Secondary | ICD-10-CM

## 2023-07-24 MED ORDER — CITALOPRAM HYDROBROMIDE 20 MG PO TABS: 20.0000 mg | ORAL_TABLET | Freq: Every day | ORAL | 3 refills | Status: AC

## 2023-07-24 MED ORDER — LEVOTHYROXINE SODIUM 100 MCG PO TABS: 100.0000 ug | ORAL_TABLET | Freq: Every day | ORAL | 3 refills | Status: DC

## 2023-07-24 NOTE — Progress Notes (Signed)
 Subjective:    Patient ID: Lisa [name], female    DOB: Oct 03, 1980, 43 y.o.   MRN: [id number]  HPI Follow up hysterectomy 10/ 2024  Menopausal symptoms - night sweats and possible hromonal changes Thyroid check Discussed the use of AI scribe software for clinical note transcription with the patient, who gave verbal consent to proceed.  History of Present Illness   Lisa [name] is a 43 year old female with multiple sclerosis who presents for follow-up after returning from [place]. She is accompanied by her child, [place].  Since returning from [place] in early March, she has faced health challenges, primarily due to a delay in her Ocrevus infusion. Her multiple sclerosis is primarily progressive, affecting her spine more than her brain, leading to lower body functional issues. She has been reducing her medication load, halving her Celexa and discontinuing baclofen and Rebif, as she was uncertain if her symptoms were due to the medications or the condition itself. Despite ongoing muscle spasms, she feels better with fewer medications. Her lymphocytes were low, possibly due to Ocrevus, and her energy levels are fair with good and bad days.  She underwent a hysterectomy before her trip to [place] to help maintain her iron levels. However, her ferritin level was 29 two weeks ago, which is low. She aims to increase her ferritin to around 100 and stabilize it for periodic maintenance.  She reports symptoms that may suggest menopause, such as night sweats, and is curious if these are related to her MS or hormonal changes. She maintains a strict 1200 calorie diet but has gained 20 pounds, which she finds challenging given her physical limitations and the need to care for [place].  She recently returned from a stay in [place], where she lived from November to March. She follows a strict 1200 calorie diet and engages in some physical activity, focusing on cardio due to difficulties with  strength training. She is mindful of her dietary habits and tries to maintain her physical health despite challenges.       Review of Systems     Objective:   Physical Exam General-in no acute distress Eyes-no discharge Lungs-respiratory rate normal, CTA CV-no murmurs,RRR Extremities skin warm dry no edema Neuro grossly normal Behavior normal, alert        Assessment & Plan:  Assessment and Plan    Multiple Sclerosis (MS) Primary progressive MS with spinal involvement. Recent Ocrevus delay exacerbated symptoms. Weaning off baclofen and Rebif due to side effects. MS specialist recommends continued Ocrevus for spinal lesion management. - Continue Ocrevus infusions every six months. - Monitor MS symptoms and adjust symptomatic medications as needed. - Order MRI as needed to monitor disease progression.  Iron Deficiency Anemia Low ferritin at 29. Post-hysterectomy for iron level maintenance. Goal to increase ferritin to 100 with periodic infusions. - Administer iron infusions to increase ferritin levels. - Monitor ferritin and iron saturation levels. - Plan for periodic iron infusions once levels are stabilized.  Hypothyroidism Well-controlled on levothyroxine. Thyroid function tests needed for dosing verification. - Order TSH and free T4 tests. - Refill levothyroxine prescription.  Menopausal Symptoms Symptoms suggestive of menopause. Differential includes menopausal transition vs. MS-related symptoms. Discussed potential hormone replacement therapy with MS specialist. - Order FSH and LH tests to assess menopausal status. - Discuss potential future need for hormone replacement therapy with MS specialist.  General Health Maintenance Low lymphocytes likely due to Ocrevus. Normal kidney and liver function. Cholesterol not recently assessed. - Order cholesterol panel. -  Encourage continued physical activity within tolerance. - Monitor dietary habits and weight  management.  Follow-up Open to blood work and cost-effective pharmacy management. - Order blood work including thyroid function, vitamin D, B12, FSH, LH, and cholesterol. - Send citalopram 20 mg prescription to pharmacy with refills. - Follow up on blood work results and adjust treatment as necessary.      1. Elevated lipids (Primary) Check lab work await results healthy diet - Lipid Panel  2. Postoperative hypothyroidism Continue current medication - FSH/LH - Vitamin D, 25-hydroxy - Vitamin B12  3. Multiple sclerosis (HCC) Under good control follows with specialist - FSH/LH - Vitamin D, 25-hydroxy - Vitamin B12  4. Low vitamin D level Check labs - Vitamin D, 25-hydroxy  5. Vitamin B12 deficiency Check labs - Vitamin B12  6. Hot flashes Patient with a lot of hot flashes still has her ovaries has a lot of sweats reasonable to check LH FSH - FSH/LH  Celexa patient states she is doing better at 20 mg therefore we changed the dosing

## 2023-07-25 ENCOUNTER — Encounter: Payer: Self-pay | Admitting: Family Medicine

## 2023-07-25 LAB — VITAMIN B12: Vitamin B-12: 509 pg/mL (ref 232–1245)

## 2023-07-25 LAB — FSH/LH
FSH: 15.7 m[IU]/mL
LH: 26.6 m[IU]/mL

## 2023-07-25 LAB — LIPID PANEL
Chol/HDL Ratio: 3 ratio (ref 0.0–4.4)
Cholesterol, Total: 189 mg/dL (ref 100–199)
HDL: 62 mg/dL (ref >39)
LDL Chol Calc (NIH): 114 mg/dL — ABNORMAL HIGH (ref 0–99)
Triglycerides: 71 mg/dL (ref 0–149)
VLDL Cholesterol Cal: 13 mg/dL (ref 5–40)

## 2023-07-25 LAB — VITAMIN D 25 HYDROXY (VIT D DEFICIENCY, FRACTURES): Vit D, 25-Hydroxy: 32.7 ng/mL (ref 30.0–100.0)

## 2023-07-25 MED FILL — Sod Ferric Gluc Cmplx in Sucrose IV Soln 12.5 MG/ML (Fe Eq): INTRAVENOUS | Qty: 10 | Status: AC

## 2023-07-26 ENCOUNTER — Other Ambulatory Visit (HOSPITAL_COMMUNITY)

## 2023-07-26 ENCOUNTER — Inpatient Hospital Stay

## 2023-07-26 VITALS — BP 122/81 | HR 67 | Temp 98.4°F | Resp 18

## 2023-07-26 DIAGNOSIS — D509 Iron deficiency anemia, unspecified: Secondary | ICD-10-CM

## 2023-07-26 DIAGNOSIS — D500 Iron deficiency anemia secondary to blood loss (chronic): Secondary | ICD-10-CM | POA: Diagnosis not present

## 2023-07-26 MED ORDER — ACETAMINOPHEN 325 MG PO TABS
650.0000 mg | ORAL_TABLET | Freq: Once | ORAL | Status: AC
  Administered 2023-07-26: 650 mg via ORAL
  Filled 2023-07-26: qty 2

## 2023-07-26 MED ORDER — FAMOTIDINE IN NACL 20-0.9 MG/50ML-% IV SOLN
20.0000 mg | Freq: Once | INTRAVENOUS | Status: AC
  Administered 2023-07-26: 20 mg via INTRAVENOUS
  Filled 2023-07-26: qty 50

## 2023-07-26 MED ORDER — CETIRIZINE HCL 10 MG/ML IV SOLN
10.0000 mg | Freq: Once | INTRAVENOUS | Status: AC
  Administered 2023-07-26: 10 mg via INTRAVENOUS
  Filled 2023-07-26: qty 1

## 2023-07-26 MED ORDER — SODIUM CHLORIDE 0.9 % IV SOLN: Freq: Once | INTRAVENOUS | Status: AC

## 2023-07-26 MED ORDER — METHYLPREDNISOLONE SODIUM SUCC 125 MG IJ SOLR
125.0000 mg | Freq: Once | INTRAMUSCULAR | Status: AC
Start: 2023-07-26 — End: 2023-07-26
  Administered 2023-07-26: 125 mg via INTRAVENOUS
  Filled 2023-07-26: qty 2

## 2023-07-26 MED ORDER — SODIUM CHLORIDE 0.9 % IV SOLN
125.0000 mg | Freq: Once | INTRAVENOUS | Status: AC
  Administered 2023-07-26: 125 mg via INTRAVENOUS
  Filled 2023-07-26: qty 125

## 2023-07-26 NOTE — Patient Instructions (Signed)
 [facility] [place] [facility]  Discharge Instructions: Thank you for choosing [facility] to provide your oncology and hematology care.  If you have a lab appointment with the Cancer Center - please note that after April 8th, 2024, all labs will be drawn in the cancer center.  You do not have to check in or register with the main entrance as you have in the past but will complete your check-in in the cancer center.  Wear comfortable clothing and clothing appropriate for easy access to any Portacath or PICC line.   We strive to give you quality time with your provider. You may need to reschedule your appointment if you arrive late (15 or more minutes).  Arriving late affects you and other patients whose appointments are after yours.  Also, if you miss three or more appointments without notifying the office, you may be dismissed from the clinic at the provider's discretion.      For prescription refill requests, have your pharmacy contact our office and allow 72 hours for refills to be completed.    Today you received the following chemotherapy and/or immunotherapy agents Venofer. Iron Sucrose Injection What is this medication? IRON SUCROSE (EYE ern SOO krose) treats low levels of iron (iron deficiency anemia) in people with kidney disease. Iron is a mineral that plays an important role in making red blood cells, which carry oxygen from your lungs to the rest of your body. This medicine may be used for other purposes; ask your health care provider or pharmacist if you have questions. COMMON BRAND NAME(S): Venofer What should I tell my care team before I take this medication? They need to know if you have any of these conditions: Anemia not caused by low iron levels Heart disease High levels of iron in the blood Kidney disease Liver disease An unusual or allergic reaction to iron, other medications, foods, dyes, or preservatives Pregnant or  trying to get pregnant Breastfeeding How should I use this medication? This medication is infused into a vein. It is given by your care team in a hospital or clinic setting. Talk to your care team about the use of this medication in children. While it may be prescribed for children as young as 2 years for selected conditions, precautions do apply. Overdosage: If you think you have taken too much of this medicine contact a poison control center or emergency room at once. NOTE: This medicine is only for you. Do not share this medicine with others. What if I miss a dose? Keep appointments for follow-up doses. It is important not to miss your dose. Call your care team if you are unable to keep an appointment. What may interact with this medication? Do not take this medication with any of the following: Deferoxamine Dimercaprol Other iron products This medication may also interact with the following: Chloramphenicol Deferasirox This list may not describe all possible interactions. Give your health care provider a list of all the medicines, herbs, non-prescription drugs, or dietary supplements you use. Also tell them if you smoke, drink alcohol, or use illegal drugs. Some items may interact with your medicine. What should I watch for while using this medication? Visit your care team for regular checks on your progress. Tell your care team if your symptoms do not start to get better or if they get worse. You may need blood work done while you are taking this medication. You may need to eat  more foods that contain iron. Talk to your care team. Foods that contain iron include whole grains or cereals, dried fruits, beans, peas, leafy green vegetables, and organ meats (liver, kidney). What side effects may I notice from receiving this medication? Side effects that you should report to your care team as soon as possible: Allergic reactions--skin rash, itching, hives, swelling of the face, lips, tongue,  or throat Low blood pressure--dizziness, feeling faint or lightheaded, blurry vision Shortness of breath Side effects that usually do not require medical attention (report to your care team if they continue or are bothersome): Flushing Headache Joint pain Muscle pain Nausea Pain, redness, or irritation at injection site This list may not describe all possible side effects. Call your doctor for medical advice about side effects. You may report side effects to FDA at 1-800-FDA-1088. Where should I keep my medication? This medication is given in a hospital or clinic. It will not be stored at home. NOTE: This sheet is a summary. It may not cover all possible information. If you have questions about this medicine, talk to your doctor, pharmacist, or health care provider.  2024 Elsevier/Gold Standard (2022-11-21 00:00:00)      To help prevent nausea and vomiting after your treatment, we encourage you to take your nausea medication as directed.  BELOW ARE SYMPTOMS THAT SHOULD BE REPORTED IMMEDIATELY: *FEVER GREATER THAN 100.4 F (38 C) OR HIGHER *CHILLS OR SWEATING *NAUSEA AND VOMITING THAT IS NOT CONTROLLED WITH YOUR NAUSEA MEDICATION *UNUSUAL SHORTNESS OF BREATH *UNUSUAL BRUISING OR BLEEDING *URINARY PROBLEMS (pain or burning when urinating, or frequent urination) *BOWEL PROBLEMS (unusual diarrhea, constipation, pain near the anus) TENDERNESS IN MOUTH AND THROAT WITH OR WITHOUT PRESENCE OF ULCERS (sore throat, sores in mouth, or a toothache) UNUSUAL RASH, SWELLING OR PAIN  UNUSUAL VAGINAL DISCHARGE OR ITCHING   Items with * indicate a potential emergency and should be followed up as soon as possible or go to the Emergency Department if any problems should occur.  Please show the CHEMOTHERAPY ALERT CARD or IMMUNOTHERAPY ALERT CARD at check-in to the Emergency Department and triage nurse.  Should you have questions after your visit or need to cancel or reschedule your appointment, please  contact [facility] [place] - A DEPT OF [facility] [phone]  and follow the prompts.  Office hours are 8:00 a.m. to 4:30 p.m. Monday - Friday. Please note that voicemails left after 4:00 p.m. may not be returned until the following business day.  We are closed weekends and major holidays. You have access to a nurse at all times for urgent questions. Please call the main number to the clinic [phone] and follow the prompts.  For any non-urgent questions, you may also contact your provider using MyChart. We now offer e-Visits for anyone 70 and older to request care online for non-urgent symptoms. For details visit [email or web address].   Also download the MyChart app! Go to the app store, search "MyChart", open the app, select [place], and log in with your MyChart username and password.

## 2023-07-26 NOTE — Progress Notes (Signed)
 Patient presents today for iron infusion.  Patient is in satisfactory condition with no new complaints voiced.  Vital signs are stable.  IV placed in R arm. IV flushed well with good blood return noted.  We will proceed with infusion per provider orders.

## 2023-07-26 NOTE — Progress Notes (Signed)
Ferrlecit given today per MD orders. Tolerated infusion without adverse affects. Vital signs stable. No complaints at this time. Discharged from clinic ambulatory in stable condition. Alert and oriented x 3. F/U with [facility] as scheduled.

## 2023-08-01 MED FILL — Sod Ferric Gluc Cmplx in Sucrose IV Soln 12.5 MG/ML (Fe Eq): INTRAVENOUS | Qty: 10 | Status: AC

## 2023-08-02 ENCOUNTER — Inpatient Hospital Stay

## 2023-08-02 VITALS — BP 109/82 | HR 70 | Temp 97.5°F | Resp 18

## 2023-08-02 DIAGNOSIS — D500 Iron deficiency anemia secondary to blood loss (chronic): Secondary | ICD-10-CM | POA: Diagnosis not present

## 2023-08-02 DIAGNOSIS — D509 Iron deficiency anemia, unspecified: Secondary | ICD-10-CM

## 2023-08-02 MED ORDER — SODIUM CHLORIDE 0.9 % IV SOLN
125.0000 mg | Freq: Once | INTRAVENOUS | Status: AC
  Administered 2023-08-02: 125 mg via INTRAVENOUS
  Filled 2023-08-02: qty 125

## 2023-08-02 MED ORDER — ACETAMINOPHEN 325 MG PO TABS
650.0000 mg | ORAL_TABLET | Freq: Once | ORAL | Status: AC
  Administered 2023-08-02: 650 mg via ORAL
  Filled 2023-08-02: qty 2

## 2023-08-02 MED ORDER — METHYLPREDNISOLONE SODIUM SUCC 125 MG IJ SOLR
125.0000 mg | Freq: Once | INTRAMUSCULAR | Status: AC
  Administered 2023-08-02: 125 mg via INTRAVENOUS
  Filled 2023-08-02: qty 2

## 2023-08-02 MED ORDER — FAMOTIDINE IN NACL 20-0.9 MG/50ML-% IV SOLN
20.0000 mg | Freq: Once | INTRAVENOUS | Status: AC
  Administered 2023-08-02: 20 mg via INTRAVENOUS
  Filled 2023-08-02: qty 50

## 2023-08-02 MED ORDER — CETIRIZINE HCL 10 MG/ML IV SOLN
10.0000 mg | Freq: Once | INTRAVENOUS | Status: AC
  Administered 2023-08-02: 10 mg via INTRAVENOUS
  Filled 2023-08-02: qty 1

## 2023-08-02 MED ORDER — SODIUM CHLORIDE 0.9 % IV SOLN: Freq: Once | INTRAVENOUS | Status: AC

## 2023-08-02 NOTE — Progress Notes (Signed)
 Patient tolerated iron infusion with no complaints voiced.  Peripheral IV site clean and dry with good blood return noted before and after infusion.  Band aid applied.  VSS with discharge and left in satisfactory condition with no s/s of distress noted.

## 2023-08-02 NOTE — Patient Instructions (Signed)
 [facility] [place] [facility]  Discharge Instructions: Thank you for choosing [facility] to provide your oncology and hematology care.  If you have a lab appointment with the Cancer Center - please no

## 2023-08-27 NOTE — Telephone Encounter (Signed)
 NA

## 2023-09-19 ENCOUNTER — Emergency Department (HOSPITAL_COMMUNITY)
Admission: EM | Admit: 2023-09-19 | Discharge: 2023-09-19 | Discharge status: Against Medical Advice | Attending: Emergency Medicine | Admitting: Emergency Medicine

## 2023-09-19 ENCOUNTER — Encounter (HOSPITAL_COMMUNITY): Payer: Self-pay

## 2023-09-19 ENCOUNTER — Other Ambulatory Visit: Payer: Self-pay

## 2023-09-19 DIAGNOSIS — R200 Anesthesia of skin: Secondary | ICD-10-CM | POA: Diagnosis present

## 2023-09-19 DIAGNOSIS — Z5321 Procedure and treatment not carried out due to patient leaving prior to being seen by health care provider: Secondary | ICD-10-CM | POA: Insufficient documentation

## 2023-09-19 DIAGNOSIS — H547 Unspecified visual loss: Secondary | ICD-10-CM | POA: Insufficient documentation

## 2023-09-19 LAB — CBG MONITORING, ED: Glucose-Capillary: 127 mg/dL — ABNORMAL HIGH (ref 70–99)

## 2023-09-19 NOTE — ED Triage Notes (Signed)
 Pt arrived via POV from home after calling her neurologist and being advised to go to the ER for evaluation for new onset right arm numbness from this morning and vision loss since last week. Pt reports her doctors are on vacation currently and unable to see her. Pt reports she has a MRI scheduled for July 18th with [facility] but is unable to wait that long.

## 2023-09-19 NOTE — ED Notes (Signed)
 CBG documented in error.

## 2023-09-24 ENCOUNTER — Other Ambulatory Visit: Payer: Self-pay | Admitting: Family Medicine

## 2023-10-04 ENCOUNTER — Inpatient Hospital Stay: Attending: Hematology

## 2023-10-04 DIAGNOSIS — D500 Iron deficiency anemia secondary to blood loss (chronic): Secondary | ICD-10-CM | POA: Insufficient documentation

## 2023-10-04 DIAGNOSIS — D509 Iron deficiency anemia, unspecified: Secondary | ICD-10-CM

## 2023-10-04 DIAGNOSIS — N920 Excessive and frequent menstruation with regular cycle: Secondary | ICD-10-CM | POA: Insufficient documentation

## 2023-10-04 LAB — CBC WITH DIFFERENTIAL/PLATELET
Abs Immature Granulocytes: 0.03 10*3/uL (ref 0.00–0.07)
Basophils Absolute: 0 10*3/uL (ref 0.0–0.1)
Basophils Relative: 1 %
Eosinophils Absolute: 0.1 10*3/uL (ref 0.0–0.5)
Eosinophils Relative: 2 %
HCT: 42 % (ref 36.0–46.0)
Hemoglobin: 14.1 g/dL (ref 12.0–15.0)
Immature Granulocytes: 1 %
Lymphocytes Relative: 21 %
Lymphs Abs: 1.3 10*3/uL (ref 0.7–4.0)
MCH: 31.1 pg (ref 26.0–34.0)
MCHC: 33.6 g/dL (ref 30.0–36.0)
MCV: 92.7 fL (ref 80.0–100.0)
Monocytes Absolute: 0.4 10*3/uL (ref 0.1–1.0)
Monocytes Relative: 6 %
Neutro Abs: 4.4 10*3/uL (ref 1.7–7.7)
Neutrophils Relative %: 69 %
Platelets: 244 10*3/uL (ref 150–400)
RBC: 4.53 MIL/uL (ref 3.87–5.11)
RDW: 12.3 % (ref 11.5–15.5)
WBC: 6.4 10*3/uL (ref 4.0–10.5)
nRBC: 0 % (ref 0.0–0.2)

## 2023-10-04 LAB — IRON AND TIBC
Iron: 93 ug/dL (ref 28–170)
Saturation Ratios: 27 % (ref 10.4–31.8)
TIBC: 342 ug/dL (ref 250–450)
UIBC: 249 ug/dL

## 2023-10-04 LAB — FERRITIN: Ferritin: 69 ng/mL (ref 11–307)

## 2023-10-11 ENCOUNTER — Inpatient Hospital Stay: Admitting: Oncology

## 2023-10-11 DIAGNOSIS — D509 Iron deficiency anemia, unspecified: Secondary | ICD-10-CM

## 2023-10-11 NOTE — Progress Notes (Signed)
 [facility] [address], [place] [address]   Clinic Day:  10/11/2023  Referring physician: Alphonsa Glendia LABOR, MD  Patient Care Team: Alphonsa Glendia LABOR, MD as PCP - General (Family Medicine) Rogers Hai, MD as Medical Oncologist (Hematology)  I connected with Lisa [name] Luten on 10/11/23 at  1:00 PM EDT by telephone visit and verified that I am speaking with the correct person using two identifiers.   I discussed the limitations, risks, security and privacy concerns of performing an evaluation and management service by telemedicine and the availability of in-person appointments. I also discussed with the patient that there may be a patient responsible charge related to this service. The patient expressed understanding and agreed to proceed.   Other persons participating in the visit and their role in the encounter: NP, Patient    Patient's location: Home   Provider's location: Clinic    ASSESSMENT & PLAN:   Assessment:  1.  Severe iron  deficiency anemia from blood loss: - She has heavy menstrual bleeding from fibroids. - Positive for ice pica.  No prior history of transfusion or parenteral iron . - She cannot tolerate oral iron  therapy. - She reports night sweats for the past 3 to 4 months, 3 times per week, drenching type.  2.  Social/family history: - Lives at home with her husband and children.  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  Non-smoker. - MGM: Breast cancer, PGM: Colon cancer, maternal first cousin died of TNBC at 54.  3.  Multiple sclerosis: - Usual symptoms: Numbness on the right side of the body, balance problems and vision issues. - Receives Ocrevus every 6 months for the past 3 years.  Plan:  1.  Severe iron  deficiency anemia from blood loss: -Labs from 10/04/2023 show hemoglobin of 14.1, MCV 92.7.  Differential is normal.  Iron  saturations 27% with normal TIBC.  Ferritin is 69. -She  received 3 doses of IV Ferrlecit  on 07/19/2023, 07/26/2023 and 08/02/2023. -Unable to tolerate IV Venofer .   -Patient had total hysterectomy on 01/22/2023.  Had postop complication and bleeding but this is since resolved. -She does not need any additional IV iron  at this time.  Recommend follow-up in 3 to 4 months with labs a few days before and office visit.  PLAN SUMMARY: >> No additional IV iron  needed at this time. >> RTC in 3-4 months and an office visit.    Orders Placed This Encounter  Procedures   CBC with Differential    Standing Status:   Future    Expected Date:   02/10/2024    Expiration Date:   05/10/2024   Ferritin    Standing Status:   Future    Expected Date:   02/10/2024    Expiration Date:   05/10/2024   Iron  and TIBC ([facility] ONLY)    Standing Status:   Future    Expected Date:   02/10/2024    Expiration Date:   05/10/2024    Delon FORBES Hope, NP   6/27/20251:11 PM  CHIEF COMPLAINT/PURPOSE OF CONSULT:   Diagnosis: iron  deficiency anemia  Current Therapy: Parenteral iron  therapy  HISTORY OF PRESENT ILLNESS:   Lisa [name] is a 43 y.o. female presenting to clinic today for follow-up for iron  deficiency anemia.  She received 3 doses of Ferrlecit  on 07/19/2023, 07/26/2023 and 08/02/2023.  She had total hysterectomy 01/22/2023.  No additional bleeding since hysterectomy.  She just returned home from  [place]  with her son who required an orthopedic surgery and close monitoring.   She reports energy of 25% appetite of 100%.  Denies any pain.  Reports she has been struggling here recently and feels like it is probably related to her  Reports she has been struggling here recently with her energy levels and feels that it is likely due to her MS and less likely from iron  deficiency.  She receives Ocrevus every 6 months last was given in April.  She thinks that the humidity in the heat has something to do with how she is feeling as well.  She denies any  additional bleeding.   PAST MEDICAL HISTORY:   Past Medical History: Past Medical History:  Diagnosis Date   Anemia    2024 recieving iron  infusions / Follows with [facility].   Anxiety    Follows w/ PCP.   Headache(784.0)    hx of migraines, rarely has migraines anymore   Hypothyroidism    Multiple sclerosis (HCC)    Follows w/ [facility], Dr. Deward Pole.   Pneumonia 06/26/2020   right lower lobe   PONV (postoperative nausea and vomiting)    Pre-diabetes    RLS (restless legs syndrome)    Stroke [facility])    Patient states that she was hospitalized around 2021 for facial weakness (pt states face was not working). She states all symptoms resolved in 4 days. Patient states she was never given a clear answer if it was a stroke or more likely an MS flare.   Thyroid  nodule    Urinary incontinence    with MS flares    Surgical History: Past Surgical History:  Procedure Laterality Date   ADENOIDECTOMY     as a child   CESAREAN SECTION  2006   CESAREAN SECTION  2008   LAPAROSCOPIC VAGINAL HYSTERECTOMY WITH SALPINGECTOMY Bilateral 01/22/2023   Procedure: LAPAROSCOPIC ASSISTED VAGINAL HYSTERECTOMY WITH SALPINGECTOMY;  Surgeon: Curlene Agent, MD;  Location: [facility] [place];  Service: Gynecology;  Laterality: Bilateral;   THYROIDECTOMY  06/18/2011   Procedure: THYROIDECTOMY;  Surgeon: Vaughan Ricker, MD;  Location: [facility] OR;  Service: ENT;  Laterality: Right;   TONSILLECTOMY     as a child   TYMPANOPLASTY     around 2008    Social History: Social History   Socioeconomic History   Marital status: Married    Spouse name: Not on file   Number of children: Not on file   Years of education: Not on file   Highest education level: Not on file  Occupational History   Not on file  Tobacco Use   Smoking status: Former    Types: Cigarettes   Smokeless tobacco: Never   Tobacco comments:    Only smoked in high school for about a year. Patient states she  only smoked about 2 cigarettes per day.  Vaping Use   Vaping status: Never Used  Substance and Sexual Activity   Alcohol use: Not Currently   Drug use: No   Sexual activity: Yes    Birth control/protection: None  Other Topics Concern   Not on file  Social History Narrative   Not on file   Social Drivers of Health   Financial Resource Strain: Not on file  Food Insecurity: No Food Insecurity (08/03/2022)   Hunger Vital Sign    Worried About Running Out of Food in the Last Year: Never true    Ran Out of Food in the Last Year: Never true  Transportation Needs: No Transportation Needs (08/03/2022)   PRAPARE - Administrator, Civil Service (Medical): No    Lack of Transportation (Non-Medical): No  Physical Activity: Not on file  Stress: Not on file  Social Connections: Not on file  Intimate Partner Violence: Not At Risk (08/03/2022)   Humiliation, Afraid, Rape, and Kick questionnaire    Fear of Current or Ex-Partner: No    Emotionally Abused: No    Physically Abused: No    Sexually Abused: No    Family History: Family History  Problem Relation Age of Onset   Breast cancer Maternal Grandmother 55   Breast cancer Cousin 80       maternal first cousin    Current Medications:  Current Outpatient Medications:    citalopram  (CELEXA ) 20 MG tablet, Take 1 tablet (20 mg total) by mouth daily., Disp: 90 tablet, Rfl: 3   ferric gluconate (FERRLECIT ) 12.5 MG/ML injection, Every 2 weeks, Disp: , Rfl:    levothyroxine  (SYNTHROID ) 100 MCG tablet, TAKE 1 TABLET(100 MCG) BY MOUTH DAILY BEFORE BREAKFAST, Disp: 90 tablet, Rfl: 3   Ocrelizumab (OCREVUS IV), Inject 600 mg into the vein every 6 (six) months., Disp: , Rfl:    OVER THE COUNTER MEDICATION, Vitamin b12, Disp: , Rfl:    OVER THE COUNTER MEDICATION, Vitamin d3, Disp: , Rfl:    Allergies: Allergies  Allergen Reactions   Sulfa Antibiotics Anaphylaxis   Venofer  [Iron  Sucrose] Hives, Shortness Of Breath and Other (See  Comments)    Also complained of knee, hand, and finger stiffness. Reaction occurred during post-infusion monitoring period. Required IV diphenhydramine . See progress note from 08/07/2022.   Darvocet [Propoxyphene N-Acetaminophen ] Other (See Comments)    Hallucinations    Imitrex [Sumatriptan Base] Other (See Comments)    Lock-jaw   Keflex [Cephalexin] Rash    Childhood allergy   Penicillins Rash    Childhood allergy    REVIEW OF SYSTEMS:   Review of Systems  Constitutional:  Positive for fatigue.  Gastrointestinal:  Positive for constipation.     VITALS:   Last menstrual period 01/22/2023.  Wt Readings from Last 3 Encounters:  09/19/23 227 lb 1.2 oz (103 kg)  07/24/23 228 lb (103.4 kg)  07/11/23 223 lb (101.2 kg)    There is no height or weight on file to calculate BMI.   PHYSICAL EXAM:   Physical Exam  Neurological:     Mental Status: She is alert and oriented to person, place, and time.     LABS:      Latest Ref Rng & Units 10/04/2023   10:15 AM 07/04/2023   11:03 AM 02/07/2023   10:56 PM  CBC  WBC 4.0 - 10.5 K/uL 6.4  5.5  12.4   Hemoglobin 12.0 - 15.0 g/dL 85.8  85.6  88.1   Hematocrit 36.0 - 46.0 % 42.0  42.8  35.8   Platelets 150 - 400 K/uL 244  229  228       Latest Ref Rng & Units 02/07/2023   10:56 PM 01/15/2023   10:44 AM 08/12/2022   11:30 AM  CMP  Glucose 70 - 99 mg/dL 882  72  82   BUN 6 - 20 mg/dL 13  13  13    Creatinine 0.44 - 1.00 mg/dL 9.47  9.22  9.29   Sodium 135 - 145 mmol/L 137  139  136   Potassium 3.5 - 5.1 mmol/L 3.7  3.9  3.6   Chloride 98 - 111  mmol/L 104  105  103   CO2 22 - 32 mmol/L 26  23  26    Calcium 8.9 - 10.3 mg/dL 8.6  8.8  8.9   Total Protein 6.5 - 8.1 g/dL 6.5   7.5   Total Bilirubin 0.3 - 1.2 mg/dL 0.7   0.8   Alkaline Phos 38 - 126 U/L 54   51   AST 15 - 41 U/L 20   14   ALT 0 - 44 U/L 30   14      No results found for: CEA1, CEA / No results found for: CEA1, CEA No results found for: PSA1 No  results found for: CAN199 No results found for: CAN125  No results found for: TOTALPROTELP, ALBUMINELP, A1GS, A2GS, BETS, BETA2SER, GAMS, MSPIKE, SPEI Lab Results  Component Value Date   TIBC 342 10/04/2023   TIBC 386 07/04/2023   TIBC 404 01/10/2023   FERRITIN 69 10/04/2023   FERRITIN 29 07/04/2023   FERRITIN 23 01/10/2023   IRONPCTSAT 27 10/04/2023   IRONPCTSAT 24 07/04/2023   IRONPCTSAT 21 01/10/2023   No results found for: LDH   STUDIES:   No results found.

## 2024-01-26 ENCOUNTER — Other Ambulatory Visit: Payer: Self-pay | Admitting: Medical Genetics

## 2024-01-26 DIAGNOSIS — Z006 Encounter for examination for normal comparison and control in clinical research program: Secondary | ICD-10-CM

## 2024-02-06 ENCOUNTER — Other Ambulatory Visit: Payer: Self-pay

## 2024-02-06 ENCOUNTER — Inpatient Hospital Stay

## 2024-02-06 ENCOUNTER — Emergency Department (HOSPITAL_COMMUNITY)

## 2024-02-06 ENCOUNTER — Emergency Department (HOSPITAL_COMMUNITY)
Admission: EM | Admit: 2024-02-06 | Discharge: 2024-02-06 | Disposition: A | Discharge status: Home / Self Care | Source: Ambulatory Visit | Attending: Emergency Medicine | Admitting: Emergency Medicine

## 2024-02-06 ENCOUNTER — Encounter (HOSPITAL_COMMUNITY): Payer: Self-pay | Admitting: Emergency Medicine

## 2024-02-06 DIAGNOSIS — D649 Anemia, unspecified: Secondary | ICD-10-CM | POA: Diagnosis not present

## 2024-02-06 DIAGNOSIS — G35D Multiple sclerosis, unspecified: Secondary | ICD-10-CM | POA: Insufficient documentation

## 2024-02-06 DIAGNOSIS — M545 Low back pain, unspecified: Secondary | ICD-10-CM | POA: Diagnosis not present

## 2024-02-06 DIAGNOSIS — N3001 Acute cystitis with hematuria: Secondary | ICD-10-CM | POA: Diagnosis not present

## 2024-02-06 DIAGNOSIS — Z79890 Hormone replacement therapy (postmenopausal): Secondary | ICD-10-CM | POA: Insufficient documentation

## 2024-02-06 DIAGNOSIS — N3000 Acute cystitis without hematuria: Secondary | ICD-10-CM

## 2024-02-06 DIAGNOSIS — R319 Hematuria, unspecified: Secondary | ICD-10-CM | POA: Diagnosis present

## 2024-02-06 DIAGNOSIS — R519 Headache, unspecified: Secondary | ICD-10-CM | POA: Insufficient documentation

## 2024-02-06 DIAGNOSIS — E039 Hypothyroidism, unspecified: Secondary | ICD-10-CM | POA: Diagnosis not present

## 2024-02-06 LAB — COMPREHENSIVE METABOLIC PANEL WITH GFR
ALT: 15 U/L (ref 0–44)
AST: 18 U/L (ref 15–41)
Albumin: 4.6 g/dL (ref 3.5–5.0)
Alkaline Phosphatase: 60 U/L (ref 38–126)
Anion gap: 12 (ref 5–15)
BUN: 10 mg/dL (ref 6–20)
CO2: 25 mmol/L (ref 22–32)
Calcium: 8.8 mg/dL — ABNORMAL LOW (ref 8.9–10.3)
Chloride: 103 mmol/L (ref 98–111)
Creatinine, Ser: 0.56 mg/dL (ref 0.44–1.00)
GFR, Estimated: >60 mL/min (ref >60)
Glucose, Bld: 82 mg/dL (ref 70–99)
Potassium: 4 mmol/L (ref 3.5–5.1)
Sodium: 140 mmol/L (ref 135–145)
Total Bilirubin: 0.6 mg/dL (ref 0.0–1.2)
Total Protein: 7 g/dL (ref 6.5–8.1)

## 2024-02-06 LAB — CBC WITH DIFFERENTIAL/PLATELET
Abs Immature Granulocytes: 0.04 K/uL (ref 0.00–0.07)
Basophils Absolute: 0 K/uL (ref 0.0–0.1)
Basophils Relative: 1 %
Eosinophils Absolute: 0.1 K/uL (ref 0.0–0.5)
Eosinophils Relative: 1 %
HCT: 42.6 % (ref 36.0–46.0)
Hemoglobin: 14.3 g/dL (ref 12.0–15.0)
Immature Granulocytes: 1 %
Lymphocytes Relative: 17 %
Lymphs Abs: 1.2 K/uL (ref 0.7–4.0)
MCH: 30.3 pg (ref 26.0–34.0)
MCHC: 33.6 g/dL (ref 30.0–36.0)
MCV: 90.3 fL (ref 80.0–100.0)
Monocytes Absolute: 0.5 K/uL (ref 0.1–1.0)
Monocytes Relative: 7 %
Neutro Abs: 5.5 K/uL (ref 1.7–7.7)
Neutrophils Relative %: 73 %
Platelets: 283 K/uL (ref 150–400)
RBC: 4.72 MIL/uL (ref 3.87–5.11)
RDW: 12.4 % (ref 11.5–15.5)
WBC: 7.4 K/uL (ref 4.0–10.5)
nRBC: 0 % (ref 0.0–0.2)

## 2024-02-06 LAB — URINALYSIS, ROUTINE W REFLEX MICROSCOPIC
Bacteria, UA: NONE SEEN
Bilirubin Urine: NEGATIVE
Glucose, UA: NEGATIVE mg/dL
Hgb urine dipstick: NEGATIVE
Ketones, ur: NEGATIVE mg/dL
Nitrite: NEGATIVE
Protein, ur: NEGATIVE mg/dL
Specific Gravity, Urine: 1.012 (ref 1.005–1.030)
WBC, UA: >50 WBC/hpf (ref 0–5)
pH: 6 (ref 5.0–8.0)

## 2024-02-06 LAB — FERRITIN: Ferritin: 132 ng/mL (ref 11–307)

## 2024-02-06 LAB — IRON AND TIBC
Iron: 92 ug/dL (ref 28–170)
Saturation Ratios: 26 % (ref 10.4–31.8)
TIBC: 356 ug/dL (ref 250–450)
UIBC: 263 ug/dL

## 2024-02-06 MED ORDER — IOHEXOL 300 MG/ML  SOLN
100.0000 mL | Freq: Once | INTRAMUSCULAR | Status: AC | PRN
  Administered 2024-02-06: 100 mL via INTRAVENOUS

## 2024-02-06 MED ORDER — CEPHALEXIN 500 MG PO CAPS: 500.0000 mg | ORAL_CAPSULE | Freq: Two times a day (BID) | ORAL | 0 refills | Status: AC

## 2024-02-06 MED ORDER — CEPHALEXIN 500 MG PO CAPS
500.0000 mg | ORAL_CAPSULE | Freq: Once | ORAL | Status: AC
  Administered 2024-02-06: 500 mg via ORAL
  Filled 2024-02-06: qty 1

## 2024-02-06 NOTE — ED Notes (Signed)
 Patient transported to CT

## 2024-02-06 NOTE — Discharge Instructions (Addendum)
 Follow-up with primary care in the next 48 to 72 hours.  Seek emergency care if experiencing any new or worsening symptoms.  Alternating between 650 mg Tylenol  and 400 mg Advil : The best way to alternate taking Acetaminophen  (example Tylenol ) and Ibuprofen  (example Advil /Motrin ) is to take them 3 hours apart. For example, if you take ibuprofen  at 6 am you can then take Tylenol  at 9 am. You can continue this regimen throughout the day, making sure you do not exceed the recommended maximum dose for each drug.

## 2024-02-06 NOTE — ED Notes (Signed)
 ED Provider at bedside.

## 2024-02-06 NOTE — ED Triage Notes (Signed)
 Pt in for UTI symptoms- back pain and hematuria. Pt was treated for a UTI last week and finished macrobid x2days ago.

## 2024-02-06 NOTE — Telephone Encounter (Signed)
 LOV: 05/16/23 Next appointment: 02/11/24 Last Ocrevus infusion: 01/23/24  S:  Patient developed UTI 2 days after last Ocrevus infusion & treated with antibiotics.  UTI symptoms returned today  B:  ASSESSMENT: 43 y.o. female with Multiple Sclerosis on Ocrevus. She is clinically stable today.    PLAN: - Immune Therapy: continue Ocrevus, continue to check labs while on treatment.    - Imaging: surveillance brain MRIs every 1-2 years as indicated.  Most recent stable 05/2021. Will defer for now as she is doing very well.    - Symptomatic Care/Multidisciplinary Care:             - continue Requip for sensation of restless legs, no refills needed today.              - continue Amantadine for MS related fatigue, no refills needed today.             - continue baclofen for spasticity, no refills needed today.   - Health Maintenance: Following Vitamin D . Goal is above 30 and ideally closer to 50.    - Follow-up: 6 months   A:  Contacted patient and verified name and DOB.  Patient reports developing UTI 2 days after her Ocrevus infusion & went to urgent care for treatment.  They performed UA/ culture and she was prescribed Macrobid, which was susceptible.  The medication seemed to work and her symptoms were better.  However, she woke up this morning with symptoms again.  She has blood in her urine, clots, her urine has an odor and her back is hurting.  Denies any fever or chills.  These are the same symptoms she was having before, so she called her PCP but they do not have any appointments today.  Advised her to go back to urgent care/ seek treatment today.  Discussed concern of leaving this untreated and developing into a kidney infection.  Patient verbalized understanding and states she will go back to the urgent care.  Patient is having some issues with walking related to her MS, but aware this is probably related to illness/ infection and should improve once she receives treatment.  Denies any  additional questions or concerns.  Instructed to call back if any additional needs.  Really appreciates the call.    R:  Patient was advised to go to urgent care/ seek treatment today for urinary symptoms.  Reports some issues with her walking, but aware this should improve once her illness/ infection is treated.    Thank you,  [place], RN

## 2024-02-06 NOTE — ED Provider Notes (Signed)
 [place] EMERGENCY DEPARTMENT AT [facility] Provider Note   CSN: [id number] Arrival date & time: 02/06/24  1202     Patient presents with: Hematuria   Lisa [name] is a 43 y.o. female. Hematuria starting 10/11. Started macrobid last Friday and finished course. Nausea and left sided back pain. Ocravis injection 10/9. Back pain started 2 days ago. Advil  did not help with pain.   Denies fever, chest pain, dyspnea, cough, vomiting, diarrhea, hematochezia.   {Add pertinent medical, surgical, social history, OB history to HPI:32947}  Hematuria       Prior to Admission medications   Medication Sig Start Date End Date Taking? Authorizing Provider  citalopram  (CELEXA ) 20 MG tablet Take 1 tablet (20 mg total) by mouth daily. 07/24/23   Alphonsa Glendia LABOR, MD  ferric gluconate (FERRLECIT ) 12.5 MG/ML injection Every 2 weeks    [provider]  levothyroxine  (SYNTHROID ) 100 MCG tablet TAKE 1 TABLET(100 MCG) BY MOUTH DAILY BEFORE BREAKFAST 09/25/23   Luking, Glendia LABOR, MD  Ocrelizumab (OCREVUS IV) Inject 600 mg into the vein every 6 (six) months.    [provider]  OVER THE COUNTER MEDICATION Vitamin b12    [provider]  OVER THE COUNTER MEDICATION Vitamin d3    [provider]    Allergies: Sulfa antibiotics, Venofer  [iron  sucrose], Darvocet [propoxyphene n-acetaminophen ], Imitrex [sumatriptan base], Keflex [cephalexin], and Penicillins    Review of Systems  Genitourinary:  Positive for hematuria.    Updated Vital Signs BP 126/86   Pulse 69   Temp 98.5 F (36.9 C) (Oral)   Resp 16   Ht 5' 9 (1.753 m)   Wt 104.3 kg   LMP 01/22/2023 (Exact Date)   SpO2 97%   BMI 33.97 kg/m   Physical Exam  (all labs ordered are listed, but only abnormal results are displayed) Labs Reviewed  URINALYSIS, ROUTINE W REFLEX MICROSCOPIC - Abnormal; Notable for the following components:      Result Value   APPearance HAZY (*)     Leukocytes,Ua LARGE (*)    All other components within normal limits    EKG: None  Radiology: No results found.  {Document cardiac monitor, telemetry assessment procedure when appropriate:32947} Procedures   Medications Ordered in the ED - No data to display    {Click here for ABCD2, HEART and other calculators REFRESH Note before signing:1}                              Medical Decision Making Amount and/or Complexity of Data Reviewed Labs: ordered. Radiology: ordered.  Risk Prescription drug management.    This patient presents to the ED for concern of abdominal pain, this involves an extensive number of treatment options, and is a complaint that carries with it a high risk of complications and morbidity.  The differential diagnosis includes gastroenteritis, colitis, small bowel obstruction, appendicitis, cholecystitis, pancreatitis, nephrolithiasis, UTI, pyleonephritis, ***ruptured ectopic pregnancy, PID, ovarian/***testicular torsion.   Co morbidities that complicate the patient evaluation  ***   Additional history obtained:  Additional history obtained from *** External records from outside source obtained and reviewed including ***   Consultations Obtained:  I requested consultation with the ***,  and discussed lab and imaging findings as well as pertinent plan - they recommend: ***   Problem List / ED Course / Critical interventions / Medication management  Patient presented for abdominal pain. On exam patient was *** I  Ordered, and personally interpreted labs.  *** I ordered imaging studies including CT Abd/Pelvis with contrast: evaluate for structural/surgical etiology of patients' severe abdominal pain.  I independently visualized and interpreted imaging and I agree with the radiologist interpretation of ***. I personally viewed and interpreted the EKG/cardiac monitored which showed an underlying rhythm of: *** I have reviewed the patients home medicines  and have made adjustments as needed The patient has been appropriately medically screened and/or stabilized in the ED. I have low suspicion for any other emergent medical condition which would require further screening, evaluation or treatment in the ED or require inpatient management. At time of discharge the patient is hemodynamically stable and in no acute distress. I have discussed work-up results and diagnosis with patient and answered all questions. Patient is agreeable with discharge plan. We discussed strict return precautions for returning to the emergency department and they verbalized understanding.    Social Determinants of Health:  none    {Document critical care time when appropriate  Document review of labs and clinical decision tools ie [id number], etc  Document your independent review of radiology images and any outside records  Document your discussion with family members, caretakers and with consultants  Document social determinants of health affecting pt's care  Document your decision making why or why not admission, treatments were needed:32947:::1}   Final diagnoses:  None    ED Discharge Orders     None

## 2024-02-08 LAB — URINE CULTURE: Culture: 60000 — AB

## 2024-02-09 ENCOUNTER — Telehealth (HOSPITAL_BASED_OUTPATIENT_CLINIC_OR_DEPARTMENT_OTHER): Payer: Self-pay | Admitting: *Deleted

## 2024-02-09 NOTE — Telephone Encounter (Signed)
 Post ED Visit - Positive Culture Follow-up  Culture report reviewed by antimicrobial stewardship pharmacist: [facility] []  Rankin Dee, Pharm.D. []  Venetia Gully, Pharm.D., BCPS AQ-ID []  Garrel Crews, Pharm.D., BCPS []  Almarie Lunger, Pharm.D., BCPS []  [place], [address].D., BCPS, AAHIVP []  Rosaline Bihari, Pharm.D., BCPS, AAHIVP []  Vernell Meier, PharmD, BCPS []  Latanya Hint, PharmD, BCPS []  Donald Medley, PharmD, BCPS []  Rocky Bold, PharmD []  Dorothyann Alert, PharmD, BCPS [x]  Dorn Buttner, PharmD  [facility] []  Rosaline Edison, PharmD []  Romona Bliss, PharmD []  Dolphus Roller, PharmD []  Veva Seip, Rph []  Vernell Daunt) Leonce, PharmD []  Eva Allis, PharmD []  Rosaline Millet, PharmD []  Iantha Batch, PharmD []  Arvin Gauss, PharmD []  Wanda Hasting, PharmD []  Ronal Rav, PharmD []  Rocky Slade, PharmD []  Bard Jeans, PharmD   Positive urine culture Treated with Cephalexin, organism sensitive to the same and no further patient follow-up is required at this time.  Albino Alan Novak 02/09/2024, 3:33 PM

## 2024-02-13 ENCOUNTER — Inpatient Hospital Stay: Attending: Hematology | Admitting: Oncology

## 2024-02-13 DIAGNOSIS — D500 Iron deficiency anemia secondary to blood loss (chronic): Secondary | ICD-10-CM | POA: Diagnosis not present

## 2024-02-13 DIAGNOSIS — G35D Multiple sclerosis, unspecified: Secondary | ICD-10-CM | POA: Diagnosis not present

## 2024-02-13 NOTE — Progress Notes (Signed)
 [facility] OFFICE PROGRESS NOTE  Alphonsa Glendia LABOR, MD  ASSESSMENT & PLAN:   I connected with Lisa [name] on 02/13/24 at  1:15 PM EDT by telephone visit and verified that I am speaking with the correct person using two identifiers.   I discussed the limitations, risks, security and privacy concerns of performing an evaluation and management service by telemedicine and the availability of in-person appointments. I also discussed with the patient that there may be a patient responsible charge related to this service. The patient expressed understanding and agreed to proceed.   Other persons participating in the visit and their role in the encounter: NP, Patient  Patient's location: Home Provider's location: Clinic    Assessment & Plan Iron  deficiency anemia due to chronic blood loss -Labs from 02/06/2024 show more or less stable hemoglobin with improved iron  levels and ferritin.   -She is status post hysterectomy for heavy menstrual bleeding on 01/22/2023. -She does not need any additional iron  at this time and may be discharged from our clinic. -Patient knows she can call clinic with any new concerns. Multiple sclerosis She is currently on Ocrevus and receives this every 6 months. Reports this lowers her immune system and she is sick currently. Continue follow-up with [facility] for her MS.  No orders of the defined types were placed in this encounter.   INTERVAL HISTORY: Patient returns for follow-up for iron  deficiency anemia secondary to heavy menstrual bleeding.  She is status post total hysterectomy.  She denies any additional bleeding.  Reports she is doing well and her energy levels are maintained.  Reports she was recently seen in the ED for hematuria and found to have a UTI.  She was started on Macrobid and recently switched to cephalexin.  No additional hematuria.  We reviewed labs, CBC and CMP.  SUMMARY OF HEMATOLOGIC HISTORY: Oncology History  Overview Note  1.  Severe iron  deficiency anemia from blood loss: - She has heavy menstrual bleeding from fibroids. - Positive for ice pica.  No prior history of transfusion or parenteral iron . - She cannot tolerate oral iron  therapy. - She reports night sweats for the past 3 to 4 months, 3 times per week, drenching type.   2.  Social/family history: - Lives at home with her husband and children.  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  Non-smoker. - MGM: Breast cancer, PGM: Colon cancer, maternal first cousin died of TNBC at 38.   3.  Multiple sclerosis: - Usual symptoms: Numbness on the right side of the body, balance problems and vision issues. - Receives Ocrevus every 6 months for the past 3 years.      No history exists.     CBC    Component Value Date/Time   WBC 7.4 02/06/2024 1308   RBC 4.72 02/06/2024 1308   HGB 14.3 02/06/2024 1308   HGB 11.1 06/18/2022 1006   HCT 42.6 02/06/2024 1308   HCT 36.3 06/18/2022 1006   PLT 283 02/06/2024 1308   PLT 330 06/18/2022 1006   MCV 90.3 02/06/2024 1308   MCV 79 06/18/2022 1006   MCH 30.3 02/06/2024 1308   MCHC 33.6 02/06/2024 1308   RDW 12.4 02/06/2024 1308   RDW 14.3 06/18/2022 1006   LYMPHSABS 1.2 02/06/2024 1308   LYMPHSABS 1.2 06/18/2022 1006   MONOABS 0.5 02/06/2024 1308   EOSABS 0.1 02/06/2024 1308   EOSABS 0.1 06/18/2022 1006   BASOSABS  0.0 02/06/2024 1308   BASOSABS 0.1 06/18/2022 1006       Latest Ref Rng & Units 02/06/2024    1:08 PM 02/07/2023   10:56 PM 01/15/2023   10:44 AM  CMP  Glucose 70 - 99 mg/dL 82  882  72   BUN 6 - 20 mg/dL 10  13  13    Creatinine 0.44 - 1.00 mg/dL 9.43  9.47  9.22   Sodium 135 - 145 mmol/L 140  137  139   Potassium 3.5 - 5.1 mmol/L 4.0  3.7  3.9   Chloride 98 - 111 mmol/L 103  104  105   CO2 22 - 32 mmol/L 25  26  23    Calcium 8.9 - 10.3 mg/dL 8.8  8.6  8.8   Total Protein 6.5 - 8.1 g/dL 7.0  6.5    Total Bilirubin 0.0 - 1.2 mg/dL 0.6   0.7    Alkaline Phos 38 - 126 U/L 60  54    AST 15 - 41 U/L 18  20    ALT 0 - 44 U/L 15  30       Lab Results  Component Value Date   FERRITIN 132 02/06/2024   VITAMINB12 509 07/24/2023    There were no vitals filed for this visit.  Review of System:  Review of Systems  Constitutional:  Positive for malaise/fatigue.  HENT:  Positive for congestion.   Genitourinary:  Positive for hematuria.  Neurological:  Negative for dizziness, weakness and headaches.    Physical Exam: Physical Exam Neurological:     Mental Status: She is alert and oriented to person, place, and time.     I provided 16 minutes of non face-to-face telephone visit time during this encounter, and > 50% was spent counseling as documented under my assessment & plan.   Delon Hope, NP 02/13/2024 2:42 PM

## 2024-02-13 NOTE — Assessment & Plan Note (Addendum)
 She is currently on Ocrevus and receives this every 6 months. Reports this lowers her immune system and she is sick currently. Continue follow-up with [facility] for her MS.

## 2024-02-13 NOTE — Assessment & Plan Note (Addendum)
-  Labs from 02/06/2024 show more or less stable hemoglobin with improved iron  levels and ferritin.   -She is status post hysterectomy for heavy menstrual bleeding on 01/22/2023. -She does not need any additional iron  at this time and may be discharged from our clinic. -Patient knows she can call clinic with any new concerns.

## 2024-02-18 LAB — GENECONNECT MOLECULAR SCREEN: Genetic Analysis Overall Interpretation: NEGATIVE

## 2024-04-13 ENCOUNTER — Encounter: Payer: Self-pay | Admitting: *Deleted

## 2024-06-25 ENCOUNTER — Ambulatory Visit
